# Patient Record
Sex: Male | Born: 1970 | Race: Black or African American | Hispanic: No | Marital: Married | Smoking: Former smoker
Health system: Southern US, Community
[De-identification: ages and names within clinical notes are randomized; demographics above are authoritative.]

## PROBLEM LIST (undated history)

## (undated) DIAGNOSIS — M545 Low back pain, unspecified: Secondary | ICD-10-CM

## (undated) DIAGNOSIS — I10 Essential (primary) hypertension: Secondary | ICD-10-CM

## (undated) DIAGNOSIS — M1990 Unspecified osteoarthritis, unspecified site: Secondary | ICD-10-CM

## (undated) HISTORY — DX: Low back pain, unspecified: M54.50

## (undated) HISTORY — DX: Unspecified osteoarthritis, unspecified site: M19.90

---

## 1998-05-26 ENCOUNTER — Emergency Department (HOSPITAL_COMMUNITY): Admission: EM | Admit: 1998-05-26 | Discharge: 1998-05-26 | Payer: Self-pay | Admitting: Family Medicine

## 2005-02-05 ENCOUNTER — Emergency Department (HOSPITAL_COMMUNITY): Admission: EM | Admit: 2005-02-05 | Discharge: 2005-02-05 | Payer: Self-pay | Admitting: Emergency Medicine

## 2006-09-06 ENCOUNTER — Emergency Department (HOSPITAL_COMMUNITY): Admission: EM | Admit: 2006-09-06 | Discharge: 2006-09-06 | Payer: Self-pay | Admitting: Emergency Medicine

## 2009-02-07 ENCOUNTER — Emergency Department (HOSPITAL_COMMUNITY): Admission: EM | Admit: 2009-02-07 | Discharge: 2009-02-07 | Payer: Self-pay | Admitting: Family Medicine

## 2012-04-05 ENCOUNTER — Emergency Department (HOSPITAL_COMMUNITY)
Admission: EM | Admit: 2012-04-05 | Discharge: 2012-04-05 | Disposition: A | Discharge status: Home / Self Care | Payer: Self-pay | Attending: Emergency Medicine | Admitting: Emergency Medicine

## 2012-04-05 ENCOUNTER — Encounter (HOSPITAL_COMMUNITY): Payer: Self-pay | Admitting: Emergency Medicine

## 2012-04-05 ENCOUNTER — Emergency Department (HOSPITAL_COMMUNITY): Payer: Self-pay

## 2012-04-05 DIAGNOSIS — S39012A Strain of muscle, fascia and tendon of lower back, initial encounter: Secondary | ICD-10-CM

## 2012-04-05 DIAGNOSIS — F17200 Nicotine dependence, unspecified, uncomplicated: Secondary | ICD-10-CM | POA: Insufficient documentation

## 2012-04-05 DIAGNOSIS — M545 Low back pain, unspecified: Secondary | ICD-10-CM | POA: Insufficient documentation

## 2012-04-05 DIAGNOSIS — S335XXA Sprain of ligaments of lumbar spine, initial encounter: Secondary | ICD-10-CM | POA: Insufficient documentation

## 2012-04-05 DIAGNOSIS — X58XXXA Exposure to other specified factors, initial encounter: Secondary | ICD-10-CM | POA: Insufficient documentation

## 2012-04-05 MED ORDER — IBUPROFEN 600 MG PO TABS: 600.0000 mg | ORAL_TABLET | Freq: Four times a day (QID) | ORAL | Status: AC | PRN

## 2012-04-05 MED ORDER — HYDROCODONE-ACETAMINOPHEN 5-500 MG PO TABS: 1.0000 | ORAL_TABLET | Freq: Four times a day (QID) | ORAL | Status: AC | PRN

## 2012-04-05 MED ORDER — IBUPROFEN 200 MG PO TABS
600.0000 mg | ORAL_TABLET | Freq: Once | ORAL | Status: AC
  Administered 2012-04-05: 600 mg via ORAL
  Filled 2012-04-05: qty 3

## 2012-04-05 NOTE — ED Notes (Signed)
Pt c/o of lower back. States that he was going down the stairs when he dropped his keys, bent over to pick them up and heard a loud pop. Took tylenol with no relief.

## 2012-04-05 NOTE — Discharge Instructions (Signed)
Take motrin as need.  You may take vicodin as need for pain. No driving when taking vicodin. Also, do not take tylenol or acetaminophen containing medication when taking vicodin. Avoid bending at waist or heavy lifting more than 20 lbs for the next week. Try heat/heating pad to sore area. Follow up with primary care doctor in coming week for recheck - discuss referral to back specialist and/or further workup if symptoms fail to improve/resolve. Return to ER if worse, leg numbness/weakness, intractable pain, other concern.     Back Pain, Adult Low back pain is very common. About 1 in 5 people have back pain.The cause of low back pain is rarely dangerous. The pain often gets better over time.About half of people with a sudden onset of back pain feel better in just 2 weeks. About 8 in 10 people feel better by 6 weeks.  CAUSES Some common causes of back pain include:  Strain of the muscles or ligaments supporting the spine.   Wear and tear (degeneration) of the spinal discs.   Arthritis.   Direct injury to the back.  DIAGNOSIS Most of the time, the direct cause of low back pain is not known.However, back pain can be treated effectively even when the exact cause of the pain is unknown.Answering your caregiver's questions about your overall health and symptoms is one of the most accurate ways to make sure the cause of your pain is not dangerous. If your caregiver needs more information, he or she may order lab work or imaging tests (X-rays or MRIs).However, even if imaging tests show changes in your back, this usually does not require surgery. HOME CARE INSTRUCTIONS For many people, back pain returns.Since low back pain is rarely dangerous, it is often a condition that people can learn to [facility] their own.   Remain active. It is stressful on the back to sit or stand in one place. Do not sit, drive, or stand in one place for more than 30 minutes at a time. Take short walks on level surfaces  as soon as pain allows.Try to increase the length of time you walk each day.   Do not stay in bed.Resting more than 1 or 2 days can delay your recovery.   Do not avoid exercise or work.Your body is made to move.It is not dangerous to be active, even though your back may hurt.Your back will likely heal faster if you return to being active before your pain is gone.   Pay attention to your body when you bend and lift. Many people have less discomfortwhen lifting if they bend their knees, keep the load close to their bodies,and avoid twisting. Often, the most comfortable positions are those that put less stress on your recovering back.   Find a comfortable position to sleep. Use a firm mattress and lie on your side with your knees slightly bent. If you lie on your back, put a pillow under your knees.   Only take over-the-counter or prescription medicines as directed by your caregiver. Over-the-counter medicines to reduce pain and inflammation are often the most helpful.Your caregiver may prescribe muscle relaxant drugs.These medicines help dull your pain so you can more quickly return to your normal activities and healthy exercise.   Put ice on the injured area.   Put ice in a plastic bag.   Place a towel between your skin and the bag.   Leave the ice on for 15 to 20 minutes, 3 to 4 times a day for the first  2 to 3 days. After that, ice and heat may be alternated to reduce pain and spasms.   Ask your caregiver about trying back exercises and gentle massage. This may be of some benefit.   Avoid feeling anxious or stressed.Stress increases muscle tension and can worsen back pain.It is important to recognize when you are anxious or stressed and learn ways to manage it.Exercise is a great option.  SEEK MEDICAL CARE IF:  You have pain that is not relieved with rest or medicine.   You have pain that does not improve in 1 week.   You have new symptoms.   You are generally not feeling  well.  SEEK IMMEDIATE MEDICAL CARE IF:   You have pain that radiates from your back into your legs.   You develop new bowel or bladder control problems.   You have unusual weakness or numbness in your arms or legs.   You develop nausea or vomiting.   You develop abdominal pain.   You feel faint.  Document Released: 09/28/2005 Document Revised: 09/17/2011 Document Reviewed: 02/16/2011 [facility] Patient Information 2012 [place], [place].      Lumbosacral Strain Lumbosacral strain is one of the most common causes of back pain. There are many causes of back pain. Most are not serious conditions. CAUSES  Your backbone (spinal column) is made up of 24 main vertebral bodies, the sacrum, and the coccyx. These are held together by muscles and tough, fibrous tissue (ligaments). Nerve roots pass through the openings between the vertebrae. A sudden move or injury to the back may cause injury to, or pressure on, these nerves. This may result in localized back pain or pain movement (radiation) into the buttocks, down the leg, and into the foot. Sharp, shooting pain from the buttock down the back of the leg (sciatica) is frequently associated with a ruptured (herniated) disk. Pain may be caused by muscle spasm alone. Your caregiver can often find the cause of your pain by the details of your symptoms and an exam. In some cases, you may need tests (such as X-rays). Your caregiver will work with you to decide if any tests are needed based on your specific exam. HOME CARE INSTRUCTIONS   Avoid an underactive lifestyle. Active exercise, as directed by your caregiver, is your greatest weapon against back pain.   Avoid hard physical activities (tennis, racquetball, waterskiing) if you are not in proper physical condition for it. This may aggravate or create problems.   If you have a back problem, avoid sports requiring sudden body movements. Swimming and walking are generally safer activities.   Maintain  good posture.   Avoid becoming overweight (obese).   Use bed rest for only the most extreme, sudden (acute) episode. Your caregiver will help you determine how much bed rest is necessary.   For acute conditions, you may put ice on the injured area.   Put ice in a plastic bag.   Place a towel between your skin and the bag.   Leave the ice on for 15 to 20 minutes at a time, every 2 hours, or as needed.   After you are improved and more active, it may help to apply heat for 30 minutes before activities.  See your caregiver if you are having pain that lasts longer than expected. Your caregiver can advise appropriate exercises or therapy if needed. With conditioning, most back problems can be avoided. SEEK IMMEDIATE MEDICAL CARE IF:   You have numbness, tingling, weakness, or problems with the  use of your arms or legs.   You experience severe back pain not relieved with medicines.   There is a change in bowel or bladder control.   You have increasing pain in any area of the body, including your belly (abdomen).   You notice shortness of breath, dizziness, or feel faint.   You feel sick to your stomach (nauseous), are throwing up (vomiting), or become sweaty.   You notice discoloration of your toes or legs, or your feet get very cold.   Your back pain is getting worse.   You have a fever.  MAKE SURE YOU:   Understand these instructions.   Will watch your condition.   Will get help right away if you are not doing well or get worse.  Document Released: 07/08/2005 Document Revised: 09/17/2011 Document Reviewed: 12/28/2008 [facility] Patient Information 2012 [place], [place].     Degenerative Disc Disease Degenerative disc disease is a condition caused by the changes that occur in the cushions of the backbone (spinal discs) as you grow older. Spinal discs are soft and compressible discs located between the bones of the spine (vertebrae). They act like shock absorbers. Degenerative  disc disease can affect the wholespine. However, the neck and lower back are most commonly affected. Many changes can occur in the spinal discs with aging, such as:  The spinal discs may dry and shrink.   Small tears may occur in the tough, outer covering of the disc (annulus).   The disc space may become smaller due to loss of water.   Abnormal growths in the bone (spurs) may occur. This can put pressure on the nerve roots exiting the spinal canal, causing pain.   The spinal canal may become narrowed.  CAUSES  Degenerative disc disease is a condition caused by the changes that occur in the spinal discs with aging. The exact cause is not known, but there is a genetic basis for many patients. Degenerative changes can occur due to loss of fluid in the disc. This makes the disc thinner and reduces the space between the backbones. Small cracks can develop in the outer layer of the disc. This can lead to the breakdown of the disc. You are more likely to get degenerative disc disease if you are overweight. Smoking cigarettes and doing heavy work such as weightlifting can also increase your risk of this condition. Degenerative changes can start after a sudden injury. Growth of bone spurs can compress the nerve roots and cause pain.  SYMPTOMS  The symptoms vary from person to person. Some people may have no pain, while others have severe pain. The pain may be so severe that it can limit your activities. The location of the pain depends on the part of your backbone that is affected. You will have neck or arm pain if a disc in the neck area is affected. You will have pain in your back, buttocks, or legs if a disc in the lower back is affected. The pain becomes worse while bending, reaching up, or with twisting movements. The pain may start gradually and then get worse as time passes. It may also start after a major or minor injury. You may feel numbness or tingling in the arms or legs.  DIAGNOSIS  Your  caregiver will ask you about your symptoms and about activities or habits that may cause the pain. He or she may also ask about any injuries, diseases, ortreatments you have had earlier. Your caregiver will examine you to check for  the range of movement that is possible in the affected area, to check for strength in your extremities, and to check for sensation in the areas of the arms and legs supplied by different nerve roots. An X-ray of the spine may be taken. Your caregiver may suggest other imaging tests, such as a computerized magnetic scan (MRI), if needed.  TREATMENT  Treatment includes rest, modifying your activities, and applying ice and heat. Your caregiver may prescribe medicines to reduce your pain and may ask you to do some exercises to strengthen your back. In some cases, you may need surgery. You and your caregiver will decide on the treatment that is best for you. HOME CARE INSTRUCTIONS   Follow proper lifting and walking techniques as advised by your caregiver.   Maintain good posture.   Exercise regularly as advised.   Perform relaxation exercises.   Change your sitting, standing, and sleeping habits as advised. Change positions frequently.   Lose weight as advised.   Stop smoking if you smoke.   Wear supportive footwear.  SEEK MEDICAL CARE IF:  The pain does not go away within 1 to 4 weeks. SEEK IMMEDIATE MEDICAL CARE IF:   The pain is severe.   You notice weakness in your arms, hands, or legs.   You begin to lose control of your bladder or bowel.  MAKE SURE YOU:   Understand these instructions.   Will watch your condition.   Will get help right away if you are not doing well or get worse.  Document Released: 07/26/2007 Document Revised: 09/17/2011 Document Reviewed: 07/26/2007 [facility] Patient Information 2012 [place], [place].

## 2012-04-05 NOTE — ED Provider Notes (Signed)
History     CSN: [id number]  Arrival date & time 04/05/12  7829   First MD Initiated Contact with Patient 04/05/12 [id number]      Chief Complaint  Patient presents with  . Back Pain    (Consider location/radiation/quality/duration/timing/severity/associated sxs/prior treatment) Patient is a 41 y.o. male presenting with back pain. The history is provided by the patient.  Back Pain  Pertinent negatives include no fever, no numbness and no weakness.  pt c/o low back pain. Constant, dull, non radiating. Worse w bending/turning movements. States he bent over yesterday to pick up something from floor, felt a pop/pull in low back, acute onset low back pain. Hx intermittent low back pain in past but denies chronic back pain. No hx ddd. No radicular pain or leg pain. No numbness or weakness. Ambulatory since. No fever or chills. No gi or gu c/o.   History reviewed. No pertinent past medical history.  History reviewed. No pertinent past surgical history.  History reviewed. No pertinent family history.  History  Substance Use Topics  . Smoking status: Current Everyday Smoker    Types: Cigarettes  . Smokeless tobacco: Not on file  . Alcohol Use: Yes      Review of Systems  Constitutional: Negative for fever.  HENT: Negative for neck pain.   Musculoskeletal: Positive for back pain.  Neurological: Negative for weakness and numbness.    Allergies  Review of patient's allergies indicates no known allergies.  Home Medications   Current Outpatient Rx  Name Route Sig Dispense Refill  . MUSCLE RUB 10-15 % EX CREA Topical Apply 1 application topically as needed. Apply to affected areas on back as needed      BP 124/94  Pulse 87  Temp 97.8 F (36.6 C) (Oral)  SpO2 100%  Physical Exam  Nursing note and vitals reviewed. Constitutional: He is oriented to person, place, and time. He appears well-developed and well-nourished. No distress.  HENT:  Head: Atraumatic.  Eyes: Conjunctivae  are normal.  Neck: Neck supple. No tracheal deviation present.  Cardiovascular: Normal rate.   Pulmonary/Chest: Effort normal. No accessory muscle usage. No respiratory distress.  Abdominal: Soft. He exhibits no distension and no mass. There is no tenderness.  Genitourinary:       No cva tenderness  Musculoskeletal: Normal range of motion. He exhibits no edema and no tenderness.       Diffuse lumbar tenderness, otherwise CTLS spine, non tender, aligned, no step off.   Neurological: He is alert and oriented to person, place, and time. He displays normal reflexes.       Motor intact bil. Steady gait.   Skin: Skin is warm and dry.  Psychiatric: He has a normal mood and affect.    ED Course  Procedures (including critical care time)   Dg Lumbar Spine Complete  04/05/2012  *RADIOLOGY REPORT*  Clinical Data: Low back pain right leg pain  LUMBAR SPINE - COMPLETE 4+ VIEW  Comparison: None.  Findings: Five views of the lumbar spine submitted.  No acute fracture or subluxation.  Alignment and vertebral height are preserved.  Mild disc space flattening at L4-L5 and L5 S1 level.  IMPRESSION: No acute fracture or subluxation.  Mild disc space flattening at L4- L5 and L5 S1 level.  Original Report Authenticated By: Natasha Mead, M.D.      MDM  Pt drove self. Motrin po.   Discussed xrays w pt.   Pt comfortable on recheck.       Caryn Bee  Cheri Rous, MD 04/05/12 [phone]

## 2015-09-17 ENCOUNTER — Encounter (HOSPITAL_COMMUNITY): Payer: Self-pay

## 2015-09-17 ENCOUNTER — Emergency Department (HOSPITAL_COMMUNITY): Payer: Self-pay

## 2015-09-17 ENCOUNTER — Emergency Department (HOSPITAL_COMMUNITY)
Admission: EM | Admit: 2015-09-17 | Discharge: 2015-09-17 | Disposition: A | Discharge status: Home / Self Care | Payer: Self-pay | Attending: Emergency Medicine | Admitting: Emergency Medicine

## 2015-09-17 DIAGNOSIS — F17210 Nicotine dependence, cigarettes, uncomplicated: Secondary | ICD-10-CM | POA: Insufficient documentation

## 2015-09-17 DIAGNOSIS — M549 Dorsalgia, unspecified: Secondary | ICD-10-CM | POA: Insufficient documentation

## 2015-09-17 DIAGNOSIS — R509 Fever, unspecified: Secondary | ICD-10-CM | POA: Insufficient documentation

## 2015-09-17 DIAGNOSIS — R1012 Left upper quadrant pain: Secondary | ICD-10-CM | POA: Insufficient documentation

## 2015-09-17 LAB — URINALYSIS, ROUTINE W REFLEX MICROSCOPIC
BILIRUBIN URINE: NEGATIVE
GLUCOSE, UA: NEGATIVE mg/dL
KETONES UR: NEGATIVE mg/dL
LEUKOCYTES UA: NEGATIVE
NITRITE: NEGATIVE
PH: 7 (ref 5.0–8.0)
PROTEIN: NEGATIVE mg/dL
Specific Gravity, Urine: 1.012 (ref 1.005–1.030)

## 2015-09-17 LAB — COMPREHENSIVE METABOLIC PANEL
ALBUMIN: 4.7 g/dL (ref 3.5–5.0)
ALK PHOS: 63 U/L (ref 38–126)
ALT: 15 U/L — AB (ref 17–63)
AST: 20 U/L (ref 15–41)
Anion gap: 6 (ref 5–15)
BILIRUBIN TOTAL: 0.8 mg/dL (ref 0.3–1.2)
BUN: 12 mg/dL (ref 6–20)
CO2: 29 mmol/L (ref 22–32)
CREATININE: 0.95 mg/dL (ref 0.61–1.24)
Calcium: 9.7 mg/dL (ref 8.9–10.3)
Chloride: 103 mmol/L (ref 101–111)
GFR calc Af Amer: >60 mL/min (ref >60)
GLUCOSE: 103 mg/dL — AB (ref 65–99)
Potassium: 4.4 mmol/L (ref 3.5–5.1)
Sodium: 138 mmol/L (ref 135–145)
TOTAL PROTEIN: 7.8 g/dL (ref 6.5–8.1)

## 2015-09-17 LAB — CBC
HCT: 42 % (ref 39.0–52.0)
Hemoglobin: 14.5 g/dL (ref 13.0–17.0)
MCH: 32.6 pg (ref 26.0–34.0)
MCHC: 34.5 g/dL (ref 30.0–36.0)
MCV: 94.4 fL (ref 78.0–100.0)
PLATELETS: 287 10*3/uL (ref 150–400)
RBC: 4.45 MIL/uL (ref 4.22–5.81)
RDW: 12.7 % (ref 11.5–15.5)
WBC: 5 10*3/uL (ref 4.0–10.5)

## 2015-09-17 LAB — URINE MICROSCOPIC-ADD ON

## 2015-09-17 LAB — LIPASE, BLOOD: Lipase: 21 U/L (ref 11–51)

## 2015-09-17 MED ORDER — CIPROFLOXACIN HCL 500 MG PO TABS: 500.0000 mg | ORAL_TABLET | Freq: Two times a day (BID) | ORAL | Status: DC

## 2015-09-17 MED ORDER — HYDROMORPHONE HCL 1 MG/ML IJ SOLN
1.0000 mg | Freq: Once | INTRAMUSCULAR | Status: AC
  Administered 2015-09-17: 1 mg via INTRAVENOUS
  Filled 2015-09-17: qty 1

## 2015-09-17 MED ORDER — TRAMADOL HCL 50 MG PO TABS: 50.0000 mg | ORAL_TABLET | Freq: Four times a day (QID) | ORAL | Status: DC | PRN

## 2015-09-17 NOTE — Progress Notes (Addendum)
CM spoke with [place] who [place].  CM discussed and provided written information for uninsured accepting pcps, discussed the importance of pcp vs EDP services for f/u care, www.needymeds.org, www.goodrx.com, discounted pharmacies and other [place] resources such as [facility] , [facility], affordable care act, financial assistance, uninsured dental services, [place] med assist, DSS and  health department  Reviewed resources for [place] uninsured accepting pcps like [facility], [facility] medicine [facility] [address], [facility], [facility], local urgent care centers, [facility], [facility], general medical clinics, [facility], [facility] plus others, medication resources, [facility] out patient pharmacies and housing Pt voiced understanding and appreciation of resources provided   Provided [facility] contact information Left items with male family member with a male baby at the bedside Brother Reggie came to visit and left   Entered in d/c instructions  Please use the resources provided to you in emergency room by case manager to assist with doctor for follow up As needed These [place] uninsured resources provide possible primary care providers, resources for discounted medications, housing, dental resources, affordable care act information, plus other resources for [place]

## 2015-09-17 NOTE — ED Notes (Signed)
Patient c/o upper abd pain that radiates to the back x3 weeks.  Patient states that HA pain began 2 days ago and brought patient in today.  Patient states has been having night sweats and fevers at night x3 days, denies cough/cold symptoms.

## 2015-09-17 NOTE — ED Notes (Signed)
Pt with abdominal pain to left lower abdomen radiating to center back.  No change in urination.  Headache.  No vomiting.  Unknown for fever.  Pain x 3 weeks but worsening in last few days.

## 2015-09-17 NOTE — ED Notes (Signed)
Patient d/c'd self care.  F/U and medications reviewed.  Patient verbalized understanding. 

## 2015-09-17 NOTE — ED Provider Notes (Signed)
CSN: [id number]     Arrival date & time 09/17/15  1123 History   First MD Initiated Contact with Patient 09/17/15 1228     Chief Complaint  Patient presents with  . Abdominal Pain  . Back Pain    Patient is a 44 y.o. male presenting with abdominal pain and back pain. The history is provided by the patient.  Abdominal Pain Pain location:  LUQ Pain quality: aching and sharp   Pain radiates to:  L flank Pain severity:  Moderate Onset quality:  Gradual Duration:  3 weeks Progression:  Worsening Chronicity:  New Relieved by:  Nothing Worsened by:  Nothing tried Associated symptoms: chills and fever   Associated symptoms: no anorexia, no constipation, no cough, no diarrhea, no dysuria and no vomiting   Back Pain Associated symptoms: abdominal pain and fever   Associated symptoms: no dysuria     History reviewed. No pertinent past medical history. History reviewed. No pertinent past surgical history. History reviewed. No pertinent family history. Social History  Substance Use Topics  . Smoking status: Current Every Day Smoker    Types: Cigarettes  . Smokeless tobacco: None  . Alcohol Use: Yes     Comment: social    Review of Systems  Constitutional: Positive for fever and chills. Negative for unexpected weight change.  Respiratory: Negative for cough.   Gastrointestinal: Positive for abdominal pain. Negative for vomiting, diarrhea, constipation and anorexia.  Genitourinary: Negative for dysuria.  Musculoskeletal: Positive for back pain.  All other systems reviewed and are negative.     Allergies  Review of patient's allergies indicates no known allergies.  Home Medications   Prior to Admission medications   Medication Sig Start Date End Date Taking? Authorizing Provider  ciprofloxacin (CIPRO) 500 MG tablet Take 1 tablet (500 mg total) by mouth every 12 (twelve) hours. 09/17/15   Linwood Dibbles, MD  Menthol-Methyl Salicylate (MUSCLE RUB) 10-15 % CREA Apply 1 application  topically as needed. Apply to affected areas on back as needed    Historical Provider, MD  traMADol (ULTRAM) 50 MG tablet Take 1 tablet (50 mg total) by mouth every 6 (six) hours as needed. 09/17/15   Linwood Dibbles, MD   BP 155/96 mmHg  Pulse 54  Temp(Src) 98.2 F (36.8 C) (Oral)  Resp 17  SpO2 97% Physical Exam  Constitutional: He appears well-developed and well-nourished. No distress.  HENT:  Head: Normocephalic and atraumatic.  Right Ear: External ear normal.  Left Ear: External ear normal.  Eyes: Conjunctivae are normal. Right eye exhibits no discharge. Left eye exhibits no discharge. No scleral icterus.  Neck: Neck supple. No tracheal deviation present.  Cardiovascular: Normal rate, regular rhythm and intact distal pulses.   Pulmonary/Chest: Effort normal and breath sounds normal. No stridor. No respiratory distress. He has no wheezes. He has no rales.  Abdominal: Soft. Bowel sounds are normal. He exhibits no distension. There is tenderness. There is CVA tenderness. There is no rebound and no guarding.  Musculoskeletal: He exhibits no edema or tenderness.  Neurological: He is alert. He has normal strength. No cranial nerve deficit (no facial droop, extraocular movements intact, no slurred speech) or sensory deficit. He exhibits normal muscle tone. He displays no seizure activity. Coordination normal.  Skin: Skin is warm and dry. No rash noted.  Psychiatric: He has a normal mood and affect.  Nursing note and vitals reviewed.   ED Course  Procedures (including critical care time) Labs Review Labs Reviewed  COMPREHENSIVE METABOLIC PANEL -  Abnormal; Notable for the following:    Glucose, Bld 103 (*)    ALT 15 (*)    All other components within normal limits  URINALYSIS, ROUTINE W REFLEX MICROSCOPIC (NOT AT [facility]) - Abnormal; Notable for the following:    Hgb urine dipstick SMALL (*)    All other components within normal limits  URINE MICROSCOPIC-ADD ON - Abnormal; Notable for the  following:    Squamous Epithelial / LPF 0-5 (*)    Bacteria, UA RARE (*)    All other components within normal limits  URINE CULTURE  LIPASE, BLOOD  CBC    Imaging Review Ct Abdomen Pelvis Wo Contrast  09/17/2015  CLINICAL DATA:  Upper abdominal pain for 3 weeks EXAM: CT ABDOMEN AND PELVIS WITHOUT CONTRAST TECHNIQUE: Multidetector CT imaging of the abdomen and pelvis was performed following the standard protocol without IV contrast. COMPARISON:  None. FINDINGS: Lung bases are free of acute infiltrate or sizable effusion. The liver, gallbladder, spleen, adrenal glands and pancreas are within normal limits. The kidneys are well visualized bilaterally. No renal calculi or obstructive changes are seen. A small 1 cm hypodensity is noted within the left kidney consistent with a renal cyst. The appendix is well visualized. The bladder is well distended. No pelvic mass lesion or free pelvic fluid is seen. No significant lymphadenopathy is noted. The bony structures are within normal limits. IMPRESSION: Small left renal cyst.  No other focal abnormality is noted. Electronically Signed   By: Alcide CleverMark  Lukens M.D.   On: 09/17/2015 14:17   I have personally reviewed and evaluated these images and lab results as part of my medical decision-making.    MDM   Final diagnoses:  Left upper quadrant pain    Pt has no acute findings on CT scan to account for his pain.  Labs are normal with the exception of mild abnormalities in the urine.  Not definitive for infection.  However with absence of other findings will rx abx, pain meds.  Follow up with a PCP    Linwood DibblesJon Carey Lafon, MD 09/17/15 [phone]

## 2015-09-17 NOTE — Discharge Instructions (Signed)

## 2015-09-17 NOTE — ED Notes (Signed)
Patient transported to CT 

## 2015-09-19 LAB — URINE CULTURE
Culture: NO GROWTH
SPECIAL REQUESTS: NORMAL

## 2016-02-11 ENCOUNTER — Encounter (HOSPITAL_COMMUNITY): Payer: Self-pay | Admitting: Emergency Medicine

## 2016-02-11 ENCOUNTER — Emergency Department (HOSPITAL_COMMUNITY)
Admission: EM | Admit: 2016-02-11 | Discharge: 2016-02-11 | Disposition: A | Discharge status: Home / Self Care | Payer: Self-pay | Attending: Emergency Medicine | Admitting: Emergency Medicine

## 2016-02-11 ENCOUNTER — Emergency Department (HOSPITAL_COMMUNITY): Payer: Self-pay

## 2016-02-11 DIAGNOSIS — R319 Hematuria, unspecified: Secondary | ICD-10-CM | POA: Insufficient documentation

## 2016-02-11 DIAGNOSIS — F17210 Nicotine dependence, cigarettes, uncomplicated: Secondary | ICD-10-CM | POA: Insufficient documentation

## 2016-02-11 DIAGNOSIS — R1084 Generalized abdominal pain: Secondary | ICD-10-CM | POA: Insufficient documentation

## 2016-02-11 DIAGNOSIS — M549 Dorsalgia, unspecified: Secondary | ICD-10-CM | POA: Insufficient documentation

## 2016-02-11 LAB — CBC WITH DIFFERENTIAL/PLATELET
Basophils Absolute: 0 10*3/uL (ref 0.0–0.1)
Basophils Relative: 1 %
EOS PCT: 2 %
Eosinophils Absolute: 0.1 10*3/uL (ref 0.0–0.7)
HEMATOCRIT: 42.5 % (ref 39.0–52.0)
Hemoglobin: 14.2 g/dL (ref 13.0–17.0)
LYMPHS ABS: 1.3 10*3/uL (ref 0.7–4.0)
LYMPHS PCT: 27 %
MCH: 32.1 pg (ref 26.0–34.0)
MCHC: 33.4 g/dL (ref 30.0–36.0)
MCV: 95.9 fL (ref 78.0–100.0)
MONO ABS: 0.4 10*3/uL (ref 0.1–1.0)
Monocytes Relative: 8 %
NEUTROS ABS: 2.9 10*3/uL (ref 1.7–7.7)
Neutrophils Relative %: 62 %
PLATELETS: 269 10*3/uL (ref 150–400)
RBC: 4.43 MIL/uL (ref 4.22–5.81)
RDW: 12.4 % (ref 11.5–15.5)
WBC: 4.6 10*3/uL (ref 4.0–10.5)

## 2016-02-11 LAB — COMPREHENSIVE METABOLIC PANEL
ALBUMIN: 4.1 g/dL (ref 3.5–5.0)
ALT: 15 U/L — ABNORMAL LOW (ref 17–63)
AST: 21 U/L (ref 15–41)
Alkaline Phosphatase: 54 U/L (ref 38–126)
Anion gap: 8 (ref 5–15)
BUN: 12 mg/dL (ref 6–20)
CHLORIDE: 105 mmol/L (ref 101–111)
CO2: 25 mmol/L (ref 22–32)
Calcium: 9.4 mg/dL (ref 8.9–10.3)
Creatinine, Ser: 0.84 mg/dL (ref 0.61–1.24)
GFR calc Af Amer: >60 mL/min (ref >60)
GFR calc non Af Amer: >60 mL/min (ref >60)
GLUCOSE: 98 mg/dL (ref 65–99)
POTASSIUM: 4.4 mmol/L (ref 3.5–5.1)
Sodium: 138 mmol/L (ref 135–145)
Total Bilirubin: 0.9 mg/dL (ref 0.3–1.2)
Total Protein: 6.9 g/dL (ref 6.5–8.1)

## 2016-02-11 LAB — URINE MICROSCOPIC-ADD ON: WBC UA: NONE SEEN WBC/hpf (ref 0–5)

## 2016-02-11 LAB — URINALYSIS, ROUTINE W REFLEX MICROSCOPIC
Bilirubin Urine: NEGATIVE
GLUCOSE, UA: NEGATIVE mg/dL
Ketones, ur: NEGATIVE mg/dL
Leukocytes, UA: NEGATIVE
Nitrite: NEGATIVE
PH: 6.5 (ref 5.0–8.0)
PROTEIN: NEGATIVE mg/dL
SPECIFIC GRAVITY, URINE: 1.014 (ref 1.005–1.030)

## 2016-02-11 LAB — LIPASE, BLOOD: Lipase: 16 U/L (ref 11–51)

## 2016-02-11 MED ORDER — PROMETHAZINE HCL 25 MG PO TABS: 25.0000 mg | ORAL_TABLET | Freq: Four times a day (QID) | ORAL | Status: DC | PRN

## 2016-02-11 MED ORDER — IOPAMIDOL (ISOVUE-300) INJECTION 61%
INTRAVENOUS | Status: AC
  Filled 2016-02-11: qty 100

## 2016-02-11 MED ORDER — IOPAMIDOL (ISOVUE-300) INJECTION 61%
INTRAVENOUS | Status: AC
  Administered 2016-02-11: 100 mL
  Filled 2016-02-11: qty 100

## 2016-02-11 MED ORDER — HYDROMORPHONE HCL 1 MG/ML IJ SOLN
1.0000 mg | Freq: Once | INTRAMUSCULAR | Status: AC
Start: 2016-02-11 — End: 2016-02-11
  Administered 2016-02-11: 1 mg via INTRAVENOUS
  Filled 2016-02-11: qty 1

## 2016-02-11 MED ORDER — HYDROCODONE-ACETAMINOPHEN 5-325 MG PO TABS: 1.0000 | ORAL_TABLET | ORAL | Status: DC | PRN

## 2016-02-11 MED ORDER — MORPHINE SULFATE (PF) 4 MG/ML IV SOLN
4.0000 mg | Freq: Once | INTRAVENOUS | Status: AC
  Administered 2016-02-11: 4 mg via INTRAVENOUS
  Filled 2016-02-11: qty 1

## 2016-02-11 MED ORDER — SODIUM CHLORIDE 0.9 % IV BOLUS (SEPSIS)
1000.0000 mL | Freq: Once | INTRAVENOUS | Status: AC
  Administered 2016-02-11: 1000 mL via INTRAVENOUS

## 2016-02-11 MED ORDER — ONDANSETRON HCL 4 MG/2ML IJ SOLN
4.0000 mg | Freq: Once | INTRAMUSCULAR | Status: AC
  Administered 2016-02-11: 4 mg via INTRAVENOUS
  Filled 2016-02-11: qty 2

## 2016-02-11 NOTE — ED Notes (Signed)
Pt reporting abdominal pain 10/10. RN notified.

## 2016-02-11 NOTE — ED Provider Notes (Signed)
CSN: [id number]     Arrival date & time 02/11/16  [id number] History   First MD Initiated Contact with Patient 02/11/16 [phone]     Chief Complaint  Patient presents with  . Abdominal Pain  . Back Pain   PT SAID THAT HE HAS LUQ ABD PAIN AND BACK PAIN.  HE SAID HE HAD A SIMILAR EPISODE ABOUT 6 MONTHS AGO.  HE SAID THAT HE WAS TOLD HE HAD SOMETHING WRONG WITH HIS KIDNEY.   HE WAS HERE IN December AND HAD A CT THEN WHICH WAS ESSENTIALLY NORMAL.  THE PT DENIES FEVER OR VOMITING.  (Consider location/radiation/quality/duration/timing/severity/associated sxs/prior Treatment) The history is provided by the patient.    History reviewed. No pertinent past medical history. History reviewed. No pertinent past surgical history. No family history on file. Social History  Substance Use Topics  . Smoking status: Current Every Day Smoker -- 0.50 packs/day    Types: Cigarettes  . Smokeless tobacco: None  . Alcohol Use: Yes     Comment: every day    Review of Systems  Gastrointestinal: Positive for abdominal pain.  All other systems reviewed and are negative.     Allergies  Review of patient's allergies indicates no known allergies.  Home Medications   Prior to Admission medications   Medication Sig Start Date End Date Taking? Authorizing Provider  ciprofloxacin (CIPRO) 500 MG tablet Take 1 tablet (500 mg total) by mouth every 12 (twelve) hours. Patient not taking: Reported on 02/11/2016 09/17/15   Linwood DibblesJon Knapp, MD  HYDROcodone-acetaminophen (NORCO/VICODIN) 5-325 MG tablet Take 1 tablet by mouth every 4 (four) hours as needed. 02/11/16   Jacalyn LefevreJulie Onyx Edgley, MD  Menthol-Methyl Salicylate (MUSCLE RUB) 10-15 % CREA Apply 1 application topically as needed. Apply to affected areas on back as needed    Historical Provider, MD  promethazine (PHENERGAN) 25 MG tablet Take 1 tablet (25 mg total) by mouth every 6 (six) hours as needed for nausea or vomiting. 02/11/16   Jacalyn LefevreJulie Rukia Mcgillivray, MD  traMADol (ULTRAM) 50 MG tablet Take  1 tablet (50 mg total) by mouth every 6 (six) hours as needed. Patient not taking: Reported on 02/11/2016 09/17/15   Linwood DibblesJon Knapp, MD   BP 158/101 mmHg  Pulse 63  Temp(Src) 98.2 F (36.8 C) (Oral)  Resp 17  Ht 5\' 9"  (1.753 m)  Wt 150 lb (68.04 kg)  BMI 22.14 kg/m2  SpO2 100% Physical Exam  Constitutional: He is oriented to person, place, and time. He appears well-developed and well-nourished.  HENT:  Head: Normocephalic and atraumatic.  Right Ear: External ear normal.  Left Ear: External ear normal.  Nose: Nose normal.  Mouth/Throat: Oropharynx is clear and moist.  Eyes: Conjunctivae and EOM are normal. Pupils are equal, round, and reactive to light.  Neck: Normal range of motion. Neck supple.  Cardiovascular: Normal rate, regular rhythm, normal heart sounds and intact distal pulses.   Pulmonary/Chest: Effort normal and breath sounds normal.  Abdominal: Bowel sounds are normal. There is generalized tenderness.  Musculoskeletal: Normal range of motion.  Neurological: He is alert and oriented to person, place, and time.  Skin: Skin is warm and dry.  Psychiatric: He has a normal mood and affect. His behavior is normal. Judgment and thought content normal.  Nursing note and vitals reviewed.   ED Course  Procedures (including critical care time) Labs Review Labs Reviewed  COMPREHENSIVE METABOLIC PANEL - Abnormal; Notable for the following:    ALT 15 (*)    All other components within  normal limits  URINALYSIS, ROUTINE W REFLEX MICROSCOPIC (NOT AT [facility]) - Abnormal; Notable for the following:    Hgb urine dipstick MODERATE (*)    All other components within normal limits  URINE MICROSCOPIC-ADD ON - Abnormal; Notable for the following:    Squamous Epithelial / LPF 0-5 (*)    Bacteria, UA RARE (*)    All other components within normal limits  CBC WITH DIFFERENTIAL/PLATELET  LIPASE, BLOOD    Imaging Review Ct Abdomen Pelvis W Contrast  02/11/2016  CLINICAL DATA:  45 year old male  with left abdominal pain and flank pain for 6 months, acutely increasing. No gross hematuria or fever. Initial encounter. EXAM: CT ABDOMEN AND PELVIS WITH CONTRAST TECHNIQUE: Multidetector CT imaging of the abdomen and pelvis was performed using the standard protocol following bolus administration of intravenous contrast. CONTRAST:  100 mL ISOVUE-300 IOPAMIDOL (ISOVUE-300) INJECTION 61% COMPARISON:  Noncontrast CT Abdomen and Pelvis 09/17/2015 FINDINGS: Negative lung bases aside from minor dependent atelectasis. No pericardial or pleural effusion. Lumbosacral junction chronic facet degeneration greater on the right. Mild bilateral hip joint subchondral cysts. No acute osseous abnormality identified. No pelvic free fluid. Unremarkable urinary bladder. Unremarkable rectum. Redundant but otherwise negative sigmoid colon. Mostly decompressed left colon. Negative transverse colon. Mild volume of retained stool in the right colon. Normal appendix (coronal image 59). Negative terminal ileum. No dilated small bowel. Negative stomach and duodenum. No abdominal free air or free fluid. Unchanged small simple fluid densitometry low-density area in the central right hepatic lobe compatible with benign cyst. Otherwise negative liver, gallbladder, spleen, pancreas and adrenal glands. Portal venous system is patent. Major arterial structures are patent in the abdomen and pelvis. No lymphadenopathy identified. Bilateral renal enhancement and contrast excretion within normal limits. Small left renal midpole cortical cyst with simple fluid densitometry. IMPRESSION: Negative CT Abdomen and Pelvis. No clear explanation for left flank pain. Electronically Signed   By: Odessa Fleming M.D.   On: 02/11/2016 14:19   I have personally reviewed and evaluated these images and lab results as part of my medical decision-making.   EKG Interpretation   Date/Time:  Tuesday Feb 11 2016 09:41:52 EDT Ventricular Rate:  64 PR Interval:  153 QRS  Duration: 81 QT Interval:  395 QTC Calculation: 407 R Axis:   135 Text Interpretation:  Right and left arm electrode reversal,  interpretation assumes no reversal Sinus rhythm Consider left ventricular  hypertrophy Probable lateral infarct, age indeterminate Confirmed by  Tanji Storrs MD, Konnor Vondrasek ([id number]) on 02/11/2016 11:28:40 AM      MDM  PT TOLD TO F/U WITH UROLOGY TO EVAL SOURCE OF HEMATURIA.  PT KNOWS TO RETURN IF WORSE. Final diagnoses:  Generalized abdominal pain  Hematuria       Jacalyn Lefevre, MD 02/11/16 1441

## 2016-02-11 NOTE — ED Notes (Signed)
Patient states L abdominal pain and L flank pain.   Patient denies urinary symptoms.   Patient states he has been unable to eat for 2 days.  Denies vomiting and diarrhea, but endorses nausea.   Patient states several months ago he was told "you have a cyst on your kidney".

## 2016-02-11 NOTE — Discharge Instructions (Signed)

## 2017-06-12 IMAGING — CT CT ABD-PELV W/ CM
2 of 5 series · 16 of 46 positions shown, 18 images · IV contrast (iopamidol)
Comparison: Noncontrast CT Abdomen and Pelvis 09/17/2015

CLINICAL DATA: 45-year-old male with left abdominal pain and flank
pain for 6 months, acutely increasing. No gross hematuria or fever.
Initial encounter.

EXAM:
CT ABDOMEN AND PELVIS WITH CONTRAST
TECHNIQUE: Multidetector CT imaging of the abdomen and pelvis was performed
using the standard protocol following bolus administration of
intravenous contrast.
CONTRAST:  100 mL BG3U0X-5HH IOPAMIDOL (BG3U0X-5HH) INJECTION 61%

[Series 2: a/p w/ 5mm · axial · 0.69mm/px · z∈[-986,-581]mm · 13 of 93 slices shown, 15 images]
[im 6/93  soft-tissue]
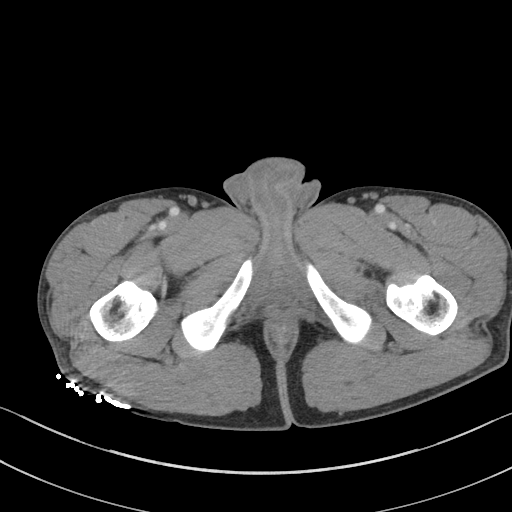
[im 6/93  bone]
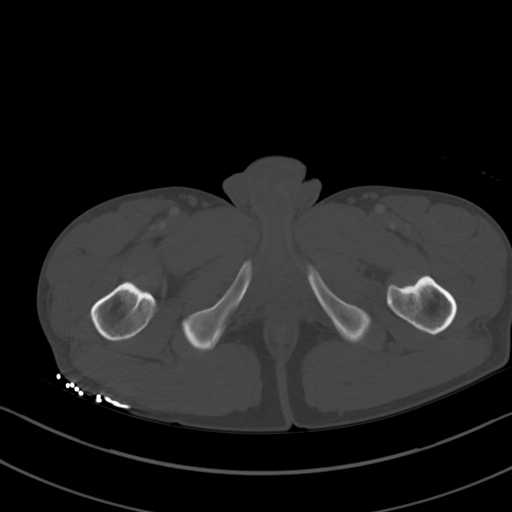
[im 11/93  soft-tissue]
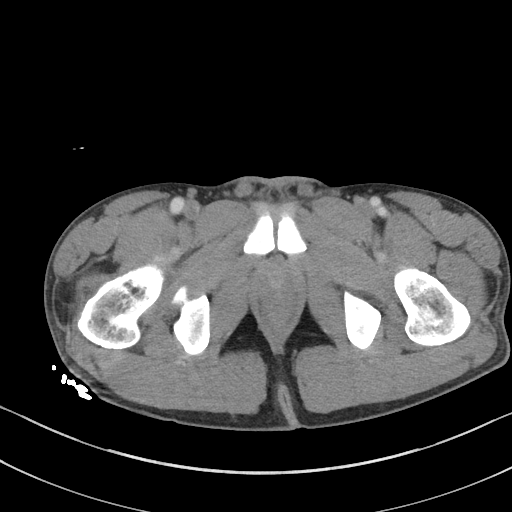
[im 22/93  soft-tissue]
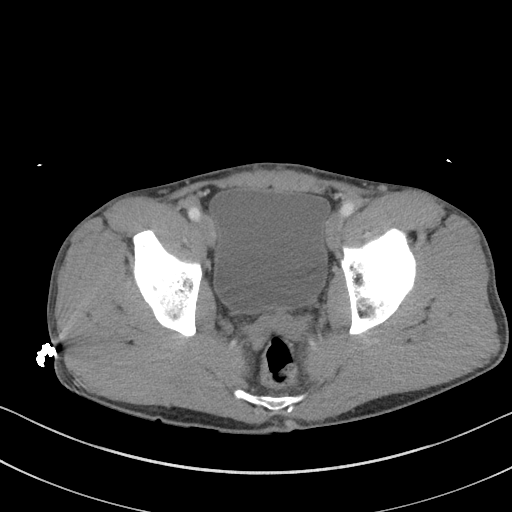
[im 28/93  soft-tissue]
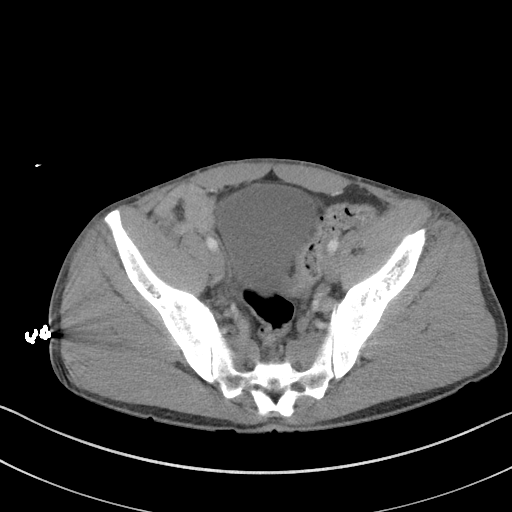
[im 33/93  soft-tissue]
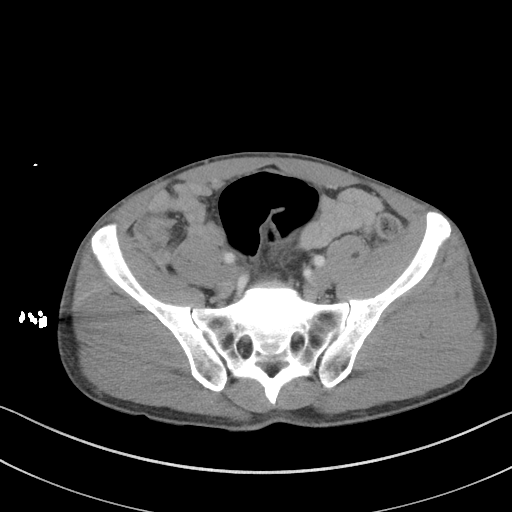
[im 38/93  soft-tissue]
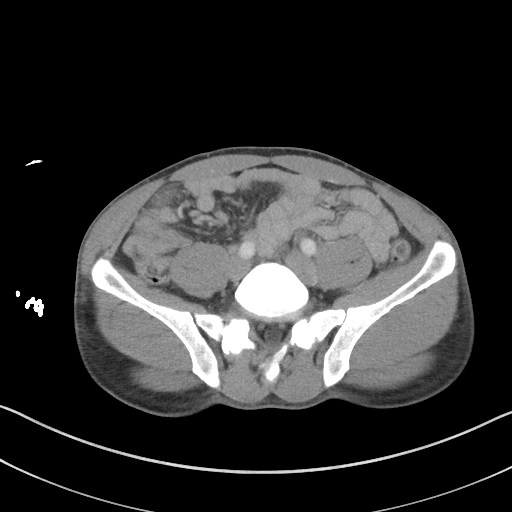
[im 49/93  soft-tissue]
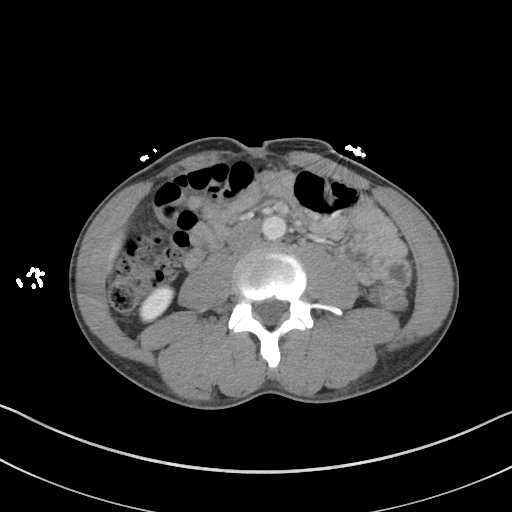
[im 55/93  soft-tissue]
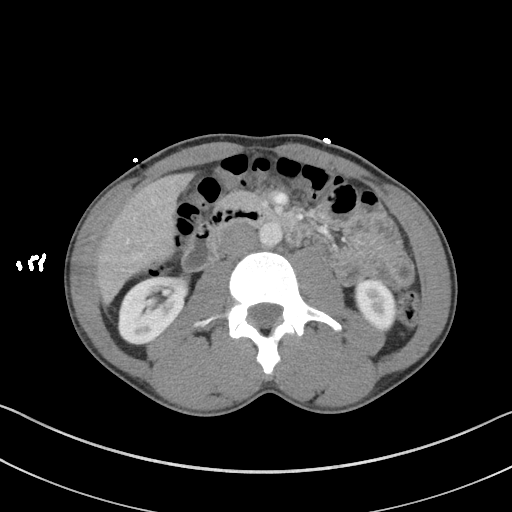
[im 60/93  soft-tissue]
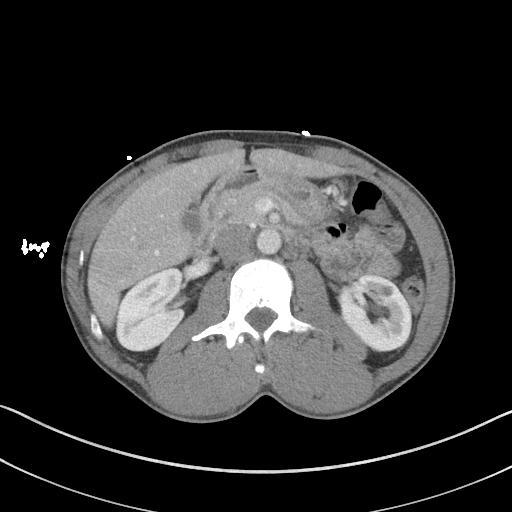
[im 60/93  bone]
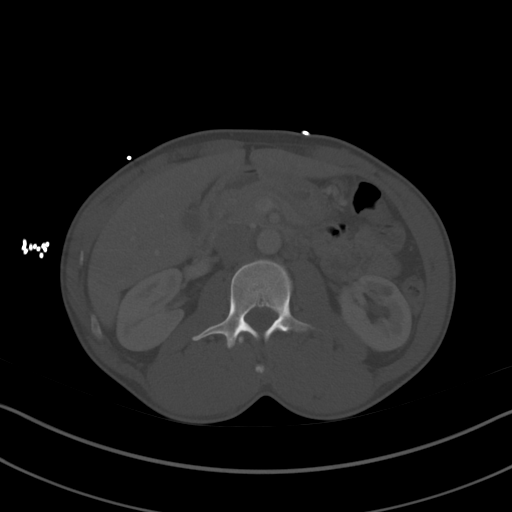
[im 65/93  soft-tissue]
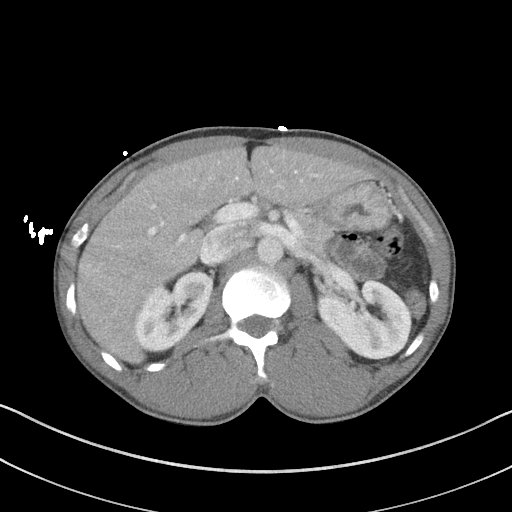
[im 71/93  soft-tissue]
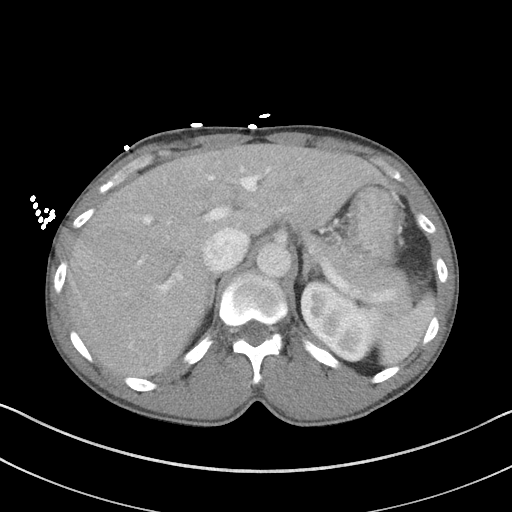
[im 82/93  soft-tissue]
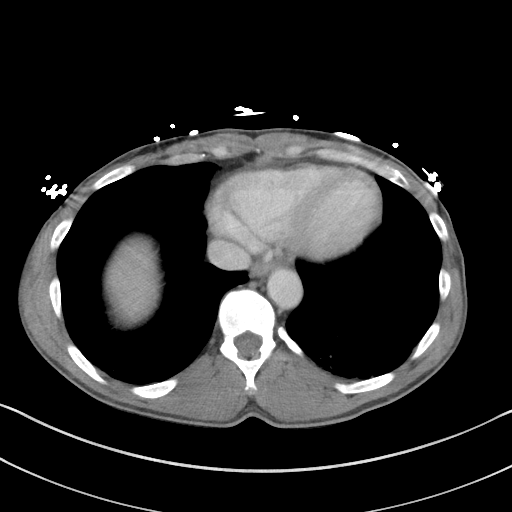
[im 87/93  soft-tissue]
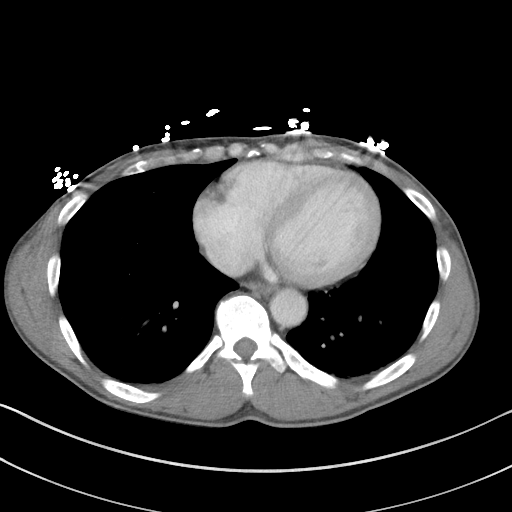

[Series 5: a/p w/ cor · coronal · 0.71mm/px · 3 of 115 slices shown]
[im 39/115  soft-tissue]
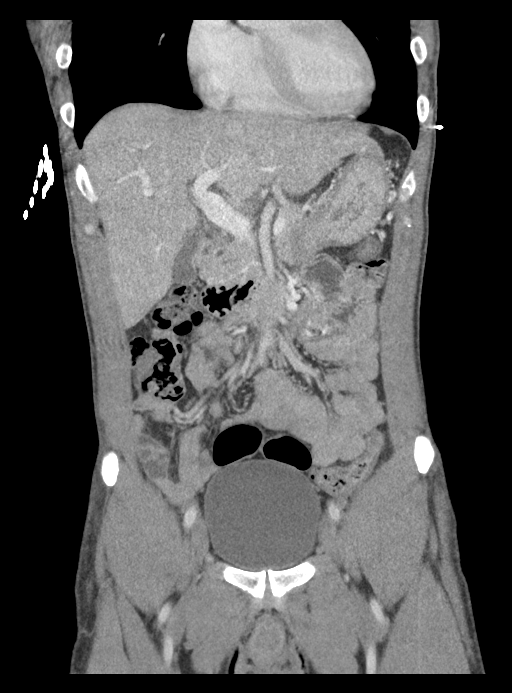
[im 51/115  soft-tissue]
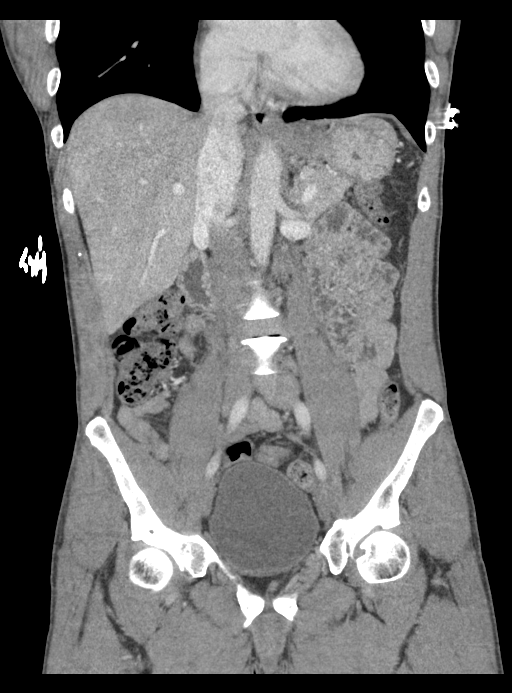
[im 64/115  soft-tissue]
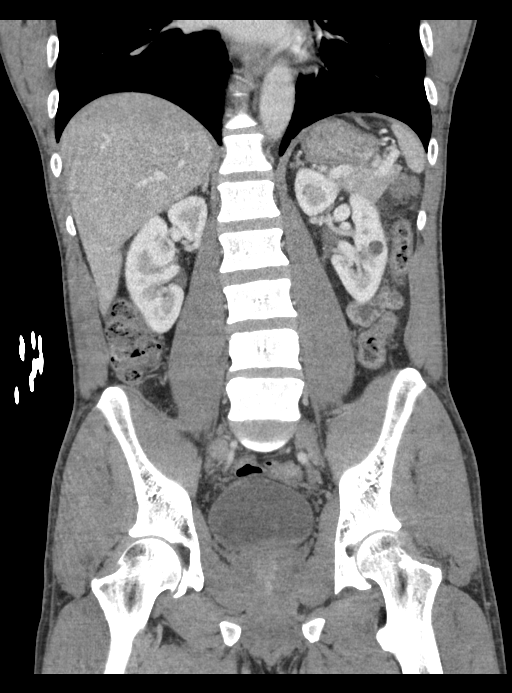

[16 of 46 positions shown; findings below may reference images not displayed]

FINDINGS: Negative lung bases aside from minor dependent atelectasis. No
pericardial or pleural effusion.

Lumbosacral junction chronic facet degeneration greater on the
right. Mild bilateral hip joint subchondral cysts. No acute osseous
abnormality identified.

No pelvic free fluid. Unremarkable urinary bladder. Unremarkable
rectum.

Redundant but otherwise negative sigmoid colon. Mostly decompressed
left colon. Negative transverse colon. Mild volume of retained stool
in the right colon. Normal appendix (coronal image 59). Negative
terminal ileum. No dilated small bowel. Negative stomach and
duodenum.

No abdominal free air or free fluid. Unchanged small simple fluid
densitometry low-density area in the central right hepatic lobe
compatible with benign cyst. Otherwise negative liver, gallbladder,
spleen, pancreas and adrenal glands. Portal venous system is patent.
Major arterial structures are patent in the abdomen and pelvis. No
lymphadenopathy identified. Bilateral renal enhancement and contrast
excretion within normal limits. Small left renal midpole cortical
cyst with simple fluid densitometry.
IMPRESSION: Negative CT Abdomen and Pelvis. No clear explanation for left flank
pain.

## 2017-07-05 ENCOUNTER — Emergency Department (HOSPITAL_COMMUNITY): Payer: Self-pay

## 2017-07-05 ENCOUNTER — Emergency Department (HOSPITAL_COMMUNITY)
Admission: EM | Admit: 2017-07-05 | Discharge: 2017-07-05 | Disposition: A | Discharge status: Home / Self Care | Payer: Self-pay | Attending: Emergency Medicine | Admitting: Emergency Medicine

## 2017-07-05 ENCOUNTER — Encounter (HOSPITAL_COMMUNITY): Payer: Self-pay

## 2017-07-05 DIAGNOSIS — F17210 Nicotine dependence, cigarettes, uncomplicated: Secondary | ICD-10-CM | POA: Insufficient documentation

## 2017-07-05 DIAGNOSIS — Z79899 Other long term (current) drug therapy: Secondary | ICD-10-CM | POA: Insufficient documentation

## 2017-07-05 DIAGNOSIS — M25551 Pain in right hip: Secondary | ICD-10-CM | POA: Insufficient documentation

## 2017-07-05 MED ORDER — KETOROLAC TROMETHAMINE 30 MG/ML IJ SOLN: 30.0000 mg | Freq: Once | INTRAMUSCULAR | Status: DC

## 2017-07-05 MED ORDER — NAPROXEN 375 MG PO TABS: 375.0000 mg | ORAL_TABLET | Freq: Two times a day (BID) | ORAL | 0 refills | Status: DC

## 2017-07-05 MED ORDER — CYCLOBENZAPRINE HCL 10 MG PO TABS: 10.0000 mg | ORAL_TABLET | Freq: Two times a day (BID) | ORAL | 0 refills | Status: DC | PRN

## 2017-07-05 MED ORDER — KETOROLAC TROMETHAMINE 30 MG/ML IJ SOLN
15.0000 mg | Freq: Once | INTRAMUSCULAR | Status: AC
  Administered 2017-07-05: 15 mg via INTRAMUSCULAR
  Filled 2017-07-05: qty 1

## 2017-07-05 MED ORDER — LIDOCAINE-MENTHOL 3.99-1.25 % EX PTCH: 1.0000 | MEDICATED_PATCH | Freq: Every day | CUTANEOUS | 0 refills | Status: DC | PRN

## 2017-07-05 NOTE — ED Triage Notes (Signed)
Patient c/o right hip pain x 2 weeks. Patient states that when he takes more than 5 steps or walking side ways he can hear and feel a pop in his right hip.

## 2017-07-05 NOTE — Discharge Instructions (Signed)
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 I would recommend taking muscle relaxers, anti-inflammatories, using a lidocaine patch.  Please the the flexeril for muscle relaxation. This medication will make you drowsy so avoid situation that could place you in danger.   Please take the Naproxen as prescribed for pain. Do not take any additional NSAIDs including Motrin, Aleve, Ibuprofen, Advil.  Have given a referral to the orthopedist to use. Return to the ED if he develops any worsening symptoms including fevers, loss of bowel or bladder, unable to urinate, numbness in her groin.

## 2017-07-05 NOTE — ED Provider Notes (Signed)
[facility]-EMERGENCY DEPT Provider Note   CSN: [id number] Arrival date & time: 07/05/17  0736     History   Chief Complaint Chief Complaint  Patient presents with  . Hip Pain    HPI Connor Andrews is a 46 y.o. male.  HPI 46 year old African American male with no stiffness Past medical history presents to the emergency Department today with complaints of right hip pain. Patient states that his pain has been ongoing for 1-2 weeks. The patient states that when he walks he can hear and feel a pop in his right hip. Patient states that his limited range of motion and ambulation due to the pain and feeling in his right hip. Patient states that since this started he started developing low back pain in the midline as well. Patient has not tried anything for the pain prior to arrival. Moving makes the pain worse. Holding still makes the pain better. No history of same. Pt denies any ha, night sweats, hx of ivdu/cancer, loss or bowel or bladder, urinary retention, saddle paresthesias, lower extremity paresthesias. Patient denies any associated urinary symptoms, change in bowel habits, weakness. No recent injury. Patient denies any testicular swelling, testicular pain.Denies any chronic steroid use.  History reviewed. No pertinent past medical history.  There are no active problems to display for this patient.   History reviewed. No pertinent surgical history.     Home Medications    Prior to Admission medications   Medication Sig Start Date End Date Taking? Authorizing Provider  ciprofloxacin (CIPRO) 500 MG tablet Take 1 tablet (500 mg total) by mouth every 12 (twelve) hours. Patient not taking: Reported on 02/11/2016 09/17/15   Linwood Dibbles, MD  HYDROcodone-acetaminophen (NORCO/VICODIN) 5-325 MG tablet Take 1 tablet by mouth every 4 (four) hours as needed. 02/11/16   Jacalyn Lefevre, MD  Menthol-Methyl Salicylate (MUSCLE RUB) 10-15 % CREA Apply 1 application topically as needed. Apply to affected areas  on back as needed    [provider]  promethazine (PHENERGAN) 25 MG tablet Take 1 tablet (25 mg total) by mouth every 6 (six) hours as needed for nausea or vomiting. 02/11/16   Jacalyn Lefevre, MD  traMADol (ULTRAM) 50 MG tablet Take 1 tablet (50 mg total) by mouth every 6 (six) hours as needed. Patient not taking: Reported on 02/11/2016 09/17/15   Linwood Dibbles, MD    Family History History reviewed. No pertinent family history.  Social History Social History  Substance Use Topics  . Smoking status: Current Every Day Smoker    Packs/day: 0.50    Types: Cigarettes  . Smokeless tobacco: Never Used  . Alcohol use Yes     Comment: weekends only     Allergies   Patient has no known allergies.   Review of Systems Review of Systems  Constitutional: Negative for chills and fever.  Genitourinary: Negative for decreased urine volume, frequency, hematuria, scrotal swelling, testicular pain and urgency.  Musculoskeletal: Positive for arthralgias, back pain and gait problem. Negative for joint swelling and myalgias.  Skin: Negative for color change.  Neurological: Negative for weakness and numbness.     Physical Exam Updated Vital Signs BP (!) 149/104 (BP Location: Left Arm)   Pulse 85   Temp 98.5 F (36.9 C) (Oral)   Resp 16   Ht  (1.753 m)   Wt 68 kg (150 lb)   SpO2 98%   BMI 22.15 kg/m   Physical Exam  Constitutional: He appears well-developed and well-nourished. No distress.  HENT:  Head: Normocephalic and atraumatic.  Eyes: Right eye exhibits no discharge. Left eye exhibits no discharge. No scleral icterus.  Neck: Normal range of motion.  Pulmonary/Chest: No respiratory distress.  Musculoskeletal:       Right hip: He exhibits decreased range of motion, tenderness, bony tenderness and swelling. He exhibits normal strength and no crepitus.       Lumbar back: He exhibits pain. He exhibits normal range of motion, no tenderness, no bony tenderness, no swelling, no  edema and no spasm.       Legs: With internal and external rotation of the right hip a popping sensation is felt. No pain with palpation of lateral SI joint. There is no obvious edema, ecchymosis, erythema noted. Strength is 5 out of 5 in lower extremities. Patellar reflexes are normal. DP pulses are 2+ bilaterally. Sensation intact. Cap refill normal.  No midline T spine or L spine tenderness. No deformities or step offs noted. Full ROM. Pelvis is stable.   Neurological: He is alert.  Skin: Skin is warm and dry. No pallor.  Psychiatric: His behavior is normal. Judgment and thought content normal.  Nursing note and vitals reviewed.    ED Treatments / Results  Labs (all labs ordered are listed, but only abnormal results are displayed) Labs Reviewed - No data to display  EKG  EKG Interpretation None       Radiology No results found.  Procedures Procedures (including critical care time)  Medications Ordered in ED Medications  ketorolac (TORADOL) 30 MG/ML injection 15 mg (not administered)     Initial Impression / Assessment and Plan / ED Course  I have reviewed the triage vital signs and the nursing notes.  Pertinent labs & imaging results that were available during my care of the patient were reviewed by me and considered in my medical decision making (see chart for details).     Patient resents to the emergency Department today with complaints of right hip pain and low back pain. No known trauma. Patient denies any red flag symptoms OB concerning for cauda equina. Patient able to ambulate. Neurovascularly intact in all extremities. Patient denies any IV drug use or chronic steroid use. Patient does have some pain internal and external rotation of the right hip. Patient is able to ambulate but with some pain.  X-rays were unremarkable for any acute fractures. 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  Pain somewhat improved with  Toradol injection. We'll give NSAIDs, muscle relaxers, lidocaine patch. We'll give her crutches for weightbearing as tolerated. Patient will need follow-up with orthopedist. Have given him strict return precautions.  Pt is hemodynamically stable, in NAD, & able to ambulate in the ED. Evaluation does not show pathology that would require ongoing emergent intervention or inpatient treatment. I explained the diagnosis to the patient. Pain has been managed & has no complaints prior to dc. Pt is comfortable with above plan and is stable for discharge at this time. All questions were answered prior to disposition. Strict return precautions for f/u to the ED were discussed. Encouraged follow up with PCP.   Final Clinical Impressions(s) / ED Diagnoses   Final diagnoses:  Right hip pain    New Prescriptions New Prescriptions   No medications on file     Wallace Keller 07/05/17 1354    Nira Conn, MD 07/05/17 1755

## 2019-05-31 ENCOUNTER — Ambulatory Visit: Payer: Self-pay | Admitting: Nurse Practitioner

## 2019-05-31 ENCOUNTER — Other Ambulatory Visit: Payer: Self-pay

## 2019-05-31 ENCOUNTER — Encounter: Payer: Self-pay | Admitting: Nurse Practitioner

## 2019-05-31 VITALS — BP 138/104 | HR 86 | Temp 98.6°F | Ht 67.4 in | Wt 137.6 lb

## 2019-05-31 DIAGNOSIS — R3121 Asymptomatic microscopic hematuria: Secondary | ICD-10-CM

## 2019-05-31 DIAGNOSIS — R202 Paresthesia of skin: Secondary | ICD-10-CM

## 2019-05-31 DIAGNOSIS — R630 Anorexia: Secondary | ICD-10-CM

## 2019-05-31 DIAGNOSIS — Z8249 Family history of ischemic heart disease and other diseases of the circulatory system: Secondary | ICD-10-CM

## 2019-05-31 DIAGNOSIS — M545 Low back pain, unspecified: Secondary | ICD-10-CM

## 2019-05-31 DIAGNOSIS — R350 Frequency of micturition: Secondary | ICD-10-CM

## 2019-05-31 DIAGNOSIS — R634 Abnormal weight loss: Secondary | ICD-10-CM

## 2019-05-31 DIAGNOSIS — I10 Essential (primary) hypertension: Secondary | ICD-10-CM

## 2019-05-31 DIAGNOSIS — Z833 Family history of diabetes mellitus: Secondary | ICD-10-CM

## 2019-05-31 LAB — POCT URINALYSIS DIPSTICK
Bilirubin, UA: NEGATIVE
Glucose, UA: NEGATIVE
Ketones, UA: NEGATIVE
Leukocytes, UA: NEGATIVE
Nitrite, UA: NEGATIVE
Protein, UA: NEGATIVE
Spec Grav, UA: 1.025 (ref 1.010–1.025)
Urobilinogen, UA: 0.2 E.U./dL
pH, UA: 6 (ref 5.0–8.0)

## 2019-05-31 NOTE — Progress Notes (Signed)
Subjective:     Patient ID: Connor Andrews , male    DOB: [birth year] , 48 y.o.   MRN: [id number]   Chief Complaint  Patient presents with  . Establish Care  . Numbness    patient stated he has been having some tingling in his arms and legs for the past 2 weeks. he noticed that he is unable to hold items in his hands for long.     HPI  Here to establish care - his mother referred him who is a patient here [facility].  He has not seen a PCP in the last 2 years.  Currently working. Married.  2 children.    No past medical history. Mother with HTN, diabetes, Father - HTN.    Decreased appetite.  Reports weight loss since 2 months - was 156 lbs.  Unable to drive due to leg tension and lose feeling in his arms.  Unable to hold objects.  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  Persistent tingling to bilateral arms and legs.  Constant shocking sensation to arms and legs.  He is ambidextrous.  Frequency in urination.  Denies polyuria and polydypsia.    Occasional alcohol on weekends.  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  Unfortunately he does not have any health insurance so he has not been seen by a PCP  Back Pain This is a chronic problem. The current episode started more than 1 month ago. The problem has been gradually worsening since onset. The quality of the pain is described as shooting. The pain does not radiate. The patient is experiencing no pain. The pain is the same all the time. The symptoms are aggravated by bending and standing. Stiffness is present all day. Pertinent negatives include no abdominal pain or headaches. (Reports he is unable to do any activity due to the pain and numbness/tingling to his hands affected when driving) Risk factors include sedentary lifestyle.     Past Medical History:  Diagnosis Date  . Arthritis      Family History  Problem Relation Age of Onset  . Hypertension Mother   . Diabetes Mother    . Hypertension Father     No current outpatient medications on file.   No Known Allergies   Review of Systems  Constitutional: Negative.   Respiratory: Negative.   Cardiovascular: Negative.   Gastrointestinal: Negative for abdominal pain.  Musculoskeletal: Positive for back pain.  Neurological: Negative for headaches.  Psychiatric/Behavioral: Negative.      Today's Vitals   05/31/19 1427  BP: (!) 136/100  Pulse: 86  Temp: 98.6 F (37 C)  TempSrc: Oral  Weight: 137 lb 9.6 oz (62.4 kg)  Height: 5' 7.4" (1.712 m)  PainSc: 9   PainLoc: Arm   Body mass index is 21.3 kg/m.   Objective:  Physical Exam Constitutional:      General: He is not in acute distress.    Comments: Thin appearing, rarely makes eye contact  Cardiovascular:     Rate and Rhythm: Normal rate and regular rhythm.     Pulses: Normal pulses.     Heart sounds: Normal heart sounds. No murmur.  Pulmonary:     Effort: Pulmonary effort is normal. No respiratory distress.     Breath sounds: Normal breath sounds.  Musculoskeletal: Normal range of motion.     Comments: Gait is slow  Skin:    General: Skin is warm  and dry.     Capillary Refill: Capillary refill takes less than 2 seconds.  Neurological:     General: No focal deficit present.     Mental Status: He is alert and oriented to person, place, and time.  Psychiatric:        Thought Content: Thought content normal.        Judgment: Judgment normal.     Comments: Flat affect         Assessment And Plan:    1. Tingling of skin  This has been ongoing for several months  Will check metabolic causes - TSH - Vitamin B12 - Sed Rate (ESR)  2. Increased frequency of urination  Will check for urinary tract infection or abnormal glucose  Encouraged to continue with increasing fluids - Hemoglobin A1c - CMP14 + Anion Gap - CBC with Diff - POCT Urinalysis Dipstick (81002)  3. Acute bilateral low back pain without sciatica  No problems  with urinary or bowel incontinence  Negative CVA tenderness - Sed Rate (ESR)  4. Decreased appetite  Pending labs will consider referral to GI for further evaluation since he is also having unexplained weight loss  5. Weight loss  Reports approximately 15-20 lb weight loss in the last 6 months  Will check metabolic causes  6. Elevated blood pressure reading in office with diagnosis of hypertension  He denies any problems with his blood pressure being elevated  I am not starting him on any medications at this time but will have him to return in 1 week for a recheck  Avoid high salt foods  EKG revealed NSR - EKG 12-Lead  7. Asymptomatic microscopic hematuria  Will send for urine culture - Culture, Urine      [name], FNP    THE PATIENT IS ENCOURAGED TO PRACTICE SOCIAL DISTANCING DUE TO THE COVID-19 PANDEMIC.

## 2019-05-31 NOTE — Patient Instructions (Signed)
Paresthesia Paresthesia is a burning or prickling feeling. This feeling can happen in any part of the body. It often happens in the hands, arms, legs, or feet. Usually, it is not painful. In most cases, the feeling goes away in a short time and is not a sign of a serious problem. If you have paresthesia that lasts a long time, you may need to be seen by your doctor. Follow these instructions at home: Alcohol use   Do not drink alcohol if: ? Your doctor tells you not to drink. ? You are pregnant, may be pregnant, or are planning to become pregnant.  If you drink alcohol: ? Limit how much you use to:  0-1 drink a day for women.  0-2 drinks a day for men. ? Be aware of how much alcohol is in your drink. In the [place] one drink equals one 12 oz bottle of beer (355 mL), one 5 oz glass of wine (148 mL), or one 1 oz glass of hard liquor (44 mL). Nutrition   Eat a healthy diet. This includes: ? Eating foods that have a lot of fiber in them, such as fresh fruits and vegetables, whole grains, and beans. ? Limiting foods that have a lot of fat and processed sugars in them, such as fried or sweet foods. General instructions  Take over-the-counter and prescription medicines only as told by your doctor.  Do not use any products that have nicotine or tobacco in them, such as cigarettes and e-cigarettes. If you need help quitting, ask your doctor.  If you have diabetes, work with your doctor to make sure your blood sugar stays in a healthy range.  If your feet feel numb: ? Check for redness, warmth, and swelling every day. ? Wear padded socks and comfortable shoes. These help protect your feet.  Keep all follow-up visits as told by your doctor. This is important. Contact a doctor if:  You have paresthesia that gets worse or does not go away.  Your burning or prickling feeling gets worse when you walk.  You have pain or cramps.  You feel dizzy.  You have a rash. Get help right away if  you:  Feel weak.  Have trouble walking or moving.  Have problems speaking, understanding, or seeing.  Feel confused.  Cannot control when you pee (urinate) or poop (have a bowel movement).  Lose feeling (have numbness) after an injury.  Have new weakness in an arm or leg.  Pass out (faint). Summary  Paresthesia is a burning or prickling feeling. It often happens in the hands, arms, legs, or feet.  In most cases, the feeling goes away in a short time and is not a sign of a serious problem.  If you have paresthesia that lasts a long time, you may need to be seen by your doctor. This information is not intended to replace advice given to you by your health care provider. Make sure you discuss any questions you have with your health care provider. Document Released: 09/10/2008 Document Revised: 10/24/2018 Document Reviewed: 10/07/2017 Elsevier Patient Education  2020 [employer]  

## 2019-06-01 LAB — CMP14 + ANION GAP
ALT: 17 IU/L (ref 0–44)
AST: 17 IU/L (ref 0–40)
Albumin/Globulin Ratio: 2.1 (ref 1.2–2.2)
Albumin: 4.9 g/dL (ref 4.0–5.0)
Alkaline Phosphatase: 57 IU/L (ref 39–117)
Anion Gap: 13 mmol/L (ref 10.0–18.0)
BUN/Creatinine Ratio: 10 (ref 9–20)
BUN: 9 mg/dL (ref 6–24)
Bilirubin Total: 0.7 mg/dL (ref 0.0–1.2)
CO2: 24 mmol/L (ref 20–29)
Calcium: 9.6 mg/dL (ref 8.7–10.2)
Chloride: 100 mmol/L (ref 96–106)
Creatinine, Ser: 0.88 mg/dL (ref 0.76–1.27)
GFR calc Af Amer: 117 mL/min/{1.73_m2} (ref >59)
GFR calc non Af Amer: 101 mL/min/{1.73_m2} (ref >59)
Globulin, Total: 2.3 g/dL (ref 1.5–4.5)
Glucose: 82 mg/dL (ref 65–99)
Potassium: 4.1 mmol/L (ref 3.5–5.2)
Sodium: 137 mmol/L (ref 134–144)
Total Protein: 7.2 g/dL (ref 6.0–8.5)

## 2019-06-01 LAB — CBC WITH DIFFERENTIAL/PLATELET
Basophils Absolute: 0.1 10*3/uL (ref 0.0–0.2)
Basos: 1 %
EOS (ABSOLUTE): 0.1 10*3/uL (ref 0.0–0.4)
Eos: 1 %
Hematocrit: 41.7 % (ref 37.5–51.0)
Hemoglobin: 14.1 g/dL (ref 13.0–17.7)
Immature Grans (Abs): 0 10*3/uL (ref 0.0–0.1)
Immature Granulocytes: 0 %
Lymphocytes Absolute: 1.8 10*3/uL (ref 0.7–3.1)
Lymphs: 25 %
MCH: 32.2 pg (ref 26.6–33.0)
MCHC: 33.8 g/dL (ref 31.5–35.7)
MCV: 95 fL (ref 79–97)
Monocytes Absolute: 0.4 10*3/uL (ref 0.1–0.9)
Monocytes: 6 %
Neutrophils Absolute: 4.6 10*3/uL (ref 1.4–7.0)
Neutrophils: 67 %
Platelets: 301 10*3/uL (ref 150–450)
RBC: 4.38 x10E6/uL (ref 4.14–5.80)
RDW: 12 % (ref 11.6–15.4)
WBC: 6.9 10*3/uL (ref 3.4–10.8)

## 2019-06-01 LAB — TSH: TSH: 0.538 u[IU]/mL (ref 0.450–4.500)

## 2019-06-01 LAB — HEMOGLOBIN A1C
Est. average glucose Bld gHb Est-mCnc: 85 mg/dL
Hgb A1c MFr Bld: 4.6 % — ABNORMAL LOW (ref 4.8–5.6)

## 2019-06-01 LAB — SEDIMENTATION RATE: Sed Rate: 5 mm/hr (ref 0–15)

## 2019-06-01 LAB — VITAMIN B12: Vitamin B-12: 566 pg/mL (ref 232–1245)

## 2019-06-06 NOTE — Progress Notes (Signed)
We can order a nerve conduction test to see if he has neuropathy for the numbness to his hands is he interested. Or we can refer him to neurology for further evaluation

## 2019-06-08 ENCOUNTER — Ambulatory Visit: Payer: Self-pay | Admitting: Nurse Practitioner

## 2019-06-08 ENCOUNTER — Encounter: Payer: Self-pay | Admitting: Nurse Practitioner

## 2019-06-08 ENCOUNTER — Other Ambulatory Visit: Payer: Self-pay

## 2019-06-08 VITALS — BP 132/80 | HR 106 | Temp 98.2°F | Ht 67.8 in | Wt 135.2 lb

## 2019-06-08 DIAGNOSIS — R42 Dizziness and giddiness: Secondary | ICD-10-CM

## 2019-06-08 DIAGNOSIS — R634 Abnormal weight loss: Secondary | ICD-10-CM

## 2019-06-08 DIAGNOSIS — I10 Essential (primary) hypertension: Secondary | ICD-10-CM

## 2019-06-08 DIAGNOSIS — R52 Pain, unspecified: Secondary | ICD-10-CM

## 2019-06-08 DIAGNOSIS — R630 Anorexia: Secondary | ICD-10-CM

## 2019-06-08 DIAGNOSIS — M542 Cervicalgia: Secondary | ICD-10-CM

## 2019-06-08 DIAGNOSIS — R200 Anesthesia of skin: Secondary | ICD-10-CM

## 2019-06-08 DIAGNOSIS — R202 Paresthesia of skin: Secondary | ICD-10-CM

## 2019-06-08 MED ORDER — MAGNESIUM 250 MG PO TABS: 1.0000 | ORAL_TABLET | Freq: Every day | ORAL | 1 refills | Status: DC

## 2019-06-08 MED ORDER — MIRTAZAPINE 15 MG PO TABS: 15.0000 mg | ORAL_TABLET | Freq: Every day | ORAL | 2 refills | Status: DC

## 2019-06-08 MED ORDER — PREDNISONE 10 MG (21) PO TBPK: ORAL_TABLET | ORAL | 0 refills | Status: DC

## 2019-06-08 MED ORDER — GABAPENTIN 100 MG PO CAPS: 100.0000 mg | ORAL_CAPSULE | Freq: Three times a day (TID) | ORAL | 2 refills | Status: DC

## 2019-06-08 MED ORDER — KETOROLAC TROMETHAMINE 60 MG/2ML IM SOLN
60.0000 mg | Freq: Once | INTRAMUSCULAR | Status: AC
  Administered 2019-06-08: 60 mg via INTRAMUSCULAR

## 2019-06-08 NOTE — Progress Notes (Addendum)
Subjective:     Patient ID: Connor Andrews , male    DOB: 1971-05-25 , 48 y.o.   MRN: [id number]   Chief Complaint  Patient presents with  . Hypertension    patient presents today for a one week blood pressure check    HPI  He is unable to look down.  Unable to drive his personal vehicle has dizziness all the time. He has been drinking orange juice and water.  He is an every now and then smoker.  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  Will also have stubbling while walking in the grocery store.  Has had several falls as well.    Will have hot and cold episodes.    2018 was 167 lbs.    Constant pain no matter  When he rubs his neck he will have some relief of his neck will have cold chills down hands.  No family history of MS.  Hypertension This is a chronic problem. The problem is unchanged. The problem is controlled. Pertinent negatives include no anxiety, chest pain, headaches or palpitations. There are no associated agents to hypertension. There are no known risk factors for coronary artery disease.     Past Medical History:  Diagnosis Date  . Arthritis      Family History  Problem Relation Age of Onset  . Hypertension Mother   . Diabetes Mother   . Hypertension Father      No current outpatient medications on file.   No Known Allergies   Review of Systems  Constitutional: Negative.   Respiratory: Negative.   Cardiovascular: Negative.  Negative for chest pain, palpitations and leg swelling.  Neurological: Negative for dizziness and headaches.  Psychiatric/Behavioral: Negative.      Today's Vitals   06/08/19 1607  BP: 132/80  Pulse: (!) 106  Temp: 98.2 F (36.8 C)  TempSrc: Oral  Weight: 135 lb 3.2 oz (61.3 kg)  Height: 5' 7.8" (1.722 m)  PainSc: 10-Worst pain ever   Body mass index is 20.68 kg/m.   Objective:  Physical Exam Constitutional:      General: He is not in acute distress.    Appearance: Normal appearance.   Comments: Thin appearing, rarely makes eye contact  Cardiovascular:     Rate and Rhythm: Normal rate and regular rhythm.     Pulses: Normal pulses.     Heart sounds: Normal heart sounds. No murmur.  Pulmonary:     Effort: Pulmonary effort is normal. No respiratory distress.     Breath sounds: Normal breath sounds.  Musculoskeletal: Normal range of motion.  Skin:    General: Skin is warm and dry.     Capillary Refill: Capillary refill takes less than 2 seconds.  Neurological:     General: No focal deficit present.     Mental Status: He is alert and oriented to person, place, and time.  Psychiatric:        Thought Content: Thought content normal.        Judgment: Judgment normal.     Comments: Flat affect, he is a little more interactive this visit         Assessment And Plan:     1.  Unexplained weight loss Metabolic test were normal however I am adding an ANA Will consider referral to GI if no abnormal findings Will start him on mirtazapine to see if this helps him with his appetite and help with sleep - DG Chest 2 View; Future -  Ambulatory referral to Neurology - ANA, IFA (with reflex)  2. Dizziness  Checking an ANA and referring to neurology for further evaluation  No metabolic cause to dizziness  He is unable to drive due to this - Ambulatory referral to Neurology - ANA, IFA (with reflex)  3. Sharp pain  Will try gabapentin up to 3 times a day  Ordered MRI without contrast of cervical spine, he is willing to pay - gabapentin (NEURONTIN) 100 MG capsule; Take 1 capsule (100 mg total) by mouth 3 (three) times daily.  Dispense: 90 capsule; Refill: 2 - ANA, IFA (with reflex)  4. Generalized pain  administered toradol in office - ketorolac (TORADOL) injection 60 mg  5. Decreased appetite  He continues to have decreased appetite will start him on mirtazapine while we are trying to see the cause - mirtazapine (REMERON) 15 MG tablet; Take 1 tablet (15 mg total) by  mouth at bedtime.  Dispense: 30 tablet; Refill: 2  6. Numbness and tingling in both hands  Will check neck MRI without contrast to see if there is any nerve damage  7. Cervicalgia  Persistent neck pain  Will check MRI  8. Elevated blood pressure reading in office with diagnosis of hypertension  Improved since last visit  If at his next visit continues to be elevated will likely need to start a medication, this elevation can be related to an increase in pain   [name], FNP    THE PATIENT IS ENCOURAGED TO PRACTICE SOCIAL DISTANCING DUE TO THE COVID-19 PANDEMIC.

## 2019-06-11 ENCOUNTER — Encounter: Payer: Self-pay | Admitting: Nurse Practitioner

## 2019-07-07 ENCOUNTER — Other Ambulatory Visit: Payer: Self-pay

## 2019-07-07 DIAGNOSIS — Z20822 Contact with and (suspected) exposure to covid-19: Secondary | ICD-10-CM

## 2019-07-08 LAB — NOVEL CORONAVIRUS, NAA: SARS-CoV-2, NAA: NOT DETECTED

## 2019-07-11 ENCOUNTER — Ambulatory Visit: Payer: Self-pay | Admitting: Nurse Practitioner

## 2020-01-05 ENCOUNTER — Emergency Department (HOSPITAL_BASED_OUTPATIENT_CLINIC_OR_DEPARTMENT_OTHER)
Admission: EM | Admit: 2020-01-05 | Discharge: 2020-01-05 | Disposition: A | Discharge status: Home / Self Care | Payer: Self-pay | Attending: Emergency Medicine | Admitting: Emergency Medicine

## 2020-01-05 ENCOUNTER — Emergency Department (HOSPITAL_BASED_OUTPATIENT_CLINIC_OR_DEPARTMENT_OTHER): Payer: Self-pay

## 2020-01-05 ENCOUNTER — Encounter (HOSPITAL_BASED_OUTPATIENT_CLINIC_OR_DEPARTMENT_OTHER): Payer: Self-pay | Admitting: Emergency Medicine

## 2020-01-05 ENCOUNTER — Other Ambulatory Visit: Payer: Self-pay

## 2020-01-05 DIAGNOSIS — Z79899 Other long term (current) drug therapy: Secondary | ICD-10-CM | POA: Insufficient documentation

## 2020-01-05 DIAGNOSIS — F17210 Nicotine dependence, cigarettes, uncomplicated: Secondary | ICD-10-CM | POA: Insufficient documentation

## 2020-01-05 DIAGNOSIS — M5431 Sciatica, right side: Secondary | ICD-10-CM | POA: Insufficient documentation

## 2020-01-05 LAB — URINALYSIS, ROUTINE W REFLEX MICROSCOPIC
Bilirubin Urine: NEGATIVE
Glucose, UA: NEGATIVE mg/dL
Ketones, ur: NEGATIVE mg/dL
Leukocytes,Ua: NEGATIVE
Nitrite: NEGATIVE
Protein, ur: NEGATIVE mg/dL
Specific Gravity, Urine: 1.02 (ref 1.005–1.030)
pH: 7 (ref 5.0–8.0)

## 2020-01-05 LAB — URINALYSIS, MICROSCOPIC (REFLEX): Bacteria, UA: NONE SEEN

## 2020-01-05 MED ORDER — KETOROLAC TROMETHAMINE 60 MG/2ML IM SOLN
60.0000 mg | Freq: Once | INTRAMUSCULAR | Status: AC
  Administered 2020-01-05: 60 mg via INTRAMUSCULAR
  Filled 2020-01-05: qty 2

## 2020-01-05 MED ORDER — PREDNISONE 50 MG PO TABS
60.0000 mg | ORAL_TABLET | Freq: Once | ORAL | Status: AC
  Administered 2020-01-05: 60 mg via ORAL
  Filled 2020-01-05: qty 1

## 2020-01-05 MED ORDER — ACETAMINOPHEN 500 MG PO TABS
1000.0000 mg | ORAL_TABLET | Freq: Once | ORAL | Status: AC
  Administered 2020-01-05: 1000 mg via ORAL
  Filled 2020-01-05: qty 2

## 2020-01-05 MED ORDER — LIDOCAINE 5 % EX PTCH
3.0000 | MEDICATED_PATCH | CUTANEOUS | Status: DC
  Administered 2020-01-05: 2 via TRANSDERMAL
  Filled 2020-01-05: qty 3

## 2020-01-05 MED ORDER — HALOPERIDOL LACTATE 5 MG/ML IJ SOLN
5.0000 mg | Freq: Once | INTRAMUSCULAR | Status: AC
  Administered 2020-01-05: 5 mg via INTRAMUSCULAR
  Filled 2020-01-05: qty 1

## 2020-01-05 NOTE — ED Triage Notes (Signed)
Right sided lower back pain, pt reports back has been bothering him all day. Hx of pain in same location. Ibuprofen one hour ago with no relief. States he cannot walk. States no urinary symptoms, no bowel or bladder incontinence.

## 2020-01-05 NOTE — ED Provider Notes (Signed)
[facility] EMERGENCY DEPARTMENT Provider Note   CSN: [id number] Arrival date & time: 01/05/20  0211     History Chief Complaint  Patient presents with  . Back Pain    Connor Andrews is a 49 y.o. male.  The history is provided by the patient.  Back Pain Location:  Gluteal region Quality:  Shooting Radiates to: RLE. Pain severity:  Severe Pain is:  Same all the time Onset quality:  Gradual Timing:  Constant Progression:  Unchanged Chronicity:  Chronic Context: not emotional stress, not falling, not jumping from heights, not lifting heavy objects, not MCA, not MVA, not occupational injury and not pedestrian accident   Relieved by:  Nothing Worsened by:  Nothing Ineffective treatments:  None tried Associated symptoms: no abdominal pain, no chest pain, no dysuria, no fever, no numbness and no weakness   Risk factors: no hx of cancer   Has had this problem for over 8 months and it got worse when he got up to go to the bathroom.  He has take one ibuprofen without relied.  No weakness no numbness no changes in bowel or bladdery habits.  Was supposed to follow up post doctor visit in August and has not.       Past Medical History:  Diagnosis Date  . Arthritis     There are no problems to display for this patient.   History reviewed. No pertinent surgical history.     Family History  Problem Relation Age of Onset  . Hypertension Mother   . Diabetes Mother   . Hypertension Father     Social History   Tobacco Use  . Smoking status: Current Every Day Smoker    Packs/day: 1.00    Types: Cigarettes  . Smokeless tobacco: Never Used  Substance Use Topics  . Alcohol use: Yes    Comment: weekends only  . Drug use: No    Home Medications Prior to Admission medications   Medication Sig Start Date End Date Taking? Authorizing Provider  gabapentin (NEURONTIN) 100 MG capsule Take 1 capsule (100 mg total) by mouth 3 (three) times daily. 06/08/19 06/07/20  Arnette Felts, FNP  mirtazapine (REMERON) 15 MG tablet Take 1 tablet (15 mg total) by mouth at bedtime. 06/08/19 06/07/20  Arnette Felts, FNP    Allergies    Patient has no known allergies.  Review of Systems   Review of Systems  Constitutional: Negative for fever.  HENT: Negative for congestion.   Eyes: Negative for visual disturbance.  Respiratory: Negative for shortness of breath.   Cardiovascular: Negative for chest pain.  Gastrointestinal: Negative for abdominal pain, nausea and vomiting.  Genitourinary: Negative for difficulty urinating, dysuria and flank pain.  Musculoskeletal: Positive for back pain. Negative for joint swelling.  Skin: Negative for rash and wound.  Neurological: Negative for weakness and numbness.  Psychiatric/Behavioral: Negative for agitation.  All other systems reviewed and are negative.   Physical Exam Updated Vital Signs BP (!) 142/94 (BP Location: Right Arm)   Pulse 70   Temp 97.7 F (36.5 C) (Oral)   Resp 20   Ht 5\' 9"  (1.753 m)   Wt 68 kg   SpO2 100%   BMI 22.15 kg/m   Physical Exam Vitals and nursing note reviewed.  Constitutional:      General: He is not in acute distress.    Appearance: Normal appearance. He is normal weight.  HENT:     Head: Normocephalic and atraumatic.     Nose:  Nose normal.  Eyes:     Conjunctiva/sclera: Conjunctivae normal.     Pupils: Pupils are equal, round, and reactive to light.  Cardiovascular:     Rate and Rhythm: Normal rate and regular rhythm.     Pulses: Normal pulses.     Heart sounds: Normal heart sounds.  Pulmonary:     Effort: Pulmonary effort is normal.     Breath sounds: Normal breath sounds.  Abdominal:     General: Abdomen is flat. Bowel sounds are normal.     Tenderness: There is no abdominal tenderness. There is no guarding or rebound.  Musculoskeletal:        General: Normal range of motion.     Cervical back: Normal, normal range of motion and neck supple.     Thoracic back: Normal.      Lumbar back: Normal.     Right hip: Normal.     Right upper leg: Normal.     Right knee: Normal.     Right lower leg: No edema.     Left lower leg: No edema.     Right ankle: Normal.     Right Achilles Tendon: Normal.     Left ankle: Normal.     Left Achilles Tendon: Normal.     Right foot: Normal. No foot drop.     Left foot: No foot drop.  Skin:    General: Skin is warm and dry.     Capillary Refill: Capillary refill takes less than 2 seconds.     Findings: No rash.  Neurological:     General: No focal deficit present.     Mental Status: He is alert and oriented to person, place, and time.     Sensory: No sensory deficit.     Motor: No weakness.     Coordination: Coordination normal.     Deep Tendon Reflexes: Reflexes normal.     Comments: No saddle anesthesia   Psychiatric:        Mood and Affect: Mood normal.        Behavior: Behavior normal.     ED Results / Procedures / Treatments   Labs (all labs ordered are listed, but only abnormal results are displayed) Labs Reviewed  URINALYSIS, ROUTINE W REFLEX MICROSCOPIC    EKG None  Radiology DG Lumbar Spine Complete  Result Date: 01/05/2020 CLINICAL DATA:  Initial evaluation for acute severe low back pain with right leg pain. No trauma. EXAM: LUMBAR SPINE - COMPLETE 4+ VIEW COMPARISON:  None. FINDINGS: There is no evidence of lumbar spine fracture. Trace levoscoliosis. Alignment otherwise normal with preservation of the normal lumbar lordosis. Mild degenerative spondylosis noted at L3-4 and L4-5. Visualized soft tissues within normal limits. IMPRESSION: 1. No acute abnormality within the lumbar spine. 2. Mild degenerative spondylosis at L3-4 and L4-5. Electronically Signed   By: Rise Mu M.D.   On: 01/05/2020 04:27    Procedures Procedures (including critical care time)  Medications Ordered in ED Medications  lidocaine (LIDODERM) 5 % 3 patch (2 patches Transdermal Patch Applied 01/05/20 0238)  ketorolac  (TORADOL) injection 60 mg (60 mg Intramuscular Given 01/05/20 0236)  predniSONE (DELTASONE) tablet 60 mg (60 mg Oral Given 01/05/20 0235)  acetaminophen (TYLENOL) tablet 1,000 mg (1,000 mg Oral Given 01/05/20 0431)  haloperidol lactate (HALDOL) injection 5 mg (5 mg Intramuscular Given 01/05/20 0432)    ED Course  I have reviewed the triage vital signs and the nursing notes.  Pertinent labs & imaging results  that were available during my care of the patient were reviewed by me and considered in my medical decision making (see chart for details).   Symptoms are consistent with sciatica.  Patient is improved post medication.  No signs of acute pathology on Xray.  Voided on own and urine is without infection.  Will prescribe naproxen, robaxin, steroids and lidoderm and have patient follow up with his PMD.     Sleeping in the room upon entrance.  Able to ambulate.  No weakness no numbness symptoms consistent with sciatica. Will treat for same and have patient follow up with PMD for ongoing care.No signs of infection nor cord compression    Paul Trettin was evaluated in Emergency Department on 01/05/2020 for the symptoms described in the history of present illness. He was evaluated in the context of the global COVID-19 pandemic, which necessitated consideration that the patient might be at risk for infection with the SARS-CoV-2 virus that causes COVID-19. Institutional protocols and algorithms that pertain to the evaluation of patients at risk for COVID-19 are in a state of rapid change based on information released by regulatory bodies including the CDC and federal and state organizations. These policies and algorithms were followed during the patient's care in the ED.   Final Clinical Impression(s) / ED Diagnoses Final diagnoses:  Sciatica of right side    Return for weakness, numbness, changes in vision or speech, fevers >100.4 unrelieved by medication, shortness of breath, intractable vomiting, or  diarrhea, abdominal pain, Inability to tolerate liquids or food, cough, altered mental status or any concerns. No signs of systemic illness or infection. The patient is nontoxic-appearing on exam and vital signs are within normal limits.   I have reviewed the triage vital signs and the nursing notes. Pertinent labs &imaging results that were available during my care of the patient were reviewed by me and considered in my medical decision making (see chart for details).  After history, exam, and medical workup I feel the patient has been appropriately medically screened and is safe for discharge home. Pertinent diagnoses were discussed with the patient. Patient was givenstrictreturn precautions.   Yalexa Blust, MD 01/05/20 3710

## 2020-01-05 NOTE — ED Notes (Signed)
  Patient stood up on side of the bed to use urinal.  Patient was able to tolerate standing unassisted while obtaining specimen.

## 2020-01-15 ENCOUNTER — Other Ambulatory Visit: Payer: Self-pay

## 2020-01-15 ENCOUNTER — Ambulatory Visit: Payer: Self-pay | Admitting: Nurse Practitioner

## 2020-01-15 ENCOUNTER — Encounter: Payer: Self-pay | Admitting: Nurse Practitioner

## 2020-01-15 VITALS — BP 132/84 | HR 90 | Temp 98.7°F | Ht 69.0 in | Wt 136.8 lb

## 2020-01-15 DIAGNOSIS — M5441 Lumbago with sciatica, right side: Secondary | ICD-10-CM

## 2020-01-15 DIAGNOSIS — R634 Abnormal weight loss: Secondary | ICD-10-CM

## 2020-01-15 DIAGNOSIS — M25511 Pain in right shoulder: Secondary | ICD-10-CM

## 2020-01-15 DIAGNOSIS — R52 Pain, unspecified: Secondary | ICD-10-CM

## 2020-01-15 MED ORDER — MELOXICAM 7.5 MG PO TABS: 7.5000 mg | ORAL_TABLET | Freq: Every day | ORAL | 2 refills | Status: DC

## 2020-01-15 MED ORDER — GABAPENTIN 300 MG PO CAPS: 300.0000 mg | ORAL_CAPSULE | Freq: Three times a day (TID) | ORAL | 2 refills | Status: DC

## 2020-01-15 MED ORDER — TRIAMCINOLONE ACETONIDE 40 MG/ML IJ SUSP
40.0000 mg | Freq: Once | INTRAMUSCULAR | Status: AC
  Administered 2020-01-15: 40 mg via INTRAMUSCULAR

## 2020-01-15 NOTE — Progress Notes (Signed)
This visit occurred during the SARS-CoV-2 public health emergency.  Safety protocols were in place, including screening questions prior to the visit, additional usage of staff PPE, and extensive cleaning of exam room while observing appropriate contact time as indicated for disinfecting solutions.  Subjective:     Patient ID: Connor Andrews , male    DOB: 1970/11/04 , 49 y.o.   MRN: [id number]   Chief Complaint  Patient presents with  . Back Pain    sciatica (R) side  . Weight Loss    HPI  Sharp pain on shoulder down to right side on right shoulder down right arm, feels like someone is stabbing him every 7 minutes. He had toradol 60 mg IM and prednisone 60 mg IM, which was not effective. Eased up a little bit went to sleep but when woke up was worse.     Smoke 1 pack every 3-4 days.   Back Pain This is a new problem. The current episode started more than 1 month ago (worsened last week unable to walk ). The problem occurs rarely. Pertinent negatives include no chest pain or headaches.     Past Medical History:  Diagnosis Date  . Arthritis      Family History  Problem Relation Age of Onset  . Hypertension Mother   . Diabetes Mother   . Hypertension Father      Current Outpatient Medications:  .  acetaminophen (TYLENOL) 500 MG tablet, Take 500 mg by mouth every 6 (six) hours as needed., Disp: , Rfl:  .  gabapentin (NEURONTIN) 100 MG capsule, Take 1 capsule (100 mg total) by mouth 3 (three) times daily., Disp: 90 capsule, Rfl: 2 .  mirtazapine (REMERON) 15 MG tablet, Take 1 tablet (15 mg total) by mouth at bedtime., Disp: 30 tablet, Rfl: 2   No Known Allergies   Review of Systems  Constitutional: Negative.   Respiratory: Negative.   Cardiovascular: Negative.  Negative for chest pain, palpitations and leg swelling.  Musculoskeletal: Positive for back pain.  Neurological: Negative for dizziness and headaches.     Today's Vitals   01/15/20 1443  BP: 132/84  Pulse: 90   Temp: 98.7 F (37.1 C)  TempSrc: Oral  SpO2: 94%  Weight: 136 lb 12.8 oz (62.1 kg)  Height: 5\' 9"  (1.753 m)   Body mass index is 20.2 kg/m.   Objective:  Physical Exam Constitutional:      Appearance: Normal appearance.  Cardiovascular:     Rate and Rhythm: Normal rate and regular rhythm.     Pulses: Normal pulses.     Heart sounds: Normal heart sounds. No murmur.  Pulmonary:     Effort: Pulmonary effort is normal. No respiratory distress.     Breath sounds: Normal breath sounds.  Neurological:     General: No focal deficit present.     Mental Status: He is alert and oriented to person, place, and time.     Cranial Nerves: No cranial nerve deficit.  Psychiatric:        Mood and Affect: Mood normal.        Behavior: Behavior normal.        Thought Content: Thought content normal.        Judgment: Judgment normal.         Assessment And Plan:     1. Sharp pain  Will provide gabapentin for the sharp pain and he can take up to 3 times a day - gabapentin (NEURONTIN) 300 MG capsule; Take  1 capsule (300 mg total) by mouth 3 (three) times daily.  Dispense: 90 capsule; Refill: 2 - meloxicam (MOBIC) 7.5 MG tablet; Take 1 tablet (7.5 mg total) by mouth daily.  Dispense: 30 tablet; Refill: 2  2. Acute pain of right shoulder  Will treat with meloxicam  Range of motion is good but limited - meloxicam (MOBIC) 7.5 MG tablet; Take 1 tablet (7.5 mg total) by mouth daily.  Dispense: 30 tablet; Refill: 2  3. Acute right-sided low back pain with right-sided sciatica  Will treat with steroid injection as this could be nerve related   I have advised him to make sure to go for the MRI that had been ordered a couple months ago - triamcinolone acetonide (KENALOG-40) injection 40 mg  4. Weight loss  He has continued weight loss, I will refer to GI for further evaluation - Ambulatory referral to Gastroenterology   [name], FNP    THE PATIENT IS ENCOURAGED TO PRACTICE  SOCIAL DISTANCING DUE TO THE COVID-19 PANDEMIC.

## 2020-01-15 NOTE — Patient Instructions (Signed)
Sciatica Rehab Ask your health care provider which exercises are safe for you. Do exercises exactly as told by your health care provider and adjust them as directed. It is normal to feel mild stretching, pulling, tightness, or discomfort as you do these exercises. Stop right away if you feel sudden pain or your pain gets worse. Do not begin these exercises until told by your health care provider. Stretching and range-of-motion exercises These exercises warm up your muscles and joints and improve the movement and flexibility of your hips and back. These exercises also help to relieve pain, numbness, and tingling. Sciatic nerve glide 1. Sit in a chair with your head facing down toward your chest. Place your hands behind your back. Let your shoulders slump forward. 2. Slowly straighten one of your legs while you tilt your head back as if you are looking toward the ceiling. Only straighten your leg as far as you can without making your symptoms worse. 3. Hold this position for __________ seconds. 4. Slowly return to the starting position. 5. Repeat with your other leg. Repeat __________ times. Complete this exercise __________ times a day. Knee to chest with hip adduction and internal rotation  1. Lie on your back on a firm surface with both legs straight. 2. Bend one of your knees and move it up toward your chest until you feel a gentle stretch in your lower back and buttock. Then, move your knee toward the shoulder that is on the opposite side from your leg. This is hip adduction and internal rotation. ? Hold your leg in this position by holding on to the front of your knee. 3. Hold this position for __________ seconds. 4. Slowly return to the starting position. 5. Repeat with your other leg. Repeat __________ times. Complete this exercise __________ times a day. Prone extension on elbows  1. Lie on your abdomen on a firm surface. A bed may be too soft for this exercise. 2. Prop yourself up on  your elbows. 3. Use your arms to help lift your chest up until you feel a gentle stretch in your abdomen and your lower back. ? This will place some of your body weight on your elbows. If this is uncomfortable, try stacking pillows under your chest. ? Your hips should stay down, against the surface that you are lying on. Keep your hip and back muscles relaxed. 4. Hold this position for __________ seconds. 5. Slowly relax your upper body and return to the starting position. Repeat __________ times. Complete this exercise __________ times a day. Strengthening exercises These exercises build strength and endurance in your back. Endurance is the ability to use your muscles for a long time, even after they get tired. Pelvic tilt This exercise strengthens the muscles that lie deep in the abdomen. 1. Lie on your back on a firm surface. Bend your knees and keep your feet flat on the floor. 2. Tense your abdominal muscles. Tip your pelvis up toward the ceiling and flatten your lower back into the floor. ? To help with this exercise, you may place a small towel under your lower back and try to push your back into the towel. 3. Hold this position for __________ seconds. 4. Let your muscles relax completely before you repeat this exercise. Repeat __________ times. Complete this exercise __________ times a day. Alternating arm and leg raises  1. Get on your hands and knees on a firm surface. If you are on a hard floor, you may want to use   padding, such as an exercise mat, to cushion your knees. 2. Line up your arms and legs. Your hands should be directly below your shoulders, and your knees should be directly below your hips. 3. Lift your left leg behind you. At the same time, raise your right arm and straighten it in front of you. ? Do not lift your leg higher than your hip. ? Do not lift your arm higher than your shoulder. ? Keep your abdominal and back muscles tight. ? Keep your hips facing the  ground. ? Do not arch your back. ? Keep your balance carefully, and do not hold your breath. 4. Hold this position for __________ seconds. 5. Slowly return to the starting position. 6. Repeat with your right leg and your left arm. Repeat __________ times. Complete this exercise __________ times a day. Posture and body mechanics Good posture and healthy body mechanics can help to relieve stress in your body's tissues and joints. Body mechanics refers to the movements and positions of your body while you do your daily activities. Posture is part of body mechanics. Good posture means:  Your spine is in its natural S-curve position (neutral).  Your shoulders are pulled back slightly.  Your head is not tipped forward. Follow these guidelines to improve your posture and body mechanics in your everyday activities. Standing   When standing, keep your spine neutral and your feet about hip width apart. Keep a slight bend in your knees. Your ears, shoulders, and hips should line up.  When you do a task in which you stand in one place for a long time, place one foot up on a stable object that is 2-4 inches (5-10 cm) high, such as a footstool. This helps keep your spine neutral. Sitting   When sitting, keep your spine neutral and keep your feet flat on the floor. Use a footrest, if necessary, and keep your thighs parallel to the floor. Avoid rounding your shoulders, and avoid tilting your head forward.  When working at a desk or a computer, keep your desk at a height where your hands are slightly lower than your elbows. Slide your chair under your desk so you are close enough to maintain good posture.  When working at a computer, place your monitor at a height where you are looking straight ahead and you do not have to tilt your head forward or downward to look at the screen. Resting  When lying down and resting, avoid positions that are most painful for you.  If you have pain with activities  such as sitting, bending, stooping, or squatting, lie in a position in which your body does not bend very much. For example, avoid curling up on your side with your arms and knees near your chest (fetal position).  If you have pain with activities such as standing for a long time or reaching with your arms, lie with your spine in a neutral position and bend your knees slightly. Try the following positions: ? Lying on your side with a pillow between your knees. ? Lying on your back with a pillow under your knees. Lifting   When lifting objects, keep your feet at least shoulder width apart and tighten your abdominal muscles.  Bend your knees and hips and keep your spine neutral. It is important to lift using the strength of your legs, not your back. Do not lock your knees straight out.  Always ask for help to lift heavy or awkward objects. This information is not   intended to replace advice given to you by your health care provider. Make sure you discuss any questions you have with your health care provider. Document Revised: 01/20/2019 Document Reviewed: 10/20/2018 Elsevier Patient Education  2020 Elsevier Inc.  

## 2020-01-29 ENCOUNTER — Encounter: Payer: Self-pay | Admitting: Gastroenterology

## 2020-02-27 ENCOUNTER — Ambulatory Visit: Payer: Self-pay | Admitting: Nurse Practitioner

## 2020-02-29 ENCOUNTER — Ambulatory Visit: Payer: Self-pay | Admitting: Gastroenterology

## 2020-04-23 NOTE — Progress Notes (Signed)
This encounter was created in error - please disregard.

## 2020-04-29 ENCOUNTER — Ambulatory Visit: Payer: Self-pay | Admitting: Nurse Practitioner

## 2020-05-16 ENCOUNTER — Encounter: Payer: Self-pay | Admitting: Nurse Practitioner

## 2020-05-16 ENCOUNTER — Ambulatory Visit: Payer: Self-pay | Admitting: Nurse Practitioner

## 2020-05-16 ENCOUNTER — Other Ambulatory Visit: Payer: Self-pay

## 2020-05-16 VITALS — BP 132/80 | HR 89 | Temp 98.4°F | Ht 69.0 in | Wt 134.0 lb

## 2020-05-16 DIAGNOSIS — R200 Anesthesia of skin: Secondary | ICD-10-CM

## 2020-05-16 DIAGNOSIS — R202 Paresthesia of skin: Secondary | ICD-10-CM

## 2020-05-16 DIAGNOSIS — Z9989 Dependence on other enabling machines and devices: Secondary | ICD-10-CM

## 2020-05-16 DIAGNOSIS — R55 Syncope and collapse: Secondary | ICD-10-CM

## 2020-05-16 DIAGNOSIS — M25541 Pain in joints of right hand: Secondary | ICD-10-CM

## 2020-05-16 DIAGNOSIS — M5441 Lumbago with sciatica, right side: Secondary | ICD-10-CM

## 2020-05-16 DIAGNOSIS — R634 Abnormal weight loss: Secondary | ICD-10-CM

## 2020-05-16 MED ORDER — TRAMADOL HCL 50 MG PO TABS: 50.0000 mg | ORAL_TABLET | Freq: Four times a day (QID) | ORAL | 0 refills | Status: DC | PRN

## 2020-05-16 NOTE — Progress Notes (Signed)
This visit occurred during the SARS-CoV-2 public health emergency.  Safety protocols were in place, including screening questions prior to the visit, additional usage of staff PPE, and extensive cleaning of exam room while observing appropriate contact time as indicated for disinfecting solutions.  Subjective:     Patient ID: Connor Andrews , male    DOB: March 25, 1971 , 49 y.o.   MRN: [id number]   Chief Complaint  Patient presents with   Back Pain    patient stated his whole right side has been hurting and its starting to radiate to the left side. he stated he is unable to hold anything in his hand     HPI  Back Pain This is a chronic problem. The current episode started more than 1 year ago. The pain is present in the lumbar spine. Pertinent negatives include no headaches.     Past Medical History:  Diagnosis Date   Arthritis      Family History  Problem Relation Age of Onset   Hypertension Mother    Diabetes Mother    Hypertension Father      Current Outpatient Medications:    gabapentin (NEURONTIN) 300 MG capsule, Take 1 capsule (300 mg total) by mouth 3 (three) times daily., Disp: 90 capsule, Rfl: 2   meloxicam (MOBIC) 7.5 MG tablet, Take 1 tablet (7.5 mg total) by mouth daily., Disp: 30 tablet, Rfl: 2   mirtazapine (REMERON) 15 MG tablet, Take 1 tablet (15 mg total) by mouth at bedtime., Disp: 30 tablet, Rfl: 2   traMADol (ULTRAM) 50 MG tablet, Take 1 tablet (50 mg total) by mouth every 6 (six) hours as needed., Disp: 20 tablet, Rfl: 0   No Known Allergies   Review of Systems  Constitutional: Negative.  Negative for fatigue.  Respiratory: Negative for cough.   Musculoskeletal: Positive for back pain. Negative for neck pain.  Neurological: Positive for syncope. Negative for dizziness and headaches.  Psychiatric/Behavioral: Negative.      Today's Vitals   05/16/20 1614  BP: 132/80  Pulse: 89  Temp: 98.4 F (36.9 C)  TempSrc: Oral  Weight: 134 lb (60.8  kg)  Height: _0  (1.753 m)  PainSc: 10-Worst pain ever  PainLoc: Back   Body mass index is 19.79 kg/m.   Objective:  Physical Exam Constitutional:      General: He is not in acute distress.    Appearance: Normal appearance.  Cardiovascular:     Rate and Rhythm: Normal rate and regular rhythm.     Pulses: Normal pulses.     Heart sounds: Normal heart sounds. No murmur heard.   Pulmonary:     Effort: Pulmonary effort is normal. No respiratory distress.     Breath sounds: Normal breath sounds.  Neurological:     Mental Status: He is alert and oriented to person, place, and time.     Cranial Nerves: No cranial nerve deficit.     Sensory: Sensory deficit present.     Motor: Weakness present.     Coordination: Coordination abnormal.  Psychiatric:        Mood and Affect: Mood normal.        Behavior: Behavior normal.        Thought Content: Thought content normal.        Judgment: Judgment normal.         Assessment And Plan:     1. Acute right-sided low back pain with right-sided sciatica Comments: contiued back pain with numbness to right leg  I have tried several times to have an MRI and CT scan he does not have insurance currently and is in the process of getting medicaid.  I have also referred him to neurology and he did not go he states he did not get a call but they left several voicemails. He said he has a full voicemail and is unable to get in.  - MR Lumbar Spine Wo Contrast; Future - CBC - CMP14+EGFR - Ambulatory referral to Neurology - traMADol (ULTRAM) 50 MG tablet; Take 1 tablet (50 mg total) by mouth every 6 (six) hours as needed.  Dispense: 20 tablet; Refill: 0  2. Numbness and tingling in right hand Comments: slight swelling noted to his right hand decreased strength - MR Lumbar Spine Wo Contrast; Future - CBC  3. Use of cane as ambulatory aid  4. Syncope, unspecified syncope type Comments: will check CBC unknown if LOC  - CBC - CMP14+EGFR  5.  Arthralgia of right hand Comments: - will treat with Tramadol as needed - ANA, IFA (with reflex) - traMADol (ULTRAM) 50 MG tablet; Take 1 tablet (50 mg total) by mouth every 6 (six) hours as needed.  Dispense: 20 tablet; Refill: 0     Patient was given opportunity to ask questions. Patient verbalized understanding of the plan and was able to repeat key elements of the plan. All questions were answered to their satisfaction.  [name], FNP   I, [name], FNP, have reviewed all documentation for this visit. The documentation on 05/16/20 for the exam, diagnosis, procedures, and orders are all accurate and complete.  THE PATIENT IS ENCOURAGED TO PRACTICE SOCIAL DISTANCING DUE TO THE COVID-19 PANDEMIC.

## 2020-05-16 NOTE — Patient Instructions (Addendum)
(  [phone] - [facility]  [phone] - Dr. Loreta Ave - Gastroenterology (stomach provider)   I have placed a referral for neurology and GI. I have also ordered a CT scan of the head and MRI of the low back.    Be sure to clear your messages so if you get a phone call they can leave a message.

## 2020-05-17 ENCOUNTER — Ambulatory Visit (INDEPENDENT_AMBULATORY_CARE_PROVIDER_SITE_OTHER): Payer: Self-pay

## 2020-05-17 DIAGNOSIS — Z23 Encounter for immunization: Secondary | ICD-10-CM

## 2020-05-17 LAB — CMP14+EGFR
ALT: 15 IU/L (ref 0–44)
AST: 18 IU/L (ref 0–40)
Albumin/Globulin Ratio: 2.7 — ABNORMAL HIGH (ref 1.2–2.2)
Albumin: 5.1 g/dL — ABNORMAL HIGH (ref 4.0–5.0)
Alkaline Phosphatase: 71 IU/L (ref 48–121)
BUN/Creatinine Ratio: 15 (ref 9–20)
BUN: 11 mg/dL (ref 6–24)
Bilirubin Total: 0.6 mg/dL (ref 0.0–1.2)
CO2: 24 mmol/L (ref 20–29)
Calcium: 9.9 mg/dL (ref 8.7–10.2)
Chloride: 101 mmol/L (ref 96–106)
Creatinine, Ser: 0.75 mg/dL — ABNORMAL LOW (ref 0.76–1.27)
GFR calc Af Amer: 124 mL/min/{1.73_m2} (ref >59)
GFR calc non Af Amer: 107 mL/min/{1.73_m2} (ref >59)
Globulin, Total: 1.9 g/dL (ref 1.5–4.5)
Glucose: 94 mg/dL (ref 65–99)
Potassium: 4.5 mmol/L (ref 3.5–5.2)
Sodium: 138 mmol/L (ref 134–144)
Total Protein: 7 g/dL (ref 6.0–8.5)

## 2020-05-17 LAB — CBC
Hematocrit: 41.1 % (ref 37.5–51.0)
Hemoglobin: 13.9 g/dL (ref 13.0–17.7)
MCH: 32 pg (ref 26.6–33.0)
MCHC: 33.8 g/dL (ref 31.5–35.7)
MCV: 95 fL (ref 79–97)
Platelets: 287 10*3/uL (ref 150–450)
RBC: 4.35 x10E6/uL (ref 4.14–5.80)
RDW: 12 % (ref 11.6–15.4)
WBC: 6.3 10*3/uL (ref 3.4–10.8)

## 2020-05-17 LAB — ANTINUCLEAR ANTIBODIES, IFA: ANA Titer 1: NEGATIVE

## 2020-05-17 NOTE — Progress Notes (Signed)
   Covid-19 Vaccination Clinic  Name:  Connor Andrews    MRN: [id number] DOB: 01/31/1971  05/17/2020  Mr. [name] was observed post Covid-19 immunization for 15 minutes without incident. He was provided with Vaccine Information Sheet and instruction to access the V-Safe system.   Mr. [name] was instructed to call 911 with any severe reactions post vaccine: Marland Kitchen Difficulty breathing  . Swelling of face and throat  . A fast heartbeat  . A bad rash all over body  . Dizziness and weakness   Immunizations Administered    Name Date Dose VIS Date Route   Moderna COVID-19 Vaccine 05/17/2020 10:17 AM 0.5 mL 09/2019 Intramuscular   Manufacturer: Gala Murdoch   Lot: [id number]   NDC: 19166-060-04

## 2020-05-20 ENCOUNTER — Ambulatory Visit: Payer: Self-pay

## 2020-05-20 DIAGNOSIS — R634 Abnormal weight loss: Secondary | ICD-10-CM

## 2020-05-20 DIAGNOSIS — R55 Syncope and collapse: Secondary | ICD-10-CM

## 2020-05-20 DIAGNOSIS — M5441 Lumbago with sciatica, right side: Secondary | ICD-10-CM

## 2020-05-20 NOTE — Chronic Care Management (AMB) (Signed)
  Chronic Care Management   Outreach Note  05/20/2020 Name: Connor Andrews MRN: [id number] DOB: 1971-08-05  Referred by: Arnette Felts, FNP Reason for referral : Care Coordination   SW received referral from patient primary provider to assist the patient in applying for Medicaid. SW performed chart review to note patient does not have at least two chronic health conditions to qualify for embedded care management program. SW collaborated with primary care provider to confirm patient needs. Patient referred to community resource care guide team for care coordination assistance.  Follow Up Plan: No SW follow up planned at this time. The patient will be engaged by the care guide team.  Bevelyn Ngo, BSW, CDP Social Worker, Certified Dementia Practitioner [facility] Care Management [phone]

## 2020-05-29 ENCOUNTER — Telehealth: Payer: Self-pay | Admitting: Nurse Practitioner

## 2020-05-30 ENCOUNTER — Ambulatory Visit: Payer: Self-pay | Admitting: Nurse Practitioner

## 2020-05-31 ENCOUNTER — Telehealth: Payer: Self-pay | Admitting: Nurse Practitioner

## 2020-06-03 ENCOUNTER — Telehealth: Payer: Self-pay | Admitting: Nurse Practitioner

## 2020-06-09 ENCOUNTER — Other Ambulatory Visit: Payer: Self-pay

## 2020-06-10 ENCOUNTER — Telehealth: Payer: Self-pay | Admitting: Nurse Practitioner

## 2020-06-10 ENCOUNTER — Ambulatory Visit: Payer: Self-pay | Admitting: Nurse Practitioner

## 2020-06-10 NOTE — Telephone Encounter (Signed)
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  As we were talking the phone lost signal attempted to call back went to voicemail and left a message

## 2020-06-14 ENCOUNTER — Ambulatory Visit (INDEPENDENT_AMBULATORY_CARE_PROVIDER_SITE_OTHER): Payer: Self-pay

## 2020-06-14 DIAGNOSIS — Z23 Encounter for immunization: Secondary | ICD-10-CM

## 2020-06-14 NOTE — Progress Notes (Signed)
   Covid-19 Vaccination Clinic  Name:  Connor Andrews    MRN: [id number] DOB: 07/16/1971  06/14/2020  Connor Andrews was observed post Covid-19 immunization for 15 minutes without incident. He was provided with Vaccine Information Sheet and instruction to access the V-Safe system.   Connor Andrews was instructed to call 911 with any severe reactions post vaccine: Marland Kitchen Difficulty breathing  . Swelling of face and throat  . A fast heartbeat  . A bad rash all over body  . Dizziness and weakness   Immunizations Administered    Name Date Dose VIS Date Route   Moderna COVID-19 Vaccine 06/14/2020  9:41 AM 0.5 mL 09/2019 Intramuscular   Manufacturer: Moderna   Lot: [id number]   NDC: 41638-453-64

## 2020-07-22 ENCOUNTER — Telehealth: Payer: Self-pay | Admitting: Diagnostic Neuroimaging

## 2020-07-22 ENCOUNTER — Other Ambulatory Visit: Payer: Self-pay

## 2020-07-22 ENCOUNTER — Encounter: Payer: Self-pay | Admitting: Diagnostic Neuroimaging

## 2020-07-22 ENCOUNTER — Ambulatory Visit: Payer: Self-pay | Admitting: Diagnostic Neuroimaging

## 2020-07-22 VITALS — BP 156/105 | HR 76 | Ht 69.0 in | Wt 134.5 lb

## 2020-07-22 DIAGNOSIS — M542 Cervicalgia: Secondary | ICD-10-CM

## 2020-07-22 NOTE — Telephone Encounter (Signed)
self pay order sent to GI. They will reach out to the patient to schedule.  °

## 2020-07-22 NOTE — Patient Instructions (Signed)
  NECK PAIN / RIGHT ARM WEAKNESS / NUMBNESS - check MRI cervical spine  LOW BACK PAIN - musculoskeletal; arthritis; supportive care, PT, exercises  UNEXPLAINED WEIGHT LOSS - follow up PCP; consider GI evaluation  SOCIAL DETERMINANTS OF HEALTH - uninsured status; refer to [facility] financial assistance office

## 2020-07-22 NOTE — Progress Notes (Signed)
[facility]  PATIENT: Connor Andrews DOB: Feb 20, 1971  REFERRING CLINICIAN: Arnette Felts, FNP HISTORY FROM: patient  REASON FOR VISIT: new consult    HISTORICAL  CHIEF COMPLAINT:  Chief Complaint  Patient presents with   Right-sided low back pain    rm 6 New Pt "low back pain x 1 year or more, was maybe work related; pain is now in my right shoulder/arm/hand, feels like it's asleep all the time; hand tenses up, I drop objects"    HISTORY OF PRESENT ILLNESS:   49 year old male here for evaluation of low back pain.  Patient has had 1 year of neck pain, low back pain, right upper extremity and right lower extremity numbness, pain and weakness.  His right hand is clenched and having weakness.  He has numbness in his right hand.  He is dropping things.  He is not able to control his right hand.  Patient had MRI of the lumbar spine which showed some degenerative changes at L4-5 and L5-S1 without significant neural foraminal stenosis.   REVIEW OF SYSTEMS: Full 14 system review of systems performed and negative with exception of: as per HPI.    ALLERGIES: No Known Allergies  HOME MEDICATIONS: Outpatient Medications Prior to Visit  Medication Sig Dispense Refill   gabapentin (NEURONTIN) 300 MG capsule Take 1 capsule (300 mg total) by mouth 3 (three) times daily. 90 capsule 2   meloxicam (MOBIC) 7.5 MG tablet Take 1 tablet (7.5 mg total) by mouth daily. 30 tablet 2   mirtazapine (REMERON) 15 MG tablet Take 1 tablet (15 mg total) by mouth at bedtime. 30 tablet 2   traMADol (ULTRAM) 50 MG tablet Take 1 tablet (50 mg total) by mouth every 6 (six) hours as needed. 20 tablet 0   No facility-administered medications prior to visit.    PAST MEDICAL HISTORY: Past Medical History:  Diagnosis Date   Arthritis    right hip   Low back pain    right side    PAST SURGICAL HISTORY: No past surgical history on file.  FAMILY HISTORY: Family History  Problem  Relation Age of Onset   Hypertension Mother    Diabetes Mother    Hypertension Father     SOCIAL HISTORY: Social History   Socioeconomic History   Marital status: Married    Spouse name: Duwayne Heck   Number of children: 2   Years of education: Not on file   Highest education level: GED or equivalent  Occupational History    Comment: 07/22/20 out of work d/t symptoms  Tobacco Use   Smoking status: Current Every Day Smoker    Packs/day: 1.00    Types: Cigarettes   Smokeless tobacco: Never Used   Tobacco comment: 07/22/20 1 pk a week  Vaping Use   Vaping Use: Never used  Substance and Sexual Activity   Alcohol use: Yes    Comment: weekends only   Drug use: No   Sexual activity: Not on file  Other Topics Concern   Not on file  Social History Narrative   Lives with wife   Social Determinants of Health   Financial Resource Strain:    Difficulty of Paying Living Expenses: Not on file  Food Insecurity:    Worried About Programme researcher, broadcasting/film/video in the Last Year: Not on file   [employer] of Food in the Last Year: Not on file  Transportation Needs:    Lack of Transportation (Medical): Not on file   Lack of  Transportation (Non-Medical): Not on file  Physical Activity:    Days of Exercise per Week: Not on file   Minutes of Exercise per Session: Not on file  Stress:    Feeling of Stress : Not on file  Social Connections:    Frequency of Communication with Friends and Family: Not on file   Frequency of Social Gatherings with Friends and Family: Not on file   Attends Religious Services: Not on file   Active Member of Clubs or Organizations: Not on file   Attends Banker Meetings: Not on file   Marital Status: Not on file  Intimate Partner Violence:    Fear of Current or Ex-Partner: Not on file   Emotionally Abused: Not on file   Physically Abused: Not on file   Sexually Abused: Not on file     PHYSICAL EXAM  GENERAL  EXAM/CONSTITUTIONAL: Vitals:  Vitals:   07/22/20 0843 07/22/20 0855  BP: (!) 157/106 (!) 156/105  Pulse: 76   Weight: 134 lb 8 oz (61 kg)   Height: 5\' 9"  (1.753 m)      Body mass index is 19.86 kg/m. Wt Readings from Last 3 Encounters:  07/22/20 134 lb 8 oz (61 kg)  05/16/20 134 lb (60.8 kg)  01/15/20 136 lb 12.8 oz (62.1 kg)     Patient is in no distress; well developed, nourished and groomed; neck is supple  CARDIOVASCULAR:  Examination of carotid arteries is normal; no carotid bruits  Regular rate and rhythm, no murmurs  Examination of peripheral vascular system by observation and palpation is normal  EYES:  Ophthalmoscopic exam of optic discs and posterior segments is normal; no papilledema or hemorrhages  No exam data present  MUSCULOSKELETAL:  Gait, strength, tone, movements noted in Neurologic exam below  NEUROLOGIC: MENTAL STATUS:  No flowsheet data found.  awake, alert, oriented to person, place and time  recent and remote memory intact  normal attention and concentration  language fluent, comprehension intact, naming intact  fund of knowledge appropriate  CRANIAL NERVE:   2nd - no papilledema on fundoscopic exam  2nd, 3rd, 4th, 6th - pupils equal and reactive to light, visual fields full to confrontation, extraocular muscles intact, no nystagmus  5th - facial sensation symmetric  7th - facial strength symmetric  8th - hearing intact  9th - palate elevates symmetrically, uvula midline  11th - shoulder shrug symmetric  12th - tongue protrusion midline  MOTOR:   normal bulk and tone, full strength in the LUE,LLE; RUE 4 PROX, 4 DISTAL; RLE 4+  SENSORY:   normal and symmetric to light touch, temperature, vibration; EXCEPT DECR IN RIGHT UPPER EXT   COORDINATION:   finger-nose-finger, fine finger movements normal  REFLEXES:   deep tendon reflexes TRACE and symmetric  GAIT/STATION:   narrow based gait     DIAGNOSTIC DATA  (LABS, IMAGING, TESTING) - I reviewed patient records, labs, notes, testing and imaging myself where available.  Lab Results  Component Value Date   WBC 6.3 05/16/2020   HGB 13.9 05/16/2020   HCT 41.1 05/16/2020   MCV 95 05/16/2020   PLT 287 05/16/2020      Component Value Date/Time   NA 138 05/16/2020 1725   K 4.5 05/16/2020 1725   CL 101 05/16/2020 1725   CO2 24 05/16/2020 1725   GLUCOSE 94 05/16/2020 1725   GLUCOSE 98 02/11/2016 1005   BUN 11 05/16/2020 1725   CREATININE 0.75 (L) 05/16/2020 1725   CALCIUM 9.9  05/16/2020 1725   PROT 7.0 05/16/2020 1725   ALBUMIN 5.1 (H) 05/16/2020 1725   AST 18 05/16/2020 1725   ALT 15 05/16/2020 1725   ALKPHOS 71 05/16/2020 1725   BILITOT 0.6 05/16/2020 1725   GFRNONAA 107 05/16/2020 1725   GFRAA 124 05/16/2020 1725   No results found for: CHOL, HDL, LDLCALC, LDLDIRECT, TRIG, CHOLHDL Lab Results  Component Value Date   HGBA1C 4.6 (L) 05/31/2019   Lab Results  Component Value Date   VITAMINB12 566 05/31/2019   Lab Results  Component Value Date   TSH 0.538 05/31/2019    06/07/20 MRI lumbar spine  -Mild degenerative changes at L4-5 and L5-S1    ASSESSMENT AND PLAN  49 y.o. year old male here with:  Dx:  1. Neck pain      PLAN:  NECK PAIN / RIGHT ARM WEAKNESS / NUMBNESS - check MRI cervical spine (rule out demyelinating disease) - consider EMG/NCS (rule out neuropathy)  LOW BACK PAIN - musculoskeletal; arthritis; supportive care, PT, exercises  UNEXPLAINED WEIGHT LOSS - follow up PCP; consider GI evaluation  SOCIAL DETERMINANTS OF HEALTH - uninsured status; refer to [facility] financial assistance office  Orders Placed This Encounter  Procedures   MR CERVICAL SPINE W WO CONTRAST   Return pending testing.    Suanne Marker, MD 07/22/2020, 10:00 AM Certified in Neurology, Neurophysiology and Neuroimaging  [facility] [address], [address] [place], [place] [address] [phone]

## 2020-07-24 ENCOUNTER — Encounter: Payer: Self-pay | Admitting: Nurse Practitioner

## 2020-07-24 ENCOUNTER — Other Ambulatory Visit: Payer: Self-pay

## 2020-07-24 ENCOUNTER — Ambulatory Visit: Payer: Self-pay | Admitting: Nurse Practitioner

## 2020-07-24 VITALS — BP 150/100 | HR 65 | Temp 97.4°F | Ht 67.4 in | Wt 135.0 lb

## 2020-07-24 DIAGNOSIS — I10 Essential (primary) hypertension: Secondary | ICD-10-CM

## 2020-07-24 DIAGNOSIS — R200 Anesthesia of skin: Secondary | ICD-10-CM

## 2020-07-24 DIAGNOSIS — R202 Paresthesia of skin: Secondary | ICD-10-CM

## 2020-07-24 DIAGNOSIS — M62838 Other muscle spasm: Secondary | ICD-10-CM

## 2020-07-24 MED ORDER — AMLODIPINE BESYLATE 2.5 MG PO TABS: 2.5000 mg | ORAL_TABLET | Freq: Every day | ORAL | 2 refills | Status: DC

## 2020-07-24 MED ORDER — CYCLOBENZAPRINE HCL 10 MG PO TABS: 10.0000 mg | ORAL_TABLET | Freq: Three times a day (TID) | ORAL | 0 refills | Status: DC | PRN

## 2020-07-24 NOTE — Patient Instructions (Addendum)

## 2020-07-24 NOTE — Progress Notes (Signed)
I,Yamilka Roman [employer] as a Neurosurgeon for [employer], FNP.,have documented all relevant documentation on the behalf of Arnette Felts, FNP,as directed by  Arnette Felts, FNP while in the presence of Arnette Felts, FNP. This visit occurred during the SARS-CoV-2 public health emergency.  Safety protocols were in place, including screening questions prior to the visit, additional usage of staff PPE, and extensive cleaning of exam room while observing appropriate contact time as indicated for disinfecting solutions.  Subjective:     Patient ID: Connor Andrews , male    DOB: [birth year] , 49 y.o.   MRN: [id number]   Chief Complaint  Patient presents with  . elavated blood pressure  . Numbness    patient is having tingling pain in his arm radiating down to his hand     HPI  He is here today due to having an elevated blood pressure when he was at the neurologist and again today.  He continues to have tingling and pain down his neck, right arm and right leg.  MRI is scheduled for October 23rd.  He is having new tingling to his right arm and right leg but will "jump" to the left side.  He did mention to the Neurologist the symptoms he is having on Monday Oct 11 th.    He denies having taking any medications for elevated blood pressure.      Past Medical History:  Diagnosis Date  . Arthritis    right hip  . Low back pain    right side     Family History  Problem Relation Age of Onset  . Hypertension Mother   . Diabetes Mother   . Hypertension Father      Current Outpatient Medications:  .  gabapentin (NEURONTIN) 300 MG capsule, Take 1 capsule (300 mg total) by mouth 3 (three) times daily., Disp: 90 capsule, Rfl: 2 .  meloxicam (MOBIC) 7.5 MG tablet, Take 1 tablet (7.5 mg total) by mouth daily., Disp: 30 tablet, Rfl: 2 .  traMADol (ULTRAM) 50 MG tablet, Take 1 tablet (50 mg total) by mouth every 6 (six) hours as needed., Disp: 20 tablet, Rfl: 0 .  amLODipine (NORVASC) 2.5 MG tablet,  Take 1 tablet (2.5 mg total) by mouth daily., Disp: 30 tablet, Rfl: 2 .  cyclobenzaprine (FLEXERIL) 10 MG tablet, Take 1 tablet (10 mg total) by mouth 3 (three) times daily as needed for muscle spasms., Disp: 30 tablet, Rfl: 0   No Known Allergies   Review of Systems  Constitutional: Positive for appetite change and unexpected weight change.  Respiratory: Negative.   Cardiovascular: Negative.  Negative for chest pain, palpitations and leg swelling.  Psychiatric/Behavioral: Negative.      Today's Vitals   07/24/20 0854  BP: (!) 150/100  Pulse: 65  Temp: (!) 97.4 F (36.3 C)  TempSrc: Oral  Weight: 135 lb (61.2 kg)  Height: 5' 7.4" (1.712 m)  PainSc: 9   PainLoc: Arm   Body mass index is 20.89 kg/m.   Objective:  Physical Exam Constitutional:      General: He is not in acute distress.    Appearance: Normal appearance.  Musculoskeletal:        General: No tenderness.  Skin:    Capillary Refill: Capillary refill takes less than 2 seconds.  Neurological:     General: No focal deficit present.     Mental Status: He is alert and oriented to person, place, and time.     Cranial Nerves: No cranial nerve  deficit.  Psychiatric:        Mood and Affect: Mood normal.        Behavior: Behavior normal.        Thought Content: Thought content normal.        Judgment: Judgment normal.         Assessment And Plan:     1. Essential hypertension  This is new onset, I will start him on low dose amlodipine   He is advised to avoid a high salt diet and to stay well hydrated  He will return in 4 weeks for medication and blood pressure check - amLODipine (NORVASC) 2.5 MG tablet; Take 1 tablet (2.5 mg total) by mouth daily.  Dispense: 30 tablet; Refill: 2  2. Muscle spasm - cyclobenzaprine (FLEXERIL) 10 MG tablet; Take 1 tablet (10 mg total) by mouth 3 (three) times daily as needed for muscle spasms.  Dispense: 30 tablet; Refill: 0   3. Numbness and tingling in right hand  He  has seen the Neurologist and is to have another MRI   I have discussed with him that finding out what is going on may take some steps and to continue with the gabapentin for the tingling  I have also discussed with him to quit smoking as this can cause pain to be worse  Patient was given opportunity to ask questions. Patient verbalized understanding of the plan and was able to repeat key elements of the plan. All questions were answered to their satisfaction.   Jeanell Sparrow, FNP, have reviewed all documentation for this visit. The documentation on 08/07/20 for the exam, diagnosis, procedures, and orders are all accurate and complete.  THE PATIENT IS ENCOURAGED TO PRACTICE SOCIAL DISTANCING DUE TO THE COVID-19 PANDEMIC.

## 2020-08-02 ENCOUNTER — Other Ambulatory Visit: Payer: Self-pay

## 2020-08-22 ENCOUNTER — Encounter: Payer: Self-pay | Admitting: Nurse Practitioner

## 2020-08-22 NOTE — Patient Instructions (Signed)

## 2020-08-22 NOTE — Progress Notes (Signed)
  This visit occurred during the SARS-CoV-2 public health emergency.  Safety protocols were in place, including screening questions prior to the visit, additional usage of staff PPE, and extensive cleaning of exam room while observing appropriate contact time as indicated for disinfecting solutions.  Subjective:     Patient ID: Connor Andrews , male    DOB: 08-10-1971 , 49 y.o.   MRN: [id number]   Chief Complaint  Patient presents with  . Hypertension    patient here for a blood pressure check.    HPI  Patient here for a 4 week med check for his blood pressure.  Hypertension     Past Medical History:  Diagnosis Date  . Arthritis    right hip  . Low back pain    right side     Family History  Problem Relation Age of Onset  . Hypertension Mother   . Diabetes Mother   . Hypertension Father      Current Outpatient Medications:  .  amLODipine (NORVASC) 2.5 MG tablet, Take 1 tablet (2.5 mg total) by mouth daily., Disp: 30 tablet, Rfl: 2 .  cyclobenzaprine (FLEXERIL) 10 MG tablet, Take 1 tablet (10 mg total) by mouth 3 (three) times daily as needed for muscle spasms., Disp: 30 tablet, Rfl: 0 .  gabapentin (NEURONTIN) 300 MG capsule, Take 1 capsule (300 mg total) by mouth 3 (three) times daily., Disp: 90 capsule, Rfl: 2 .  meloxicam (MOBIC) 7.5 MG tablet, Take 1 tablet (7.5 mg total) by mouth daily., Disp: 30 tablet, Rfl: 2 .  traMADol (ULTRAM) 50 MG tablet, Take 1 tablet (50 mg total) by mouth every 6 (six) hours as needed., Disp: 20 tablet, Rfl: 0   No Known Allergies   Review of Systems   There were no vitals filed for this visit. There is no height or weight on file to calculate BMI.   Objective:  Physical Exam      Assessment And Plan:     1. Essential hypertension     Patient was given opportunity to ask questions. Patient verbalized understanding of the plan and was able to repeat key elements of the plan. All questions were answered to their satisfaction.   [address] [place], CMA   I, [place], [place], have reviewed all documentation for this visit. The documentation on 08/22/20 for the exam, diagnosis, procedures, and orders are all accurate and complete.  THE PATIENT IS ENCOURAGED TO PRACTICE SOCIAL DISTANCING DUE TO THE COVID-19 PANDEMIC.

## 2020-11-25 ENCOUNTER — Other Ambulatory Visit: Payer: Self-pay | Admitting: Nurse Practitioner

## 2020-11-25 DIAGNOSIS — I10 Essential (primary) hypertension: Secondary | ICD-10-CM

## 2021-01-21 ENCOUNTER — Ambulatory Visit
Admission: EM | Admit: 2021-01-21 | Discharge: 2021-01-21 | Disposition: A | Discharge status: Home / Self Care | Payer: Self-pay | Attending: Emergency Medicine | Admitting: Emergency Medicine

## 2021-01-21 ENCOUNTER — Other Ambulatory Visit: Payer: Self-pay

## 2021-01-21 DIAGNOSIS — M542 Cervicalgia: Secondary | ICD-10-CM

## 2021-01-21 DIAGNOSIS — I10 Essential (primary) hypertension: Secondary | ICD-10-CM

## 2021-01-21 DIAGNOSIS — R200 Anesthesia of skin: Secondary | ICD-10-CM

## 2021-01-21 DIAGNOSIS — M79601 Pain in right arm: Secondary | ICD-10-CM

## 2021-01-21 HISTORY — DX: Essential (primary) hypertension: I10

## 2021-01-21 MED ORDER — PREDNISONE 10 MG PO TABS
ORAL_TABLET | ORAL | 0 refills | Status: DC
Start: 2021-01-21 — End: 2021-04-11

## 2021-01-21 MED ORDER — TIZANIDINE HCL 4 MG PO TABS: 2.0000 mg | ORAL_TABLET | Freq: Four times a day (QID) | ORAL | 0 refills | Status: DC | PRN

## 2021-01-21 MED ORDER — AMLODIPINE BESYLATE 5 MG PO TABS: 5.0000 mg | ORAL_TABLET | Freq: Every day | ORAL | 0 refills | Status: DC

## 2021-01-21 MED ORDER — KETOROLAC TROMETHAMINE 30 MG/ML IJ SOLN
30.0000 mg | Freq: Once | INTRAMUSCULAR | Status: AC
  Administered 2021-01-21: 30 mg via INTRAMUSCULAR

## 2021-01-21 NOTE — Discharge Instructions (Addendum)
We gave you a shot of Toradol Begin prednisone taper x6 days-begin with 6 tablets on day 1, decrease by 1 tablet each day until complete-6, 5, 4, 3, 2, 1-take with food and earlier in the day if possible May supplement with tizanidine which is a muscle relaxer at home/bedtime, do not drive or work after taking as this may cause drowsiness Please follow-up with orthopedics/sports medicine-contact below Go to emergency room if symptoms worsening  Restart amlodipine daily and monitor blood pressure Please establish care with primary care for further monitoring and treatment of blood pressure

## 2021-01-21 NOTE — ED Provider Notes (Signed)
[facility]    CSN: [id number] Arrival date & time: 01/21/21  7782      History   Chief Complaint Chief Complaint  Patient presents with  . hand numbness    HPI Connor Andrews is a 50 y.o. male history of hypertension, presenting today for evaluation of neck and arm pain/numbness.  Patient reports that he has had chronic neck and arm pain for months.  Reports MRI 2 months ago showing cyst on C7.  He has had associated right arm/hand numbness tingling and pain associated with this which is worsened over the past 4 to 5 days.  Feels limited in his ability to do his job.  Denies associated headaches vision changes, dizziness or lightheadedness.  Denies any right leg numbness tingling or pain.  Does report bilateral foot paresthesias.  HPI  Past Medical History:  Diagnosis Date  . Arthritis    right hip  . Hypertension   . Low back pain    right side    There are no problems to display for this patient.   History reviewed. No pertinent surgical history.     Home Medications    Prior to Admission medications   Medication Sig Start Date End Date Taking? Authorizing Provider  predniSONE (DELTASONE) 10 MG tablet Begin with 6 tabs on day 1, 5 tab on day 2, 4 tab on day 3, 3 tab on day 4, 2 tab on day 5, 1 tab on day 6-take with food 01/21/21  Yes Donneisha Beane C, PA-C  tiZANidine (ZANAFLEX) 4 MG tablet Take 0.5-1 tablets (2-4 mg total) by mouth every 6 (six) hours as needed for muscle spasms. 01/21/21  Yes Savannah Morford C, PA-C  amLODipine (NORVASC) 5 MG tablet Take 1 tablet (5 mg total) by mouth daily. 01/21/21 04/21/21  Nathin Saran C, PA-C  gabapentin (NEURONTIN) 300 MG capsule Take 1 capsule (300 mg total) by mouth 3 (three) times daily. 01/15/20 01/14/21  Arnette Felts, FNP  traMADol (ULTRAM) 50 MG tablet Take 1 tablet (50 mg total) by mouth every 6 (six) hours as needed. 05/16/20 05/16/21  Arnette Felts, FNP    Family History Family History  Problem Relation Age  of Onset  . Hypertension Mother   . Diabetes Mother   . Hypertension Father     Social History Social History   Tobacco Use  . Smoking status: Current Every Day Smoker    Packs/day: 1.00    Types: Cigarettes  . Smokeless tobacco: Never Used  . Tobacco comment: 07/22/20 1 pk a week   Vaping Use  . Vaping Use: Never used  Substance Use Topics  . Alcohol use: Yes    Comment: weekends only  . Drug use: No     Allergies   Patient has no known allergies.   Review of Systems Review of Systems  Constitutional: Negative for fatigue and fever.  Eyes: Negative for redness, itching and visual disturbance.  Respiratory: Negative for shortness of breath.   Cardiovascular: Negative for chest pain and leg swelling.  Gastrointestinal: Negative for nausea and vomiting.  Musculoskeletal: Positive for arthralgias, myalgias and neck pain.  Skin: Negative for color change, rash and wound.  Neurological: Negative for dizziness, syncope, weakness, light-headedness and headaches.     Physical Exam Triage Vital Signs ED Triage Vitals [01/21/21 0854]  Enc Vitals Group     BP (!) 171/118     Pulse Rate 80     Resp 18     Temp 97.8 F (  36.6 C)     Temp Source Oral     SpO2 97 %     Weight      Height      Head Circumference      Peak Flow      Pain Score 10     Pain Loc      Pain Edu?      Excl. in GC?    No data found.  Updated Vital Signs BP (!) 171/118 (BP Location: Left Arm)   Pulse 80   Temp 97.8 F (36.6 C) (Oral)   Resp 18   SpO2 97%   Visual Acuity Right Eye Distance:   Left Eye Distance:   Bilateral Distance:    Right Eye Near:   Left Eye Near:    Bilateral Near:     Physical Exam Vitals and nursing note reviewed.  Constitutional:      Appearance: He is well-developed.     Comments: No acute distress  HENT:     Head: Normocephalic and atraumatic.     Nose: Nose normal.  Eyes:     Conjunctiva/sclera: Conjunctivae normal.  Cardiovascular:     Rate  and Rhythm: Normal rate.  Pulmonary:     Effort: Pulmonary effort is normal. No respiratory distress.  Abdominal:     General: There is no distension.  Musculoskeletal:        General: Normal range of motion.     Cervical back: Neck supple.     Comments: Nontender to palpation along cervical spine midline, diffuse tenderness throughout right cervical musculature extending into right arm, more prominent tenderness noted into right forearm and hand, full active range of motion of shoulder elbow and wrist  Shoulder strength 5/5 and equal bilaterally, elbow strength 5/5 and equal bilaterally, right for strength 4/5 compared to left, grip strength 5/5 and equal to right, radial pulse 2+  Skin:    General: Skin is warm and dry.  Neurological:     Mental Status: He is alert and oriented to person, place, and time.      UC Treatments / Results  Labs (all labs ordered are listed, but only abnormal results are displayed) Labs Reviewed - No data to display  EKG   Radiology No results found.  Procedures Procedures (including critical care time)  Medications Ordered in UC Medications  ketorolac (TORADOL) 30 MG/ML injection 30 mg (30 mg Intramuscular Given 01/21/21 0940)    Initial Impression / Assessment and Plan / UC Course  I have reviewed the triage vital signs and the nursing notes.  Pertinent labs & imaging results that were available during my care of the patient were reviewed by me and considered in my medical decision making (see chart for details).     Acute on chronic neck and arm pain and numbness, no symptoms concerning for underlying stroke, will provide Toradol prior to discharge and initiate on prednisone taper along with muscle relaxers, stressed importance of follow-up with orthopedics/sports medicine for further evaluation and work-up of chronic neck pain along with his worsening symptoms of recently.  Hypertension-refilled amlodipine, blood pressure rechecked prior  to discharge and was 164/100.  Encouraged establishing care with PCP for further monitoring and management of this.  Discussed strict return precautions. Patient verbalized understanding and is agreeable with plan.  Final Clinical Impressions(s) / UC Diagnoses   Final diagnoses:  Right arm pain  Right arm numbness  Neck pain     Discharge Instructions  We gave you a shot of Toradol Begin prednisone taper x6 days-begin with 6 tablets on day 1, decrease by 1 tablet each day until complete-6, 5, 4, 3, 2, 1-take with food and earlier in the day if possible May supplement with tizanidine which is a muscle relaxer at home/bedtime, do not drive or work after taking as this may cause drowsiness Please follow-up with orthopedics/sports medicine-contact below Go to emergency room if symptoms worsening  Restart amlodipine daily and monitor blood pressure Please establish care with primary care for further monitoring and treatment of blood pressure    ED Prescriptions    Medication Sig Dispense Auth. Provider   amLODipine (NORVASC) 5 MG tablet Take 1 tablet (5 mg total) by mouth daily. 90 tablet Domini Vandehei C, PA-C   predniSONE (DELTASONE) 10 MG tablet Begin with 6 tabs on day 1, 5 tab on day 2, 4 tab on day 3, 3 tab on day 4, 2 tab on day 5, 1 tab on day 6-take with food 21 tablet Karlena Luebke C, PA-C   tiZANidine (ZANAFLEX) 4 MG tablet Take 0.5-1 tablets (2-4 mg total) by mouth every 6 (six) hours as needed for muscle spasms. 30 tablet Kanya Potteiger, [place] C, PA-C     PDMP not reviewed this encounter.   Lew Dawes, [place] 01/21/21 [id number]

## 2021-01-21 NOTE — ED Triage Notes (Signed)
Pt states had a MRI 2 months ago and has a cyst on C7. Pt c/o rt hand numbness that has worsen. States will need a work note.

## 2021-02-03 DIAGNOSIS — Z0271 Encounter for disability determination: Secondary | ICD-10-CM

## 2021-04-06 ENCOUNTER — Encounter (HOSPITAL_COMMUNITY): Payer: Self-pay | Admitting: *Deleted

## 2021-04-06 ENCOUNTER — Inpatient Hospital Stay (HOSPITAL_COMMUNITY)
Admission: EM | Admit: 2021-04-06 | Discharge: 2021-04-11 | DRG: 098 | Disposition: A | Discharge status: Home / Self Care | Payer: Self-pay | Attending: Internal Medicine | Admitting: Internal Medicine

## 2021-04-06 ENCOUNTER — Ambulatory Visit: Admission: EM | Admit: 2021-04-06 | Discharge: 2021-04-06 | Discharge status: Against Medical Advice | Payer: Self-pay

## 2021-04-06 ENCOUNTER — Other Ambulatory Visit: Payer: Self-pay

## 2021-04-06 ENCOUNTER — Emergency Department (HOSPITAL_COMMUNITY): Payer: Self-pay

## 2021-04-06 DIAGNOSIS — I10 Essential (primary) hypertension: Secondary | ICD-10-CM | POA: Diagnosis present

## 2021-04-06 DIAGNOSIS — G373 Acute transverse myelitis in demyelinating disease of central nervous system: Principal | ICD-10-CM | POA: Diagnosis present

## 2021-04-06 DIAGNOSIS — Z681 Body mass index (BMI) 19 or less, adult: Secondary | ICD-10-CM

## 2021-04-06 DIAGNOSIS — G629 Polyneuropathy, unspecified: Secondary | ICD-10-CM

## 2021-04-06 DIAGNOSIS — R29898 Other symptoms and signs involving the musculoskeletal system: Secondary | ICD-10-CM

## 2021-04-06 DIAGNOSIS — M25541 Pain in joints of right hand: Secondary | ICD-10-CM

## 2021-04-06 DIAGNOSIS — M79601 Pain in right arm: Secondary | ICD-10-CM | POA: Diagnosis present

## 2021-04-06 DIAGNOSIS — Z716 Tobacco abuse counseling: Secondary | ICD-10-CM

## 2021-04-06 DIAGNOSIS — F17210 Nicotine dependence, cigarettes, uncomplicated: Secondary | ICD-10-CM | POA: Diagnosis present

## 2021-04-06 DIAGNOSIS — M1990 Unspecified osteoarthritis, unspecified site: Secondary | ICD-10-CM | POA: Diagnosis present

## 2021-04-06 DIAGNOSIS — Z720 Tobacco use: Secondary | ICD-10-CM | POA: Diagnosis present

## 2021-04-06 DIAGNOSIS — R839 Unspecified abnormal finding in cerebrospinal fluid: Secondary | ICD-10-CM | POA: Diagnosis present

## 2021-04-06 DIAGNOSIS — M5441 Lumbago with sciatica, right side: Secondary | ICD-10-CM

## 2021-04-06 DIAGNOSIS — R531 Weakness: Secondary | ICD-10-CM | POA: Diagnosis present

## 2021-04-06 DIAGNOSIS — Z833 Family history of diabetes mellitus: Secondary | ICD-10-CM

## 2021-04-06 DIAGNOSIS — R52 Pain, unspecified: Secondary | ICD-10-CM

## 2021-04-06 DIAGNOSIS — Z20822 Contact with and (suspected) exposure to covid-19: Secondary | ICD-10-CM | POA: Diagnosis present

## 2021-04-06 DIAGNOSIS — D8989 Other specified disorders involving the immune mechanism, not elsewhere classified: Secondary | ICD-10-CM | POA: Diagnosis present

## 2021-04-06 DIAGNOSIS — R634 Abnormal weight loss: Secondary | ICD-10-CM | POA: Diagnosis present

## 2021-04-06 DIAGNOSIS — Z8249 Family history of ischemic heart disease and other diseases of the circulatory system: Secondary | ICD-10-CM

## 2021-04-06 DIAGNOSIS — M545 Low back pain, unspecified: Secondary | ICD-10-CM | POA: Diagnosis present

## 2021-04-06 LAB — CBC
HCT: 40.8 % (ref 39.0–52.0)
Hemoglobin: 13.7 g/dL (ref 13.0–17.0)
MCH: 32.5 pg (ref 26.0–34.0)
MCHC: 33.6 g/dL (ref 30.0–36.0)
MCV: 96.7 fL (ref 80.0–100.0)
Platelets: 288 10*3/uL (ref 150–400)
RBC: 4.22 MIL/uL (ref 4.22–5.81)
RDW: 12.5 % (ref 11.5–15.5)
WBC: 5.2 10*3/uL (ref 4.0–10.5)
nRBC: 0 % (ref 0.0–0.2)

## 2021-04-06 LAB — CSF CELL COUNT WITH DIFFERENTIAL
RBC Count, CSF: 0 /mm3
RBC Count, CSF: 4 /mm3 — ABNORMAL HIGH
Tube #: 1
Tube #: 4
WBC, CSF: 0 /mm3 (ref 0–5)
WBC, CSF: 2 /mm3 (ref 0–5)

## 2021-04-06 LAB — VITAMIN B12: Vitamin B-12: 290 pg/mL (ref 180–914)

## 2021-04-06 LAB — PROTEIN AND GLUCOSE, CSF
Glucose, CSF: 57 mg/dL (ref 40–70)
Total  Protein, CSF: 46 mg/dL — ABNORMAL HIGH (ref 15–45)

## 2021-04-06 LAB — BASIC METABOLIC PANEL
Anion gap: 6 (ref 5–15)
BUN: 9 mg/dL (ref 6–20)
CO2: 25 mmol/L (ref 22–32)
Calcium: 9 mg/dL (ref 8.9–10.3)
Chloride: 105 mmol/L (ref 98–111)
Creatinine, Ser: 0.68 mg/dL (ref 0.61–1.24)
GFR, Estimated: >60 mL/min (ref >60)
Glucose, Bld: 93 mg/dL (ref 70–99)
Potassium: 4.3 mmol/L (ref 3.5–5.1)
Sodium: 136 mmol/L (ref 135–145)

## 2021-04-06 LAB — TSH: TSH: 1.071 u[IU]/mL (ref 0.350–4.500)

## 2021-04-06 LAB — FOLATE: Folate: 13.2 ng/mL (ref >5.9)

## 2021-04-06 LAB — HIV ANTIBODY (ROUTINE TESTING W REFLEX): HIV Screen 4th Generation wRfx: NONREACTIVE

## 2021-04-06 MED ORDER — TRAMADOL HCL 50 MG PO TABS
50.0000 mg | ORAL_TABLET | Freq: Three times a day (TID) | ORAL | Status: DC | PRN
  Administered 2021-04-06 – 2021-04-10 (×2): 50 mg via ORAL
  Filled 2021-04-06 (×2): qty 1

## 2021-04-06 MED ORDER — SODIUM CHLORIDE 0.9% FLUSH
3.0000 mL | Freq: Two times a day (BID) | INTRAVENOUS | Status: DC
  Administered 2021-04-06 – 2021-04-11 (×10): 3 mL via INTRAVENOUS

## 2021-04-06 MED ORDER — LIDOCAINE HCL 2 % IJ SOLN
5.0000 mL | Freq: Once | INTRAMUSCULAR | Status: AC
  Administered 2021-04-06: 100 mg

## 2021-04-06 MED ORDER — ENOXAPARIN SODIUM 40 MG/0.4ML IJ SOSY
40.0000 mg | PREFILLED_SYRINGE | INTRAMUSCULAR | Status: DC
  Administered 2021-04-06 – 2021-04-10 (×5): 40 mg via SUBCUTANEOUS
  Filled 2021-04-06 (×6): qty 0.4

## 2021-04-06 MED ORDER — SENNOSIDES-DOCUSATE SODIUM 8.6-50 MG PO TABS: 1.0000 | ORAL_TABLET | Freq: Every evening | ORAL | Status: DC | PRN

## 2021-04-06 MED ORDER — SODIUM CHLORIDE 0.9 % IV SOLN: 250.0000 mL | INTRAVENOUS | Status: DC | PRN

## 2021-04-06 MED ORDER — SODIUM CHLORIDE 0.9% FLUSH
3.0000 mL | INTRAVENOUS | Status: DC | PRN
  Administered 2021-04-07: 3 mL via INTRAVENOUS

## 2021-04-06 MED ORDER — HYDROCODONE-ACETAMINOPHEN 5-325 MG PO TABS
1.0000 | ORAL_TABLET | ORAL | Status: DC | PRN
  Administered 2021-04-07 – 2021-04-08 (×6): 2 via ORAL
  Administered 2021-04-09: 1 via ORAL
  Administered 2021-04-09 – 2021-04-10 (×4): 2 via ORAL
  Administered 2021-04-11: 1 via ORAL
  Administered 2021-04-11: 2 via ORAL
  Filled 2021-04-06 (×2): qty 2
  Filled 2021-04-06: qty 1
  Filled 2021-04-06 (×3): qty 2
  Filled 2021-04-06: qty 1
  Filled 2021-04-06 (×7): qty 2

## 2021-04-06 MED ORDER — GADOBUTROL 1 MMOL/ML IV SOLN
6.0000 mL | Freq: Once | INTRAVENOUS | Status: AC | PRN
  Administered 2021-04-06: 6 mL via INTRAVENOUS

## 2021-04-06 MED ORDER — LIDOCAINE HCL 1 % IJ SOLN
5.0000 mL | Freq: Once | INTRAMUSCULAR | Status: DC
  Filled 2021-04-06: qty 5

## 2021-04-06 NOTE — Consult Note (Addendum)
NEUROLOGY CONSULTATION NOTE   Date of service: April 06, 2021 Patient Name: Connor Andrews MRN:  [id number] DOB:  [birth year] Reason for consult: "R hand weakness and numbness" Requesting Provider: Sabas Sous, MD _ _ _   _ __   _ __ _ _  __ __   _ __   __ _  History of Present Illness  Connor Andrews is a 50 y.o. male with PMH significant for HTN, arthritis, low back pain who presents with R arm weakness and numbness for the last 6 months.  Symptoms started with some tingling and numbness in R finger tips along with weakness. He also had some mid back pain along with this. Gradually progressed over the course of 6 months to tingling in his hand, then forearm and up his arm. He feels that over the last few days, this has progressed to tingling in his L finger tips.  He denies any fever, no meningismus, no rash, no hx of HIV, no high risk sexual activity over the last year and only has 1 partner. No oral or genital ulcers. No travel. No tick bites. No personal or famiyl history of autoimmune disease, no bladder issues. Eats a good mix of meat vegetables and dairy. No prior history of blood clots, no prior history of rash, no joint pain. No bladder or bowel issues at all.  He does endorse about 20-30lbs unintentional weight loss over the last year. He smokes, drinks 2-3 beers 2-3 times a week, no binge drinking, no recreational susbtances.  He reports that he saw someone for this earlier when it started and had a picture of his back taken and was told that he has a cyst. I do a lumbar MRI in care-everywhere with mention of a a small synovial cyst projecting in the neural foramen adjacent to the L5 nerveroot. I do not see any other imaging.   ROS   Constitutional Endorses weight loss, but not fever and chills.  HEENT Denies changes in vision and hearing.  Respiratory Denies SOB and cough.   CV Denies palpitations and CP   GI Denies abdominal pain, nausea, vomiting and diarrhea.   GU Denies  dysuria and urinary frequency.   MSK Denies myalgia and joint pain.   Skin Denies rash and pruritus.   Neurological Denies headache and syncope.   Psychiatric Denies recent changes in mood. Denies anxiety and depression.    Past History   Past Medical History:  Diagnosis Date   Arthritis    right hip   Hypertension    Low back pain    right side   History reviewed. No pertinent surgical history. Family History  Problem Relation Age of Onset   Hypertension Mother    Diabetes Mother    Hypertension Father    Social History   Socioeconomic History   Marital status: Married    Spouse name: Duwayne Heck   Number of children: 2   Years of education: Not on file   Highest education level: GED or equivalent  Occupational History    Comment: 07/22/20 out of work d/t symptoms  Tobacco Use   Smoking status: Every Day    Packs/day: 1.00    Pack years: 0.00    Types: Cigarettes   Smokeless tobacco: Never   Tobacco comments:    07/22/20 1 pk a week   Vaping Use   Vaping Use: Never used  Substance and Sexual Activity   Alcohol use: Yes    Comment: weekends only  Drug use: No   Sexual activity: Not on file  Other Topics Concern   Not on file  Social History Narrative   Lives with wife   Social Determinants of Health   Financial Resource Strain: Not on file  Food Insecurity: Not on file  Transportation Needs: Not on file  Physical Activity: Not on file  Stress: Not on file  Social Connections: Not on file   No Known Allergies  Medications  (Not in a hospital admission)    Vitals   Vitals:   04/06/21 1208 04/06/21 1645  BP: (!) 158/105 (!) 162/103  Pulse: 88 64  Resp: 18 17  Temp: 98.6 F (37 C)   TempSrc: Oral   SpO2: 97% 96%     There is no height or weight on file to calculate BMI.  Physical Exam   General: Laying comfortably in bed; in no acute distress.  HENT: Normal oropharynx and mucosa. Normal external appearance of ears and nose.  Neck:  Supple, no pain or tenderness  CV: No JVD. No peripheral edema.  Pulmonary: Symmetric Chest rise. Normal respiratory effort.  Abdomen: Soft to touch, non-tender.  Ext: No cyanosis, edema, or deformity  Skin: No rash. Normal palpation of skin.   Musculoskeletal: Normal digits and nails by inspection. No clubbing.   Neurologic Examination  Mental status/Cognition: Alert, oriented to self, place, month and year, good attention.  Speech/language: Fluent, comprehension intact, object naming intact, repetition intact.  Cranial nerves:   CN II Pupils equal and reactive to light, no VF deficits    CN III,IV,VI EOM intact, no gaze preference or deviation, no nystagmus    CN V normal sensation in V1, V2, and V3 segments bilaterally    CN VII no asymmetry, no nasolabial fold flattening    CN VIII normal hearing to speech   CN IX & X normal palatal elevation, no uvular deviation    CN XI 5/5 head turn and 5/5 shoulder shrug bilaterally    CN XII midline tongue protrusion    Motor:  Muscle bulk: normal, tone: normal, pronator drift none tremor none Mvmt Root Nerve  Muscle Right Left Comments  SA C5/6 Ax Deltoid 5 5   EF C5/6 Mc Biceps 5 5   EE C6/7/8 Rad Triceps 5 5   WF C6/7 Med FCR     WE C7/8 PIN ECU     F Ab C8/T1 U ADM/FDI 4+ 5   HF L1/2/3 Fem Illopsoas 5 5   KE L2/3/4 Fem Quad 5 5   DF L4/5 D Peron Tib Ant 5 5   PF S1/2 Tibial Grc/Sol 5 5    Reflexes:  Right Left Comments  Pectoralis      Biceps (C5/6) 2 2   Brachioradialis (C5/6) 2 2    Triceps (C6/7) 2 2    Patellar (L3/4) 3 3 Cross adductors +   Achilles (S1)      Hoffman      Plantar mute mute   Jaw jerk    Sensation:  Light touch Decreased in RUE   Pin prick Decreased in RUE   Temperature Decreased in RUE   Vibration Decreased in RUE  Proprioception    Coordination/Complex Motor:  - Finger to Nose intact BL - Heel to shin intact BL - Rapid alternating movement are slowed in RUE - Gait: Deferred.  Labs    CBC:  Recent Labs  Lab 04/06/21 1345  WBC 5.2  HGB 13.7  HCT 40.8  MCV  96.7  PLT 288    Basic Metabolic Panel:  Lab Results  Component Value Date   NA 136 04/06/2021   K 4.3 04/06/2021   CO2 25 04/06/2021   GLUCOSE 93 04/06/2021   BUN 9 04/06/2021   CREATININE 0.68 04/06/2021   CALCIUM 9.0 04/06/2021   GFRNONAA >60 04/06/2021   GFRAA 124 05/16/2020   Lipid Panel: No results found for: LDLCALC HgbA1c:  Lab Results  Component Value Date   HGBA1C 4.6 (L) 05/31/2019   Urine Drug Screen: No results found for: LABOPIA, COCAINSCRNUR, LABBENZ, AMPHETMU, THCU, LABBARB  Alcohol Level No results found for: [facility]  MR Brain without contrast(personally reviewed): 1. No evidence of acute intracranial abnormality. Specifically, no acute infarct. 2. Moderate T2/FLAIR hyperintensities within the white matter, which are age-advanced in number. This finding is nonspecific but can be seen in the setting of chronic microvascular ischemia, a demyelinating process such as multiple sclerosis, chronic migraines, or as the sequelae of a prior infectious or inflammatory process.  MRI C/T spine with and without contrast(personally reviewed): 1. Patchy increased T2 signal mostly within the dorsal cervical cord from C3 to C6-C7, but also involving the right and left hemicords at C5-C6. Some but not all of these areas demonstrate mild enhancement. Differential considerations include transverse myelitis, ADEM, and subacute combined degeneration. 2. Multilevel cervical spondylosis as described above. Severe neuroforaminal stenosis on the left at C3-C4 bilaterally at C4-C5.   Thoracic spine:   1. Normal MRI of the thoracic spine.  Impression   Connor Andrews is a 50 y.o. male with hx of HTN, arthritis, low back pain who presents with R arm weakness and numbness for the last 6 months. His neurologic examination is notable for decreased R hand grip along with sensory deficit in RUE to touch,  pinprick, temp and vibration. MRIs with patchy T2/FLAIR hyperintensity in dorsal cervical cord from C3-7. There is mild enhancement.  Differential for the noted patchy transverse myelitis includes potential viral causes including HIV, HSV 1 and 2, EBV, CMV, VZV, Enteroviruses, HTLV, Syphilis. Vs autoimmune including MS, NMO, Neurosarcoid, MOG Ab syndrome or secondary to systemic autoimmune disorders including SLE, Sarcoidosis, APLA, RA vs nutritional with B12 and potentially thiamine deficiency.  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  Recommendations  - Will get LP with CSF studies including Cell count with differential, cultures, protein, glucose, OCB, IgG Index, HSV 1 and 2, EBV PCR, CMV PCR, VDRL, CSF ACE. - RPR, HIV Ab, Mog Ab, NMO panel(AQP4), ANA IFA with reflex, DS DNA, RA, Vit B12, folate, thiamine. - Thiamine and B12 replacement along with multivitamin - Will get MRI Brain with contrast to evaluate for any enhancing lesions in the Brain. - Will need to discuss IV Solumedrol 1G daily x 5 days after the prelim CSF studies. ______________________________________________________________________   Thank you for the opportunity to take part in the care of this patient. If you have any further questions, please contact the neurology consultation attending.  Signed,  Erick Blinks [facility] Pager Number [phone] _ _ _   _ __   _ __ _ _  __ __   _ __   __ _

## 2021-04-06 NOTE — ED Notes (Signed)
Dinner tray ordered @ [phone]

## 2021-04-06 NOTE — ED Provider Notes (Signed)
[facility] Emergency Department Provider Note MRN:  [id number]  Arrival date & time: 04/06/21     Chief Complaint   Extremity Weakness   History of Present Illness   Connor Andrews is a 50 y.o. year-old male with a history of hypertension presenting to the ED with chief complaint of extremity weakness.  Worsening right arm numbness and weakness over the past 2 weeks.  Has been a mild problem over the past 6 to 8 months.  Has been told he has a cyst on his spine and takes prednisone.  Denies fever, no headache or vision change, no trouble with speech, no trouble swallowing, no chest pain or shortness of breath, no abdominal pain, no numbness or weakness to the legs.  Does endorse pain in the right arm which is constant, moderate, worse with motion or palpation.  Review of Systems  A complete 10 system review of systems was obtained and all systems are negative except as noted in the HPI and PMH.   Patient's Health History    Past Medical History:  Diagnosis Date   Arthritis    right hip   Hypertension    Low back pain    right side    History reviewed. No pertinent surgical history.  Family History  Problem Relation Age of Onset   Hypertension Mother    Diabetes Mother    Hypertension Father     Social History   Socioeconomic History   Marital status: Married    Spouse name: Duwayne Heck   Number of children: 2   Years of education: Not on file   Highest education level: GED or equivalent  Occupational History    Comment: 07/22/20 out of work d/t symptoms  Tobacco Use   Smoking status: Every Day    Packs/day: 1.00    Pack years: 0.00    Types: Cigarettes   Smokeless tobacco: Never   Tobacco comments:    07/22/20 1 pk a week   Vaping Use   Vaping Use: Never used  Substance and Sexual Activity   Alcohol use: Yes    Comment: weekends only   Drug use: No   Sexual activity: Not on file  Other Topics Concern   Not on file  Social History  Narrative   Lives with wife   Social Determinants of Health   Financial Resource Strain: Not on file  Food Insecurity: Not on file  Transportation Needs: Not on file  Physical Activity: Not on file  Stress: Not on file  Social Connections: Not on file  Intimate Partner Violence: Not on file     Physical Exam   Vitals:   04/06/21 1208  BP: (!) 158/105  Pulse: 88  Resp: 18  Temp: 98.6 F (37 C)  SpO2: 97%    CONSTITUTIONAL: Well-appearing, NAD NEURO:  Alert and oriented x 3, decreased sensation to right arm, decreased grip strength to right arm EYES:  eyes equal and reactive ENT/NECK:  no LAD, no JVD CARDIO: Regular rate, well-perfused, normal S1 and S2 PULM:  CTAB no wheezing or rhonchi GI/GU:  normal bowel sounds, non-distended, non-tender MSK/SPINE:  No gross deformities, no edema SKIN:  no rash, atraumatic PSYCH:  Appropriate speech and behavior  *Additional and/or pertinent findings included in MDM below  Diagnostic and Interventional Summary    EKG Interpretation  Date/Time:    Ventricular Rate:    PR Interval:    QRS Duration:   QT Interval:    QTC Calculation:  R Axis:     Text Interpretation:          Labs Reviewed  CBC  BASIC METABOLIC PANEL    MR BRAIN WO CONTRAST    (Results Pending)  MR CERVICAL SPINE W WO CONTRAST    (Results Pending)  MR THORACIC SPINE W WO CONTRAST    (Results Pending)    Medications - No data to display   Procedures  /  Critical Care Procedures  ED Course and Medical Decision Making  I have reviewed the triage vital signs, the nursing notes, and pertinent available records from the EMR.  Listed above are laboratory and imaging tests that I personally ordered, reviewed, and interpreted and then considered in my medical decision making (see below for details).  Progressive neurological deficit, considering myelopathy, stroke, will evaluate with MRI imaging.     Awaiting MRI imaging, signed out to oncoming  provider at shift change  Elmer Sow. Pilar Plate, MD [facility] Emergency Medicine [facility] [email or web address]  Final Clinical Impressions(s) / ED Diagnoses     ICD-10-CM   1. Upper extremity weakness  R29.898       ED Discharge Orders     None        Discharge Instructions Discussed with and Provided to Patient:   Discharge Instructions   None       Sabas Sous, MD 04/06/21 1515

## 2021-04-06 NOTE — H&P (Addendum)
History and Physical    Connor Andrews HER:[id number] DOB: [birth year] DOA: 04/06/2021  PCP: Arnette Felts, FNP Consultants:  none  Patient coming from:  Home - lives with wife and kid   Chief Complaint: right UE weakness and numbness   HPI: Connor Andrews is a 50 y.o. male with medical history significant of HTN, tobacco abuse who presented to ED with right upper extremity and hand weakness and numbness x 6 months that has been getting progressively worse. He is right handed. States that 6 months ago he started to have noticeable weakness in his right hand and the sensation felt "off." This has progressively gotten worse to the point he drops things in his right hand, can hardly write. Also has some weakness in his upper right extremity, but not as pronounced as his hand. Denies any weakness or sensation changes in his right lower leg. He denies excessive fatigue, vision changes, headaches. No chest pain, palpitations, abdominal pain, N/V/D.   No FH of MS/auto immune/neurodegenerative disease.    From chart he saw his pcp on 10/21 with complaints of new tingling and pain down his neck, right arm and right leg. Saw neurologist on 07/22/2020, but no follow up.    ED Course: vitals on arrival: bp: 158/105, HR: 88, RR: 18, afebrile and oxygen 97% on RA. Lab work unremarkable that was done in ER. MRI brain/cervical spine with possible demyelinating disease. Neurology consulted and we were asked to admit.    Review of Systems: As per HPI; otherwise review of systems reviewed and negative.   Ambulatory Status:  Ambulates without assistance  COVID Vaccine Status:  moderna x 2   Past Medical History:  Diagnosis Date   Arthritis    right hip   Hypertension    Low back pain    right side    History reviewed. No pertinent surgical history.  Social History   Socioeconomic History   Marital status: Married    Spouse name: Duwayne Heck   Number of children: 2   Years of education: Not on file    Highest education level: GED or equivalent  Occupational History    Comment: 07/22/20 out of work d/t symptoms  Tobacco Use   Smoking status: Every Day    Packs/day: 1.00    Pack years: 0.00    Types: Cigarettes   Smokeless tobacco: Never   Tobacco comments:    07/22/20 1 pk a week   Vaping Use   Vaping Use: Never used  Substance and Sexual Activity   Alcohol use: Yes    Comment: weekends only   Drug use: No   Sexual activity: Not on file  Other Topics Concern   Not on file  Social History Narrative   Lives with wife   Social Determinants of Health   Financial Resource Strain: Not on file  Food Insecurity: Not on file  Transportation Needs: Not on file  Physical Activity: Not on file  Stress: Not on file  Social Connections: Not on file  Intimate Partner Violence: Not on file    No Known Allergies  Family History  Problem Relation Age of Onset   Hypertension Mother    Diabetes Mother    Hypertension Father     Prior to Admission medications   Medication Sig Start Date End Date Taking? Authorizing Provider  amLODipine (NORVASC) 5 MG tablet Take 1 tablet (5 mg total) by mouth daily. 01/21/21 04/21/21  Wieters, Hallie C, PA-C  gabapentin (NEURONTIN) 300 MG capsule Take  1 capsule (300 mg total) by mouth 3 (three) times daily. 01/15/20 01/14/21  Arnette Felts, FNP  predniSONE (DELTASONE) 10 MG tablet Begin with 6 tabs on day 1, 5 tab on day 2, 4 tab on day 3, 3 tab on day 4, 2 tab on day 5, 1 tab on day 6-take with food 01/21/21   Wieters, Hallie C, PA-C  tiZANidine (ZANAFLEX) 4 MG tablet Take 0.5-1 tablets (2-4 mg total) by mouth every 6 (six) hours as needed for muscle spasms. 01/21/21   Wieters, Hallie C, PA-C  traMADol (ULTRAM) 50 MG tablet Take 1 tablet (50 mg total) by mouth every 6 (six) hours as needed. 05/16/20 05/16/21  Arnette Felts, FNP    Physical Exam: Vitals:   04/06/21 1208 04/06/21 1645  BP: (!) 158/105 (!) 162/103  Pulse: 88 64  Resp: 18 17  Temp: 98.6 F  (37 C)   TempSrc: Oral   SpO2: 97% 96%     General:  Appears calm and comfortable and is in NAD Eyes:  PERRL, EOMI, normal lids, iris ENT:  grossly normal hearing, lips & tongue, mmm; appropriate dentition Neck:  no LAD, masses or thyromegaly; no carotid bruits Cardiovascular:  RRR, no m/r/g. No LE edema.  Respiratory:   CTA bilaterally with no wheezes/rales/rhonchi.  Normal respiratory effort. Abdomen:  soft, NT, ND, NABS Back:   normal alignment, no CVAT Skin:  no rash or induration seen on limited exam Musculoskeletal:  Normal tone and strength of left UE and LE as well as right LE. His right UE has about 4/5 strength. His hand grip is significantly decreased and he can hardly grip my hand. no bony abnormality Lower extremity:  No LE edema.  Limited foot exam with no ulcerations.  2+ distal pulses. Psychiatric:  grossly normal mood and affect, speech fluent and appropriate, AOx3 Neurologic:  CN 2-12 grossly intact, proprioception intact bilaterally sensation intact, but dull on right upper extremity     Radiological Exams on Admission: Independently reviewed - see discussion in A/P where applicable  MR BRAIN WO CONTRAST  Result Date: 04/06/2021 CLINICAL DATA:  Neuro deficit, acute stroke suspected. EXAM: MRI HEAD WITHOUT CONTRAST TECHNIQUE: Multiplanar, multiecho pulse sequences of the brain and surrounding structures were obtained without intravenous contrast. COMPARISON:  CT head September 06, 2006. FINDINGS: Brain: No acute infarction, acute hemorrhage, hydrocephalus, extra-axial collection or mass lesion. Moderate scattered T2/FLAIR hyperintensities in the white matter, which are age advanced in number. Vascular: Major arterial flow voids are maintained at the skull base. Skull and upper cervical spine: Normal marrow signal. Sinuses/Orbits: Mild ethmoid air cell and inferior maxillary sinus mucosal thickening. Unremarkable orbits. Other: Trace bilateral mastoid fluid. IMPRESSION: 1.  No evidence of acute intracranial abnormality. Specifically, no acute infarct. 2. Moderate T2/FLAIR hyperintensities within the white matter, which are age-advanced in number. This finding is nonspecific but can be seen in the setting of chronic microvascular ischemia, a demyelinating process such as multiple sclerosis, chronic migraines, or as the sequelae of a prior infectious or inflammatory process. Electronically Signed   By: Feliberto Harts MD   On: 04/06/2021 16:40   MR CERVICAL SPINE W WO CONTRAST  Result Date: 04/06/2021 CLINICAL DATA:  Chronic right arm pain, numbness, and weakness, worse over the past 2 weeks. EXAM: MRI CERVICAL AND THORACIC SPINE WITHOUT AND WITH CONTRAST TECHNIQUE: Multiplanar and multiecho pulse sequences of the cervical spine, to include the craniocervical junction and cervicothoracic junction, and the thoracic spine, were obtained without and with intravenous  contrast. CONTRAST:  6mL GADAVIST GADOBUTROL 1 MMOL/ML IV SOLN COMPARISON:  Thoracic spine x-rays dated November 21, 2007. FINDINGS: MRI CERVICAL SPINE FINDINGS Alignment: Straightening of the normal cervical lordosis. No listhesis. Vertebrae: No fracture, evidence of discitis, or bone lesion. Scattered degenerative endplate marrow changes. Cord: There is patchy increased T2 signal mostly within the dorsal cord from C3 to C6-C7, but also involving the right and left hemicords at C5-C6. Some but not all of these areas demonstrate mild enhancement. Posterior Fossa, vertebral arteries, paraspinal tissues: Negative. Disc levels: C2-C3:  Negative. C3-C4: Negative disc. Left greater than right uncovertebral hypertrophy. Severe left and moderate right neuroforaminal stenosis. No spinal canal stenosis. C4-C5: Small circumferential disc osteophyte complex. Right greater than left uncovertebral hypertrophy. Mild spinal canal stenosis. Severe bilateral neuroforaminal stenosis. C5-C6: Mild disc bulging. Right uncovertebral hypertrophy.  Mild spinal canal stenosis. Mild to moderate right neuroforaminal stenosis. No left neuroforaminal stenosis. C6-C7: Mild disc bulging. Bilateral uncovertebral hypertrophy. Moderate bilateral neuroforaminal stenosis. No spinal canal stenosis. C7-T1: Mild disc bulging. Mild bilateral neuroforaminal stenosis. No spinal canal stenosis. MRI THORACIC SPINE FINDINGS Alignment:  Physiologic. Vertebrae: No fracture, evidence of discitis, or bone lesion. Cord:  Normal signal and morphology.  No intradural enhancement. Paraspinal and other soft tissues: Negative. Disc levels: Disc heights and hydration are preserved. No significant disc bulge or herniation. No stenosis. IMPRESSION: Cervical spine: 1. Patchy increased T2 signal mostly within the dorsal cervical cord from C3 to C6-C7, but also involving the right and left hemicords at C5-C6. Some but not all of these areas demonstrate mild enhancement. Differential considerations include transverse myelitis, ADEM, and subacute combined degeneration. 2. Multilevel cervical spondylosis as described above. Severe neuroforaminal stenosis on the left at C3-C4 bilaterally at C4-C5. Thoracic spine: 1. Normal MRI of the thoracic spine. Electronically Signed   By: Obie DredgeWilliam T Derry M.D.   On: 04/06/2021 16:45   MR THORACIC SPINE W WO CONTRAST  Result Date: 04/06/2021 CLINICAL DATA:  Chronic right arm pain, numbness, and weakness, worse over the past 2 weeks. EXAM: MRI CERVICAL AND THORACIC SPINE WITHOUT AND WITH CONTRAST TECHNIQUE: Multiplanar and multiecho pulse sequences of the cervical spine, to include the craniocervical junction and cervicothoracic junction, and the thoracic spine, were obtained without and with intravenous contrast. CONTRAST:  6mL GADAVIST GADOBUTROL 1 MMOL/ML IV SOLN COMPARISON:  Thoracic spine x-rays dated November 21, 2007. FINDINGS: MRI CERVICAL SPINE FINDINGS Alignment: Straightening of the normal cervical lordosis. No listhesis. Vertebrae: No fracture,  evidence of discitis, or bone lesion. Scattered degenerative endplate marrow changes. Cord: There is patchy increased T2 signal mostly within the dorsal cord from C3 to C6-C7, but also involving the right and left hemicords at C5-C6. Some but not all of these areas demonstrate mild enhancement. Posterior Fossa, vertebral arteries, paraspinal tissues: Negative. Disc levels: C2-C3:  Negative. C3-C4: Negative disc. Left greater than right uncovertebral hypertrophy. Severe left and moderate right neuroforaminal stenosis. No spinal canal stenosis. C4-C5: Small circumferential disc osteophyte complex. Right greater than left uncovertebral hypertrophy. Mild spinal canal stenosis. Severe bilateral neuroforaminal stenosis. C5-C6: Mild disc bulging. Right uncovertebral hypertrophy. Mild spinal canal stenosis. Mild to moderate right neuroforaminal stenosis. No left neuroforaminal stenosis. C6-C7: Mild disc bulging. Bilateral uncovertebral hypertrophy. Moderate bilateral neuroforaminal stenosis. No spinal canal stenosis. C7-T1: Mild disc bulging. Mild bilateral neuroforaminal stenosis. No spinal canal stenosis. MRI THORACIC SPINE FINDINGS Alignment:  Physiologic. Vertebrae: No fracture, evidence of discitis, or bone lesion. Cord:  Normal signal and morphology.  No intradural enhancement. Paraspinal and other  soft tissues: Negative. Disc levels: Disc heights and hydration are preserved. No significant disc bulge or herniation. No stenosis. IMPRESSION: Cervical spine: 1. Patchy increased T2 signal mostly within the dorsal cervical cord from C3 to C6-C7, but also involving the right and left hemicords at C5-C6. Some but not all of these areas demonstrate mild enhancement. Differential considerations include transverse myelitis, ADEM, and subacute combined degeneration. 2. Multilevel cervical spondylosis as described above. Severe neuroforaminal stenosis on the left at C3-C4 bilaterally at C4-C5. Thoracic spine: 1. Normal MRI of  the thoracic spine. Electronically Signed   By: Obie Dredge M.D.   On: 04/06/2021 16:45      Labs on Admission: I have personally reviewed the available labs and imaging studies at the time of the admission.  Pertinent labs:  Cbc/cmp: wnl.   Mri brain . Moderate T2/FLAIR hyperintensities within the white matter, which are age-advanced in number. This finding is nonspecific but can be seen in the setting of chronic microvascular ischemia, a demyelinating process such as multiple sclerosis, chronic migraines, or as the sequelae of a prior infectious or inflammatory process.  Cervical spine mri Patchy increased T2 signal mostly within the dorsal cervical cord from C3 to C6-C7, but also involving the right and left hemicords at C5-C6. Some but not all of these areas demonstrate mild enhancement. Differential considerations include transverse myelitis, ADEM, and subacute combined degeneration. Severe neuroforaminal stenosis on left at C3-C4 bilaterally at C4-C5    Assessment/Plan Principal Problem:   Right sided weakness Concern for demyelinating disease vs. Myelopathy. Neurology consulted and following -TSH/B12/ANA pending -follow recommendation/treatment per neuro   Active Problems:    HTN (hypertension) Elevated today and at past appointments.  Increase home norvasc to 10mg /day and may need second agent added    Tobacco abuse  States he has stopped smoking, declines nicotine patch.  Encouraged him for stopping.     There is no height or weight on file to calculate BMI.    Level of care: med-surg  DVT prophylaxis:  Lovenox  Code Status:  Full - confirmed with patient Family Communication: None present Disposition Plan:  The patient is from: home  Anticipated d/c is to: home without [facility] services once his neurological issue has been worked up and treated which may include IV steroids and more in hospital studies.  Consults called: neurology in ED: Dr. [facility]   Admission status:  inpatient    Derry Lory MD [facility]   How to contact the [facility] Attending or Consulting provider 7A - 7P or covering provider during after hours 7P -7A, for this patient?  Check the care team in [facility] and look for a) attending/consulting [facility] provider listed and b) the [facility] team listed Log into [email or web address] and use [place]'s universal password to access. If you do not have the password, please contact the hospital operator. Locate the [facility] provider you are looking for under [facility] and page to a number that you can be directly reached. If you still have difficulty reaching the provider, please page the [facility] (Director on Call) for the Hospitalists listed on amion for assistance.   04/06/2021, 5:42 PM

## 2021-04-06 NOTE — Procedures (Signed)
Indication:  Risks of the procedure were dicussed with the patient including post-LP headache, bleeding, infection, weakness/numbness of legs(radiculopathy).  The patient/patient's proxy agreed and written consent was obtained.   The patient was prepped and draped, and using sterile technique a 20 gauge quinke spinal needle was inserted in the L4-5 space. The opening pressure was not obtained. Approximately 16 cc of CSF were obtained and sent for analysis.  Erick Blinks [facility] Pager Number [phone]

## 2021-04-06 NOTE — ED Notes (Signed)
Neurology at bedside.

## 2021-04-06 NOTE — ED Provider Notes (Signed)
5:24 PM Care of the patient assumed at signout.  Patient continues to complain of weakness in his right upper extremity, numbness as well. I reviewed his MRIs, notable for white matter changes in multiple areas.  Following this review I discussed his case with our neurology colleagues, and they will follow as a consulting service.  Given concern for demyelination versus myelopathy versus other pathology, patient will require admission for ongoing monitoring, management.  COVID test pending, patient aware of need for admission.   Gerhard Munch, MD 04/06/21 1725

## 2021-04-06 NOTE — ED Triage Notes (Signed)
Pt is here for worsening of right arm weakness. Pt has had this weakness for 6 months and has been seen many times for the same. Pt has had an MRI which showed a cyst on his spine and he was placed on medications (steroids and anti inflamatory medications) but he is out of this medication.  Pt has been out of the medications for 4-5 days and has been having increasing weakness in right arm since.

## 2021-04-07 ENCOUNTER — Inpatient Hospital Stay (HOSPITAL_COMMUNITY): Payer: Self-pay

## 2021-04-07 DIAGNOSIS — R839 Unspecified abnormal finding in cerebrospinal fluid: Secondary | ICD-10-CM

## 2021-04-07 DIAGNOSIS — G373 Acute transverse myelitis in demyelinating disease of central nervous system: Principal | ICD-10-CM

## 2021-04-07 DIAGNOSIS — R634 Abnormal weight loss: Secondary | ICD-10-CM

## 2021-04-07 DIAGNOSIS — R531 Weakness: Secondary | ICD-10-CM

## 2021-04-07 LAB — SARS CORONAVIRUS 2 (TAT 6-24 HRS): SARS Coronavirus 2: NEGATIVE

## 2021-04-07 LAB — HEPATIC FUNCTION PANEL
ALT: 17 U/L (ref 0–44)
AST: 18 U/L (ref 15–41)
Albumin: 4 g/dL (ref 3.5–5.0)
Alkaline Phosphatase: 57 U/L (ref 38–126)
Bilirubin, Direct: 0.2 mg/dL (ref 0.0–0.2)
Indirect Bilirubin: 0.8 mg/dL (ref 0.3–0.9)
Total Bilirubin: 1 mg/dL (ref 0.3–1.2)
Total Protein: 6.5 g/dL (ref 6.5–8.1)

## 2021-04-07 LAB — HEPATITIS PANEL, ACUTE
HCV Ab: NONREACTIVE
Hep A IgM: NONREACTIVE
Hep B C IgM: NONREACTIVE
Hepatitis B Surface Ag: NONREACTIVE

## 2021-04-07 LAB — HIV ANTIBODY (ROUTINE TESTING W REFLEX): HIV Screen 4th Generation wRfx: NONREACTIVE

## 2021-04-07 LAB — RPR: RPR Ser Ql: NONREACTIVE

## 2021-04-07 MED ORDER — VITAMIN B-12 1000 MCG PO TABS
1000.0000 ug | ORAL_TABLET | Freq: Every day | ORAL | Status: DC
  Administered 2021-04-07 – 2021-04-11 (×5): 1000 ug via ORAL
  Filled 2021-04-07 (×5): qty 1

## 2021-04-07 MED ORDER — AMLODIPINE BESYLATE 10 MG PO TABS
10.0000 mg | ORAL_TABLET | Freq: Every day | ORAL | Status: DC
  Administered 2021-04-07 – 2021-04-11 (×5): 10 mg via ORAL
  Filled 2021-04-07 (×6): qty 1

## 2021-04-07 MED ORDER — SODIUM CHLORIDE 0.9 % IV SOLN
1000.0000 mg | Freq: Every day | INTRAVENOUS | Status: AC
  Administered 2021-04-07 – 2021-04-11 (×5): 1000 mg via INTRAVENOUS
  Filled 2021-04-07 (×5): qty 8

## 2021-04-07 MED ORDER — VANCOMYCIN HCL 1000 MG/200ML IV SOLN
1000.0000 mg | INTRAVENOUS | Status: AC
  Administered 2021-04-07: 1000 mg via INTRAVENOUS
  Filled 2021-04-07: qty 200

## 2021-04-07 MED ORDER — LOSARTAN POTASSIUM 25 MG PO TABS
25.0000 mg | ORAL_TABLET | Freq: Every day | ORAL | Status: DC
  Administered 2021-04-07 – 2021-04-11 (×5): 25 mg via ORAL
  Filled 2021-04-07 (×5): qty 1

## 2021-04-07 MED ORDER — GADOBUTROL 1 MMOL/ML IV SOLN: 6.0000 mL | Freq: Once | INTRAVENOUS | Status: DC | PRN

## 2021-04-07 MED ORDER — THIAMINE HCL 100 MG PO TABS
100.0000 mg | ORAL_TABLET | Freq: Every day | ORAL | Status: DC
  Administered 2021-04-07 – 2021-04-11 (×5): 100 mg via ORAL
  Filled 2021-04-07 (×5): qty 1

## 2021-04-07 MED ORDER — AMLODIPINE BESYLATE 5 MG PO TABS
5.0000 mg | ORAL_TABLET | Freq: Once | ORAL | Status: AC
  Administered 2021-04-07: 5 mg via ORAL
  Filled 2021-04-07: qty 1

## 2021-04-07 MED ORDER — GABAPENTIN 300 MG PO CAPS
300.0000 mg | ORAL_CAPSULE | Freq: Three times a day (TID) | ORAL | Status: DC
  Administered 2021-04-07 – 2021-04-11 (×13): 300 mg via ORAL
  Filled 2021-04-07 (×13): qty 1

## 2021-04-07 MED ORDER — VANCOMYCIN HCL 750 MG/150ML IV SOLN: 750.0000 mg | Freq: Two times a day (BID) | INTRAVENOUS | Status: DC

## 2021-04-07 MED ORDER — VANCOMYCIN HCL 500 MG/100ML IV SOLN
500.0000 mg | Freq: Three times a day (TID) | INTRAVENOUS | Status: DC
  Administered 2021-04-07 – 2021-04-08 (×3): 500 mg via INTRAVENOUS
  Filled 2021-04-07 (×4): qty 100

## 2021-04-07 NOTE — Progress Notes (Signed)
[facility] night shift MedSurg coverage note.  The nursing staff reports that the patient has had persistent hypertensive numbers since admission.  His amlodipine was increased to 10 mg p.o. daily, but he only have 5 mg yesterday.  Amlodipine 5 mg p.o. x1 dose ordered.  Sanda Klein, MD.

## 2021-04-07 NOTE — Progress Notes (Addendum)
Neurology Progress Note   S:// Patient seen and examined. Reports no changes. Reports mild discomfort in the back where the needle was placed for the spinal tap. Denies any bowel bladder incontinence.  Denies any illicit drug use.  Denies any gait difficulties or lower extremity weakness.   O:// Current vital signs: BP (!) 147/102 (BP Location: Left Arm)   Pulse 80   Temp 97.9 F (36.6 C) (Oral)   Resp 16   Ht 5\' 10"  (1.778 m)   Wt 55.7 kg   SpO2 98%   BMI 17.63 kg/m  Vital signs in last 24 hours: Temp:  [97.8 F (36.6 C)-98.6 F (37 C)] 97.9 F (36.6 C) (06/27 0246) Pulse Rate:  [59-88] 80 (06/27 0246) Resp:  [16-18] 16 (06/27 0246) BP: (147-164)/(98-107) 147/102 (06/27 0246) SpO2:  [96 %-99 %] 98 % (06/27 0246) Weight:  [55.7 kg] 55.7 kg (06/26 2049) GENERAL: Awake, alert in NAD HEENT: - Normocephalic and atraumatic, dry mm, no LN++, no Thyromegally LUNGS - Clear to auscultation bilaterally with no wheezes CV - S1S2 RRR, no m/r/g, equal pulses bilaterally. ABDOMEN - Soft, nontender, nondistended with normoactive BS Ext: warm, well perfused, intact peripheral pulses, no edema NEURO:  Mental Status: AA&Ox3  Language: Speech not dysarthric.  No aphasia. Cranial Nerves: PERRL . EOMI, visual fields full, no facial asymmetry, facial sensation intact, hearing intact, tongue/uvula/soft palate midline, normal sternocleidomastoid and trapezius muscle strength. No evidence of tongue atrophy or fibrillations Motor: Minimal grip strength weakness on the right, otherwise full strength right upper extremity.  Left upper extremity 5/5.  Right lower extremity 5/5.  Left lower extremity 5/5.2050 Tone: is normal and bulk is normal Sensation-diminished to touch on right hemibody. Coordination: FTN intact bilaterally, no ataxia in BLE. Gait- deferred DTRs: 2+ in bilateral upper extremities and mildly brisk in the patellar's bilaterally with mute plantars.  Medications  Current  Facility-Administered Medications:    0.9 %  sodium chloride infusion, 250 mL, Intravenous, PRN, Marland Kitchen, MD   amLODipine (NORVASC) tablet 10 mg, 10 mg, Oral, Daily, Orland Mustard, MD, 10 mg at 04/07/21 0839   enoxaparin (LOVENOX) injection 40 mg, 40 mg, Subcutaneous, Q24H, 04/09/21, MD, 40 mg at 04/06/21 2119   gabapentin (NEURONTIN) capsule 300 mg, 300 mg, Oral, TID, 2120, MD, 300 mg at 04/07/21 0836   gadobutrol (GADAVIST) 1 MMOL/ML injection 6 mL, 6 mL, Intravenous, Once PRN, 04/09/21, MD   HYDROcodone-acetaminophen (NORCO/VICODIN) 5-325 MG per tablet 1-2 tablet, 1-2 tablet, Oral, Q4H PRN, Erick Blinks, MD, 2 tablet at 04/07/21 0836   losartan (COZAAR) tablet 25 mg, 25 mg, Oral, Daily, Anwar, Shayan S, DO   senna-docusate (Senokot-S) tablet 1 tablet, 1 tablet, Oral, QHS PRN, 05-22-2006, MD   sodium chloride flush (NS) 0.9 % injection 3 mL, 3 mL, Intravenous, Q12H, Orland Mustard, MD, 3 mL at 04/07/21 0947   sodium chloride flush (NS) 0.9 % injection 3 mL, 3 mL, Intravenous, PRN, 04/09/21, MD   traMADol Orland Mustard) tablet 50 mg, 50 mg, Oral, Q8H PRN, Janean Sark, MD, 50 mg at 04/06/21 2117   vancomycin (VANCOREADY) IVPB 500 mg/100 mL, 500 mg, Intravenous, 2118, [facility]  Labs CBC    Component Value Date/Time   WBC 5.2 04/06/2021 1345   RBC 4.22 04/06/2021 1345   HGB 13.7 04/06/2021 1345   HGB 13.9 05/16/2020 1725   HCT 40.8 04/06/2021 1345   HCT 41.1 05/16/2020 1725   PLT 288 04/06/2021 1345   PLT  287 05/16/2020 1725   MCV 96.7 04/06/2021 1345   MCV 95 05/16/2020 1725   MCH 32.5 04/06/2021 1345   MCHC 33.6 04/06/2021 1345   RDW 12.5 04/06/2021 1345   RDW 12.0 05/16/2020 1725   LYMPHSABS 1.8 05/31/2019 1521   MONOABS 0.4 02/11/2016 1005   EOSABS 0.1 05/31/2019 1521   BASOSABS 0.1 05/31/2019 1521    CMP     Component Value Date/Time   NA 136 04/06/2021 1345   NA 138 05/16/2020 1725   K 4.3 04/06/2021 1345   CL 105  04/06/2021 1345   CO2 25 04/06/2021 1345   GLUCOSE 93 04/06/2021 1345   BUN 9 04/06/2021 1345   BUN 11 05/16/2020 1725   CREATININE 0.68 04/06/2021 1345   CALCIUM 9.0 04/06/2021 1345   PROT 7.0 05/16/2020 1725   ALBUMIN 5.1 (H) 05/16/2020 1725   AST 18 05/16/2020 1725   ALT 15 05/16/2020 1725   ALKPHOS 71 05/16/2020 1725   BILITOT 0.6 05/16/2020 1725   GFRNONAA >60 04/06/2021 1345   GFRAA 124 05/16/2020 1725   B12 290 CSF analysis 2 WBCs, total protein 46, glucose 57 CSF gram stain: Gram-positive cocci in chains  Imaging I have reviewed images in epic and the results pertinent to this consultation are: MRI brain with and without contrast with very nonspecific looking punctate T2 hyperintensities scattered in bilateral hemispheres not in a pattern suggestive of demyelination. MRI of the cervical spine: Patchy increased T2 signal mostly in the dorsal cervical cord from C3-C6 C7 but also involving the right and left hemicord at C5-C6.  Some of these areas demonstrate mild enhancement.  Differentials: Transverse myelitis/ADEM/subacute combined degeneration.  Discussed with neuroradiology.  Further differentials could include autoimmune conditions, anti-MO G myelitis as well as neuromyelitis optica and neurosarcoidosis.  Assessment: 50 year old man with past medical history of hypertension, arthritis and low back pain presenting with right arm weakness and numbness for the last 6 to 8 months.  Neurological examination with decreased right hand grip strength along with right hemibody sensory diminishment in all modalities.  MRI with patchy T2 flair hyperintensity in the dorsal cervical cord from C3-C7 with mild enhancement.   CSF analysis shows 2 WBCs, total protein 46, glucose 57. Surprisingly the microbiology on the CSF is showing gram-positive cocci in chains for which she was started on vancomycin overnight. Based on his clinical course of 6 to 8 months-I would not of expected an acute  infectious process but his CSF does seem to grow gram-positive cocci in chains.  We will wait for further characterization to make sure there is not a contaminant. Differentials in this case are broad including demyelination, autoimmune as well as rare causes such as neurosarcoidosis.  Pattern of imaging and course is not suggestive of spinal cord ischemia.  Before I recommend committing him to high-dose steroids, I want to to make sure that infectious causes is ruled out.  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 Wait for the cultures further before initiating high-dose steroid treatment. Continue B12 and thiamine replacement Will follow   -- Milon Dikes, MD Neurologist [facility] Pager: [phone]

## 2021-04-07 NOTE — Progress Notes (Signed)
PROGRESS NOTE    Connor Andrews  MWN:[id number] DOB: 1971-05-16 DOA: 04/06/2021 PCP: Arnette Felts, FNP   Brief Narrative: 50 year old African-American male with a past medical history of hypertension, tobacco dependence.  The patient presented to the emergency department with right upper extremity and weakness and numbness x6 months that has progressively been getting worse.  He also endorses weight loss of about 20 to 30 pounds over the last 6 months to 1 year   Subjective: No acute events reported overnight per nursing staff.  Patient seen and examined at bedside this morning.  Still having right upper extremity weakness and numbness and tingling.  No fevers or chills.  No chest pain.   Assessment & Plan:   Principal Problem:   Right sided weakness Active Problems:   HTN (hypertension)   Tobacco abuse   CSF abnormal   Weight loss   Right hand numbness/right arm weakness -Unclear etiology at the moment.  Differential includes transverse myelitis versus autoimmune versus infectious -Neurology has been consulted.  They have proceeded to get a lumbar puncture.  Imaging shows some patchy changes looking like transverse myelitis but not very consistent with a demyelinating process such as MS or NMO but the CSF is also growing GPC's -His disease has been consulted and they think this is a contaminant and therefore we will try to reculture. -Empirically started on IV vancomycin -If confirmed the GPC is a contaminant in the CSF studies the patient will be started on high-dose steroids per neurology.  Unintentional weight loss -Clear etiology.  Will obtain hepatitis panel, HIV, TB, fecal occult blood test but he is not anemic.  TSH studies were within normal limits.  HTN -Controlled.  Continue Adipine 10 mg daily.  Start Cozaar 25 mg daily.  Tobacco dependence -Declined nicotine patch.  Counseled on smoking cessation.   DVT prophylaxis: Lovenox Code Status: Full Family  Communication: No family present at bedside Disposition:   Status is: Inpatient  Remains inpatient appropriate because:Ongoing diagnostic testing needed not appropriate for outpatient work up  Dispo: The patient is from: Home              Anticipated d/c is to: Home              Patient currently is not medically stable to d/c.   Difficult to place patient No        Consultants:  Infectious disease, neurology     Objective: Vitals:   04/06/21 2049 04/06/21 2330 04/07/21 0246 04/07/21 1209  BP: (!) 164/104 (!) 148/107 (!) 147/102 108/85  Pulse: 68 77 80 67  Resp: 18 18 16 16   Temp: 97.8 F (36.6 C)  97.9 F (36.6 C) 98.6 F (37 C)  TempSrc: Oral  Oral Oral  SpO2: 99% 98% 98% 98%  Weight: 55.7 kg     Height: 5\' 10"  (1.778 m)       Intake/Output Summary (Last 24 hours) at 04/07/2021 1343 Last data filed at 04/07/2021 0121 Gross per 24 hour  Intake 480 ml  Output --  Net 480 ml   Filed Weights   04/06/21 2049  Weight: 55.7 kg    Examination:  General:  Appears calm and comfortable and is in NAD Eyes:  PERRL, EOMI, normal lids, iris ENT:  grossly normal hearing, lips & tongue, mmm; appropriate dentition Neck:  no LAD, masses or thyromegaly; no carotid bruits Cardiovascular:  RRR, no m/r/g. No LE edema. Respiratory:   CTA bilaterally with no wheezes/rales/rhonchi.  Normal respiratory  effort. Abdomen:  soft, NT, ND, NABS Back:   normal alignment, no CVAT Skin:  no rash or induration seen on limited exam Musculoskeletal:  Normal tone and strength of left UE and LE as well as right LE. His right UE has about 4/5 strength. His hand grip is significantly decreased and he can hardly grip my hand. no bony abnormality Lower extremity:  No LE edema.  Limited foot exam with no ulcerations.  2+ distal pulses. Psychiatric:  grossly normal mood and affect, speech fluent and appropriate, AOx3 Neurologic:  CN 2-12 grossly intact, proprioception intact bilaterally sensation  intact, but dull on right upper extremity     Data Reviewed: I have personally reviewed following labs and imaging studies  CBC: Recent Labs  Lab 04/06/21 1345  WBC 5.2  HGB 13.7  HCT 40.8  MCV 96.7  PLT 288    Basic Metabolic Panel: Recent Labs  Lab 04/06/21 1345  NA 136  K 4.3  CL 105  CO2 25  GLUCOSE 93  BUN 9  CREATININE 0.68  CALCIUM 9.0    GFR: Estimated Creatinine Clearance: 86.1 mL/min (by C-G formula based on SCr of 0.68 mg/dL).  Liver Function Tests: Recent Labs  Lab 04/07/21 1111  AST 18  ALT 17  ALKPHOS 57  BILITOT 1.0  PROT 6.5  ALBUMIN 4.0    CBG: No results for input(s): GLUCAP in the last 168 hours.   Recent Results (from the past 240 hour(s))  SARS CORONAVIRUS 2 (TAT 6-24 HRS) Nasopharyngeal Nasopharyngeal Swab     Status: None   Collection Time: 04/06/21  7:02 PM   Specimen: Nasopharyngeal Swab  Result Value Ref Range Status   SARS Coronavirus 2 NEGATIVE NEGATIVE Final    Comment: (NOTE) SARS-CoV-2 target nucleic acids are NOT DETECTED.  The SARS-CoV-2 RNA is generally detectable in upper and lower respiratory specimens during the acute phase of infection. Negative results do not preclude SARS-CoV-2 infection, do not rule out co-infections with other pathogens, and should not be used as the sole basis for treatment or other patient management decisions. Negative results must be combined with clinical observations, patient history, and epidemiological information. The expected result is Negative.  Fact Sheet for Patients: HairSlick.nohttps://www.fda.gov/media/138098/download  Fact Sheet for Healthcare Providers: quierodirigir.comhttps://www.fda.gov/media/138095/download  This test is not yet approved or cleared by the [place] FDA and  has been authorized for detection and/or diagnosis of SARS-CoV-2 by FDA under an Emergency Use Authorization (EUA). This EUA will remain  in effect (meaning this test can be used) for the duration of the COVID-19  declaration under Se ction 564(b)(1) of the Act, 21 U.S.C. section 360bbb-3(b)(1), unless the authorization is terminated or revoked sooner.  Performed at [facility], [address] [place], [place] [address]   CSF culture w Stat Gram Stain     Status: None (Preliminary result)   Collection Time: 04/06/21  8:32 PM   Specimen: CSF; Cerebrospinal Fluid  Result Value Ref Range Status   Specimen Description CSF  Final   Special Requests NONE  Final   Gram Stain   Final    WBC PRESENT, PREDOMINANTLY MONONUCLEAR GRAM POSITIVE COCCI IN CHAINS CRITICAL RESULT CALLED TO, READ BACK BY AND VERIFIED WITH: ELKE K. BY MESSAN H. AT 2210 ON 6 26 2022 CYTOSPIN SMEAR Performed at [facility], [address] [place], [place] [address]    Culture PENDING  Incomplete   Report Status PENDING  Incomplete  Radiology Studies: MR BRAIN WO CONTRAST  Result Date: 04/06/2021 CLINICAL DATA:  Neuro deficit, acute stroke suspected. EXAM: MRI HEAD WITHOUT CONTRAST TECHNIQUE: Multiplanar, multiecho pulse sequences of the brain and surrounding structures were obtained without intravenous contrast. COMPARISON:  CT head September 06, 2006. FINDINGS: Brain: No acute infarction, acute hemorrhage, hydrocephalus, extra-axial collection or mass lesion. Moderate scattered T2/FLAIR hyperintensities in the white matter, which are age advanced in number. Vascular: Major arterial flow voids are maintained at the skull base. Skull and upper cervical spine: Normal marrow signal. Sinuses/Orbits: Mild ethmoid air cell and inferior maxillary sinus mucosal thickening. Unremarkable orbits. Other: Trace bilateral mastoid fluid. IMPRESSION: 1. No evidence of acute intracranial abnormality. Specifically, no acute infarct. 2. Moderate T2/FLAIR hyperintensities within the white matter, which are age-advanced in number. This finding is nonspecific but can be seen in the setting of chronic microvascular ischemia, a  demyelinating process such as multiple sclerosis, chronic migraines, or as the sequelae of a prior infectious or inflammatory process. Electronically Signed   By: Feliberto Harts MD   On: 04/06/2021 16:40   MR BRAIN W CONTRAST  Result Date: 04/07/2021 CLINICAL DATA:  Brain mass or lesion. EXAM: MRI HEAD WITH CONTRAST TECHNIQUE: Multiplanar, multiecho pulse sequences of the brain and surrounding structures were obtained with intravenous contrast. CONTRAST:  22mL GADAVIST GADOBUTROL 1 MMOL/ML IV SOLN COMPARISON:  None. FINDINGS: Brain: No abnormal intracranial enhancement Vascular: Normal vascular enhancements. Skull and upper cervical spine: No evidence of bone lesion IMPRESSION: No abnormal enhancement or mass. Electronically Signed   By: Marnee Spring M.D.   On: 04/07/2021 05:42   MR CERVICAL SPINE W WO CONTRAST  Result Date: 04/06/2021 CLINICAL DATA:  Chronic right arm pain, numbness, and weakness, worse over the past 2 weeks. EXAM: MRI CERVICAL AND THORACIC SPINE WITHOUT AND WITH CONTRAST TECHNIQUE: Multiplanar and multiecho pulse sequences of the cervical spine, to include the craniocervical junction and cervicothoracic junction, and the thoracic spine, were obtained without and with intravenous contrast. CONTRAST:  51mL GADAVIST GADOBUTROL 1 MMOL/ML IV SOLN COMPARISON:  Thoracic spine x-rays dated November 21, 2007. FINDINGS: MRI CERVICAL SPINE FINDINGS Alignment: Straightening of the normal cervical lordosis. No listhesis. Vertebrae: No fracture, evidence of discitis, or bone lesion. Scattered degenerative endplate marrow changes. Cord: There is patchy increased T2 signal mostly within the dorsal cord from C3 to C6-C7, but also involving the right and left hemicords at C5-C6. Some but not all of these areas demonstrate mild enhancement. Posterior Fossa, vertebral arteries, paraspinal tissues: Negative. Disc levels: C2-C3:  Negative. C3-C4: Negative disc. Left greater than right uncovertebral  hypertrophy. Severe left and moderate right neuroforaminal stenosis. No spinal canal stenosis. C4-C5: Small circumferential disc osteophyte complex. Right greater than left uncovertebral hypertrophy. Mild spinal canal stenosis. Severe bilateral neuroforaminal stenosis. C5-C6: Mild disc bulging. Right uncovertebral hypertrophy. Mild spinal canal stenosis. Mild to moderate right neuroforaminal stenosis. No left neuroforaminal stenosis. C6-C7: Mild disc bulging. Bilateral uncovertebral hypertrophy. Moderate bilateral neuroforaminal stenosis. No spinal canal stenosis. C7-T1: Mild disc bulging. Mild bilateral neuroforaminal stenosis. No spinal canal stenosis. MRI THORACIC SPINE FINDINGS Alignment:  Physiologic. Vertebrae: No fracture, evidence of discitis, or bone lesion. Cord:  Normal signal and morphology.  No intradural enhancement. Paraspinal and other soft tissues: Negative. Disc levels: Disc heights and hydration are preserved. No significant disc bulge or herniation. No stenosis. IMPRESSION: Cervical spine: 1. Patchy increased T2 signal mostly within the dorsal cervical cord from C3 to C6-C7, but also involving the right and left hemicords at  C5-C6. Some but not all of these areas demonstrate mild enhancement. Differential considerations include transverse myelitis, ADEM, and subacute combined degeneration. 2. Multilevel cervical spondylosis as described above. Severe neuroforaminal stenosis on the left at C3-C4 bilaterally at C4-C5. Thoracic spine: 1. Normal MRI of the thoracic spine. Electronically Signed   By: Obie Dredge M.D.   On: 04/06/2021 16:45   MR THORACIC SPINE W WO CONTRAST  Result Date: 04/06/2021 CLINICAL DATA:  Chronic right arm pain, numbness, and weakness, worse over the past 2 weeks. EXAM: MRI CERVICAL AND THORACIC SPINE WITHOUT AND WITH CONTRAST TECHNIQUE: Multiplanar and multiecho pulse sequences of the cervical spine, to include the craniocervical junction and cervicothoracic junction,  and the thoracic spine, were obtained without and with intravenous contrast. CONTRAST:  2mL GADAVIST GADOBUTROL 1 MMOL/ML IV SOLN COMPARISON:  Thoracic spine x-rays dated November 21, 2007. FINDINGS: MRI CERVICAL SPINE FINDINGS Alignment: Straightening of the normal cervical lordosis. No listhesis. Vertebrae: No fracture, evidence of discitis, or bone lesion. Scattered degenerative endplate marrow changes. Cord: There is patchy increased T2 signal mostly within the dorsal cord from C3 to C6-C7, but also involving the right and left hemicords at C5-C6. Some but not all of these areas demonstrate mild enhancement. Posterior Fossa, vertebral arteries, paraspinal tissues: Negative. Disc levels: C2-C3:  Negative. C3-C4: Negative disc. Left greater than right uncovertebral hypertrophy. Severe left and moderate right neuroforaminal stenosis. No spinal canal stenosis. C4-C5: Small circumferential disc osteophyte complex. Right greater than left uncovertebral hypertrophy. Mild spinal canal stenosis. Severe bilateral neuroforaminal stenosis. C5-C6: Mild disc bulging. Right uncovertebral hypertrophy. Mild spinal canal stenosis. Mild to moderate right neuroforaminal stenosis. No left neuroforaminal stenosis. C6-C7: Mild disc bulging. Bilateral uncovertebral hypertrophy. Moderate bilateral neuroforaminal stenosis. No spinal canal stenosis. C7-T1: Mild disc bulging. Mild bilateral neuroforaminal stenosis. No spinal canal stenosis. MRI THORACIC SPINE FINDINGS Alignment:  Physiologic. Vertebrae: No fracture, evidence of discitis, or bone lesion. Cord:  Normal signal and morphology.  No intradural enhancement. Paraspinal and other soft tissues: Negative. Disc levels: Disc heights and hydration are preserved. No significant disc bulge or herniation. No stenosis. IMPRESSION: Cervical spine: 1. Patchy increased T2 signal mostly within the dorsal cervical cord from C3 to C6-C7, but also involving the right and left hemicords at C5-C6.  Some but not all of these areas demonstrate mild enhancement. Differential considerations include transverse myelitis, ADEM, and subacute combined degeneration. 2. Multilevel cervical spondylosis as described above. Severe neuroforaminal stenosis on the left at C3-C4 bilaterally at C4-C5. Thoracic spine: 1. Normal MRI of the thoracic spine. Electronically Signed   By: Obie Dredge M.D.   On: 04/06/2021 16:45        Scheduled Meds:  amLODipine  10 mg Oral Daily   enoxaparin (LOVENOX) injection  40 mg Subcutaneous Q24H   gabapentin  300 mg Oral TID   losartan  25 mg Oral Daily   sodium chloride flush  3 mL Intravenous Q12H   Continuous Infusions:  sodium chloride     vancomycin 500 mg (04/07/21 1139)     LOS: 1 day    Time spent: 35 minutes    Verdia Kuba, MD [facility]   To contact the attending provider between 7A-7P or the covering provider during after hours 7P-7A, please log into the web site [email or web address] and access using universal [place] password for that web site. If you do not have the password, please call the hospital operator.  04/07/2021, 1:43 PM

## 2021-04-07 NOTE — Consult Note (Signed)
[facility]    Date of Admission:  04/06/2021     Reason for Consult: abnormal csf finding    Referring Provider: Linna Darner    Lines:  peripheral  Abx: 6/27-c vanc        Assessment: 50 y.o. male htn/tobacco use admitted for 1 week acute on chronic RUE pain/weakness, with csf analysis gpc in pairs on stain which id consulted  I discussed case with labs. Based on clinical history/objective, this csf finding appears to be a contaminant. I have asked labs to re-setup another culture from left over csf as well  Can continue vanc pending workup.   With regard to his weight loss. No obvious ID cause. Can send for TB screen which would be needed anyway in setting of likely chemotherapy/immunosuppression. Whipple could be a chronic presentation with b sx but he doesn't really meet criteria clinically. Lyme would not present like this and he has no pretest risk factor   Plan: Continue vanc for now Will follow csf analysis with micro No contraindication if urgent immunosuppression needed, from id standpoint  Discussed with team  I spent 60 minute reviewing data/chart, and coordinating care and >50% direct face to face time providing counseling/discussing diagnostics/treatment plan with patient      ------------------------------------------------ Principal Problem:   Right sided weakness Active Problems:   HTN (hypertension)   Tobacco abuse    HPI: Connor Andrews is a 50 y.o. male htn/tobacco use admitted for 1 week acute on chronic RUE pain/weakness, with csf analysis gpc in pairs on stain which id consulted   Patient at baseline health until 6-8 months ago when noted gradual onset/progressive rue pain/weakness. He never seeked care for this. The last week or so the pain is worse. He is dropping things when holding. Due to progression/severity of sx came for admission  Pain stopped at the shoulder. No neck pain. No headache. No other  neurological deficit. No visual change/hearing loss  He also reports weight loss the last 2-3 months. Said his appetite is intact and he enjoys his food still. He doesn't have diabetes. His plasma glucose level in the 90s here  He endorse non-drenching hot flush/sweat at night for the past few months as well. No cough, chest pain, rash, n/v/abd pain, diarrhea, joint pain, back pain  No heat/cold intolerance, frequent stool/constipation, easy bruising bleeding, facial flushing/wheezing  W/u here so far with neurology showed c3-7 patchy t2 hyperintensity. A lumbar puncture was done and gram stain showed gpc in pairs. The cell count/glucose-protein were normal.   Vanc was started. Id asked to comment  There is plan to start immunosuppression as concern of autoimmune process in this gentleman causing the mri finding and arm weakness  Born/raised in [place], [place] cups No street drugs No pets/animals No unpasteurized dairy No exotic hobbies or outdoor hobbies  Family History  Problem Relation Age of Onset   Hypertension Mother    Diabetes Mother    Hypertension Father     Social History   Tobacco Use   Smoking status: Every Day    Packs/day: 1.00    Pack years: 0.00    Types: Cigarettes   Smokeless tobacco: Never   Tobacco comments:    07/22/20 1 pk a week   Vaping Use   Vaping Use: Never used  Substance Use Topics   Alcohol use: Yes    Comment: weekends only   Drug use: No  No Known Allergies  Review of Systems: ROS All Other ROS was negative, except mentioned above   Past Medical History:  Diagnosis Date   Arthritis    right hip   Hypertension    Low back pain    right side       Scheduled Meds:  amLODipine  10 mg Oral Daily   enoxaparin (LOVENOX) injection  40 mg Subcutaneous Q24H   gabapentin  300 mg Oral TID   losartan  25 mg Oral Daily   sodium chloride flush  3 mL Intravenous Q12H   Continuous Infusions:  sodium chloride      vancomycin 500 mg (04/07/21 1139)   PRN Meds:.sodium chloride, gadobutrol, HYDROcodone-acetaminophen, senna-docusate, sodium chloride flush, traMADol   OBJECTIVE: Blood pressure 108/85, pulse 67, temperature 98.6 F (37 C), temperature source Oral, resp. rate 16, height 5\' 10"  (1.778 m), weight 55.7 kg, SpO2 98 %.  Physical Exam General/constitutional: no distress, pleasant HEENT: Normocephalic, PER, Conj Clear, EOMI, Oropharynx clear Neck supple CV: rrr no mrg Lungs: clear to auscultation, normal respiratory effort Abd: Soft, Nontender Ext: no edema Skin: No Rash Neuro: right UE weakness more pronounced in grip strength; otherwise nonfocal MSK: no peripheral joint swelling/tenderness/warmth; back spines nontender Lymph: no cervical, axillary, inguinal LAD  Central line presence: none   Lab Results Lab Results  Component Value Date   WBC 5.2 04/06/2021   HGB 13.7 04/06/2021   HCT 40.8 04/06/2021   MCV 96.7 04/06/2021   PLT 288 04/06/2021    Lab Results  Component Value Date   CREATININE 0.68 04/06/2021   BUN 9 04/06/2021   NA 136 04/06/2021   K 4.3 04/06/2021   CL 105 04/06/2021   CO2 25 04/06/2021    Lab Results  Component Value Date   ALT 15 05/16/2020   AST 18 05/16/2020   ALKPHOS 71 05/16/2020   BILITOT 0.6 05/16/2020      Microbiology: Recent Results (from the past 240 hour(s))  SARS CORONAVIRUS 2 (TAT 6-24 HRS) Nasopharyngeal Nasopharyngeal Swab     Status: None   Collection Time: 04/06/21  7:02 PM   Specimen: Nasopharyngeal Swab  Result Value Ref Range Status   SARS Coronavirus 2 NEGATIVE NEGATIVE Final    Comment: (NOTE) SARS-CoV-2 target nucleic acids are NOT DETECTED.  The SARS-CoV-2 RNA is generally detectable in upper and lower respiratory specimens during the acute phase of infection. Negative results do not preclude SARS-CoV-2 infection, do not rule out co-infections with other pathogens, and should not be used as the sole basis for  treatment or other patient management decisions. Negative results must be combined with clinical observations, patient history, and epidemiological information. The expected result is Negative.  Fact Sheet for Patients: 04/08/21  Fact Sheet for Healthcare Providers: HairSlick.no  This test is not yet approved or cleared by the [place]ir[place]ir.com FDA and  has been authorized for detection and/or diagnosis of SARS-CoV-2 by FDA under an Emergency Use Authorization (EUA). This EUA will remain  in effect (meaning this test can be used) for the duration of the COVID-19 declaration under Se ction 564(b)(1) of the Act, 21 U.S.C. section 360bbb-3(b)(1), unless the authorization is terminated or revoked sooner.  Performed at [facility], [address], [place], [place]   CSF culture w Stat Gram Stain     Status: None (Preliminary result)   Collection Time: 04/06/21  8:32 PM   Specimen: CSF; Cerebrospinal Fluid  Result Value Ref Range Status  Specimen Description CSF  Final   Special Requests NONE  Final   Gram Stain   Final    WBC PRESENT, PREDOMINANTLY MONONUCLEAR GRAM POSITIVE COCCI IN CHAINS CRITICAL RESULT CALLED TO, READ BACK BY AND VERIFIED WITH: ELKE K. BY MESSAN H. AT 2210 ON 6 26 2022 CYTOSPIN SMEAR Performed at [facility], [address] [place], [place] [address]    Culture PENDING  Incomplete   Report Status PENDING  Incomplete     Serology:    Imaging: If present, new imagings (plain films, ct scans, and mri) have been personally visualized and interpreted; radiology reports have been reviewed. Decision making incorporated into the Impression / Recommendations.  6/27 mri brain No abnormality  6/26 c/t spine mri Cervical spine:   1. Patchy increased T2 signal mostly within the dorsal cervical cord from C3 to C6-C7, but also involving the right and left hemicords at C5-C6.  Some but not all of these areas demonstrate mild enhancement. Differential considerations include transverse myelitis, ADEM, and subacute combined degeneration. 2. Multilevel cervical spondylosis as described above. Severe neuroforaminal stenosis on the left at C3-C4 bilaterally at C4-C5.   Thoracic spine:   1. Normal MRI of the thoracic spine.     Raymondo Band, MD [facility] [phone] pager    04/07/2021, 12:35 PM

## 2021-04-07 NOTE — Progress Notes (Addendum)
Patient BP was elevated MD Robb Matar notified, Norvase 5 mg given per MD order, Patient is stable. Will continue to monitor.

## 2021-04-07 NOTE — Progress Notes (Addendum)
CSF Stain result shows Gram Positive cocci in chain Dr. Robb Matar notified.

## 2021-04-07 NOTE — Progress Notes (Signed)
[facility] night shift MedSurg coverage note.  The nursing staff reported that the patient had gram-positive cocci in chains on CSF gram stain.  However, the CSF looked clear and this does not fit the full clinical picture.  I have discussed this finding with neurology on-call.  We will give a dose of vancomycin and get some input from ID later this morning.  Sanda Klein, MD.

## 2021-04-07 NOTE — Progress Notes (Signed)
Pharmacy Antibiotic Note  Connor Andrews is a 50 y.o. male admitted on 04/06/2021 with  GPC in chains on CSF gram stain .  Pharmacy has been consulted for Vancomycin dosing. Noted MD states the CSF looked clear and pt doesn't appear infected. MD to get ID input this morning.  Plan: Vancomycin 1000mg  IV now then 750 mg IV Q 12 hrs. Goal AUC 400-550. Expected AUC: 492 SCr used: 0.8 Will f/u renal function, micro data, and pt's clinical condition Vanc levels prn F/u ID input in a.m.   Height: 5\' 10"  (177.8 cm) Weight: 55.7 kg (122 lb 14.4 oz) IBW/kg (Calculated) : 73  Temp (24hrs), Avg:98.2 F (36.8 C), Min:97.8 F (36.6 C), Max:98.6 F (37 C)  Recent Labs  Lab 04/06/21 1345  WBC 5.2  CREATININE 0.68    Estimated Creatinine Clearance: 86.1 mL/min (by C-G formula based on SCr of 0.68 mg/dL).    No Known Allergies  Antimicrobials this admission: 6/27 Vanc >>   Microbiology results: 6/26 CSF: GPC in chains  Thank you for allowing pharmacy to be a part of this patient's care.  7/27, PharmD, BCPS Please see amion for complete clinical pharmacist phone list 04/07/2021 2:34 AM

## 2021-04-07 NOTE — Progress Notes (Signed)
Pharmacy Antibiotic Note  Chirstopher Andrews is a 50 y.o. male admitted on 04/06/2021 with  GPC in chains on CSF gram stain .  Pharmacy has been consulted for vancomycin dosing.  Noted MD states the CSF looked clear and patient doesn't appear infected; however, will switch to vancomycin trough dosing for possible CNS infection.  Renal function stable, afebrile, WBC WNL.  Plan: Vanc 500mg  IV Q8H for goal trough 15-20 mcg/mL Monitor renal fxn, micro data, vanc trough at Css F/U ID consult   Height: 5\' 10"  (177.8 cm) Weight: 55.7 kg (122 lb 14.4 oz) IBW/kg (Calculated) : 73  Temp (24hrs), Avg:98.1 F (36.7 C), Min:97.8 F (36.6 C), Max:98.6 F (37 C)  Recent Labs  Lab 04/06/21 1345  WBC 5.2  CREATININE 0.68     Estimated Creatinine Clearance: 86.1 mL/min (by C-G formula based on SCr of 0.68 mg/dL).    No Known Allergies  Vanc 6/27 >>    6/26 CSF - GPC in chains on Gram stain  Lesley Galentine D. 7/26, PharmD, BCPS, BCCCP 04/07/2021, 9:55 AM

## 2021-04-08 LAB — COMPREHENSIVE METABOLIC PANEL
ALT: 18 U/L (ref 0–44)
AST: 16 U/L (ref 15–41)
Albumin: 4 g/dL (ref 3.5–5.0)
Alkaline Phosphatase: 55 U/L (ref 38–126)
Anion gap: 9 (ref 5–15)
BUN: 9 mg/dL (ref 6–20)
CO2: 24 mmol/L (ref 22–32)
Calcium: 9.6 mg/dL (ref 8.9–10.3)
Chloride: 101 mmol/L (ref 98–111)
Creatinine, Ser: 0.86 mg/dL (ref 0.61–1.24)
GFR, Estimated: >60 mL/min (ref >60)
Glucose, Bld: 156 mg/dL — ABNORMAL HIGH (ref 70–99)
Potassium: 3.9 mmol/L (ref 3.5–5.1)
Sodium: 134 mmol/L — ABNORMAL LOW (ref 135–145)
Total Bilirubin: 1.2 mg/dL (ref 0.3–1.2)
Total Protein: 6.8 g/dL (ref 6.5–8.1)

## 2021-04-08 LAB — CBC WITH DIFFERENTIAL/PLATELET
Abs Immature Granulocytes: 0.03 10*3/uL (ref 0.00–0.07)
Basophils Absolute: 0 10*3/uL (ref 0.0–0.1)
Basophils Relative: 0 %
Eosinophils Absolute: 0 10*3/uL (ref 0.0–0.5)
Eosinophils Relative: 0 %
HCT: 42.1 % (ref 39.0–52.0)
Hemoglobin: 14.3 g/dL (ref 13.0–17.0)
Immature Granulocytes: 1 %
Lymphocytes Relative: 7 %
Lymphs Abs: 0.4 10*3/uL — ABNORMAL LOW (ref 0.7–4.0)
MCH: 32.4 pg (ref 26.0–34.0)
MCHC: 34 g/dL (ref 30.0–36.0)
MCV: 95.2 fL (ref 80.0–100.0)
Monocytes Absolute: 0 10*3/uL — ABNORMAL LOW (ref 0.1–1.0)
Monocytes Relative: 1 %
Neutro Abs: 5.7 10*3/uL (ref 1.7–7.7)
Neutrophils Relative %: 91 %
Platelets: 289 10*3/uL (ref 150–400)
RBC: 4.42 MIL/uL (ref 4.22–5.81)
RDW: 12 % (ref 11.5–15.5)
WBC: 6.2 10*3/uL (ref 4.0–10.5)
nRBC: 0 % (ref 0.0–0.2)

## 2021-04-08 LAB — IGG CSF INDEX
Albumin CSF-mCnc: 35 mg/dL (ref 15–55)
Albumin: 4.4 g/dL (ref 3.8–4.9)
CSF IgG Index: 0.5 (ref 0.0–0.7)
IgG (Immunoglobin G), Serum: 895 mg/dL (ref 603–1613)
IgG, CSF: 3.3 mg/dL (ref 0.0–10.3)
IgG/Alb Ratio, CSF: 0.09 (ref 0.00–0.25)

## 2021-04-08 LAB — RHEUMATOID FACTOR: Rheumatoid fact SerPl-aCnc: 11.3 IU/mL (ref <14.0)

## 2021-04-08 LAB — ANA W/REFLEX IF POSITIVE: Anti Nuclear Antibody (ANA): NEGATIVE

## 2021-04-08 LAB — ANGIOTENSIN CONVERTING ENZYME, CSF: Angio Convert Enzyme: <1.5 U/L (ref 0.0–3.1)

## 2021-04-08 LAB — HSV 1/2 PCR, CSF
HSV-1 DNA: NEGATIVE
HSV-2 DNA: NEGATIVE

## 2021-04-08 LAB — ANTI-DNA ANTIBODY, DOUBLE-STRANDED: ds DNA Ab: 5 IU/mL (ref 0–9)

## 2021-04-08 LAB — VDRL, CSF: VDRL Quant, CSF: NONREACTIVE

## 2021-04-08 NOTE — Progress Notes (Signed)
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 Spoke with microbiology lab and repeat CSF cultures were without organisms on gram stain and recheck of previous set up was also without organisms indicating likely artifact/contaminant. Based on this information there does not appear to be any infectious process involved in his symptoms. Will stop all antibiotics and recommend continuing with Solu-medrol as prescribed by Neurology for transverse myelitis.  ID will sign off. Please re-consult if needed.   PLAN:  Discontinue antibiotics.  Continue Solumedrol per Neurology for presumed transverse myelitis.  ID will sign off.   Principal Problem:   Right sided weakness Active Problems:   HTN (hypertension)   Tobacco abuse   CSF abnormal   Weight loss    amLODipine  10 mg Oral Daily   enoxaparin (LOVENOX) injection  40 mg Subcutaneous Q24H   gabapentin  300 mg Oral TID   losartan  25 mg Oral Daily   sodium chloride flush  3 mL Intravenous Q12H   thiamine  100 mg Oral Daily   vitamin B-12  1,000 mcg Oral Daily    SUBJECTIVE:  Afebrile overnight with no acute events. No changes since evaluated yesterday.   No Known Allergies   Review of Systems: Review of Systems  Constitutional:  Negative for chills, fever and weight loss.  Respiratory:  Negative for cough, shortness of breath and wheezing.   Cardiovascular:  Negative for chest pain and leg swelling.  Gastrointestinal:  Negative for abdominal pain, constipation, diarrhea, nausea and vomiting.  Skin:  Negative for rash.  Neurological:  Positive for tingling and weakness.     OBJECTIVE: Vitals:   04/07/21 1808 04/08/21 0018 04/08/21 0535 04/08/21 1148  BP: 126/89 125/83 137/89 (!) 136/99  Pulse: 68 61 67 81  Resp: 16 17 18 18   Temp: 99 F (37.2 C) 98.7 F (37.1 C) 97.6  F (36.4 C) 97.7 F (36.5 C)  TempSrc: Oral Oral Oral Oral  SpO2: 97% 96% 100% 97%  Weight:      Height:       Body mass index is 17.63 kg/m.  Physical Exam Constitutional:      General: He is not in acute distress.    Appearance: He is well-developed.     Comments: Lying in bed with head of bed elevated; pleasant.   Cardiovascular:     Rate and Rhythm: Normal rate and regular rhythm.     Heart sounds: Normal heart sounds.  Pulmonary:     Effort: Pulmonary effort is normal.     Breath sounds: Normal breath sounds.  Skin:    General: Skin is warm and dry.  Neurological:     Mental Status: He is alert.     Comments: Numbess and tingling in right upper extremity.   Psychiatric:        Mood and Affect: Mood normal.    Lab Results Lab Results  Component Value Date   WBC 6.2 04/08/2021   HGB 14.3 04/08/2021   HCT 42.1 04/08/2021   MCV 95.2 04/08/2021   PLT 289 04/08/2021    Lab Results  Component Value Date   CREATININE 0.86 04/08/2021   BUN 9 04/08/2021   NA 134 (L) 04/08/2021   K 3.9 04/08/2021   CL 101 04/08/2021   CO2 24 04/08/2021  Lab Results  Component Value Date   ALT 18 04/08/2021   AST 16 04/08/2021   ALKPHOS 55 04/08/2021   BILITOT 1.2 04/08/2021     Microbiology: Recent Results (from the past 240 hour(s))  SARS CORONAVIRUS 2 (TAT 6-24 HRS) Nasopharyngeal Nasopharyngeal Swab     Status: None   Collection Time: 04/06/21  7:02 PM   Specimen: Nasopharyngeal Swab  Result Value Ref Range Status   SARS Coronavirus 2 NEGATIVE NEGATIVE Final    Comment: (NOTE) SARS-CoV-2 target nucleic acids are NOT DETECTED.  The SARS-CoV-2 RNA is generally detectable in upper and lower respiratory specimens during the acute phase of infection. Negative results do not preclude SARS-CoV-2 infection, do not rule out co-infections with other pathogens, and should not be used as the sole basis for treatment or other patient management decisions. Negative results  must be combined with clinical observations, patient history, and epidemiological information. The expected result is Negative.  Fact Sheet for Patients: HairSlick.no  Fact Sheet for Healthcare Providers: quierodirigir.com  This test is not yet approved or cleared by the [place] FDA and  has been authorized for detection and/or diagnosis of SARS-CoV-2 by FDA under an Emergency Use Authorization (EUA). This EUA will remain  in effect (meaning this test can be used) for the duration of the COVID-19 declaration under Se ction 564(b)(1) of the Act, 21 U.S.C. section 360bbb-3(b)(1), unless the authorization is terminated or revoked sooner.  Performed at [facility], [address], [place], [place] [address]   CSF culture w Stat Gram Stain     Status: None (Preliminary result)   Collection Time: 04/06/21  8:32 PM   Specimen: CSF; Cerebrospinal Fluid  Result Value Ref Range Status   Specimen Description CSF  Final   Special Requests NONE  Final   Gram Stain   Final    WBC PRESENT, PREDOMINANTLY MONONUCLEAR CRITICAL RESULT CALLED TO, READ BACK BY AND VERIFIED WITH: ELKE K. BY MESSAN H. AT 2210 ON 6 26 2022 CYTOSPIN SMEAR CORRECTED RESULTS PREVIOUSLY REPORTED AS: GRAM POSITIVE COCCI IN CHAINS CORRECTED RESULTS CALLED TO: DR Luan Pulling [id number] AT 1104 BY CM    Culture   Final    NO GROWTH 2 DAYS Performed at [facility], [address] [place], [place] [address]    Report Status PENDING  Incomplete     Marcos Eke, NP [facility]  04/08/2021  2:50 PM

## 2021-04-08 NOTE — Progress Notes (Signed)
Neurology Progress Note   S:// Patient seen and examined. Reports no changes. Reports pain in the right arm that has been bothersome. Received 1 dose of IV Solu-Medrol yesterday   O:// Current vital signs: BP 137/89 (BP Location: Left Arm)   Pulse 67   Temp 97.6 F (36.4 C) (Oral)   Resp 18   Ht 5\' 10"  (1.778 m)   Wt 55.7 kg   SpO2 100%   BMI 17.63 kg/m  Vital signs in last 24 hours: Temp:  [97.6 F (36.4 C)-99 F (37.2 C)] 97.6 F (36.4 C) (06/28 0535) Pulse Rate:  [61-68] 67 (06/28 0535) Resp:  [16-18] 18 (06/28 0535) BP: (108-137)/(83-89) 137/89 (06/28 0535) SpO2:  [96 %-100 %] 100 % (06/28 0535) GENERAL: Awake, alert in NAD HEENT: - Normocephalic and atraumatic, dry mm, no LN++, no Thyromegally LUNGS - Clear to auscultation bilaterally with no wheezes CV - S1S2 RRR, no m/r/g, equal pulses bilaterally. ABDOMEN - Soft, nontender, nondistended with normoactive BS Ext: warm, well perfused, intact peripheral pulses, no edema NEURO:  Mental Status: AA&Ox3  Language: Speech not dysarthric.  No aphasia. Cranial Nerves: PERRL . EOMI, visual fields full, no facial asymmetry, facial sensation intact, hearing intact, tongue/uvula/soft palate midline, normal sternocleidomastoid and trapezius muscle strength. No evidence of tongue atrophy or fibrillations Motor: Minimal grip strength weakness on the right, otherwise full strength right upper extremity.  Left upper extremity 5/5.  Right lower extremity 5/5.  Left lower extremity 5/5.04-05-1977 Tone: is normal and bulk is normal Sensation-diminished to touch on right hemibody. Coordination: FTN intact bilaterally, no ataxia in BLE. Gait- deferred DTRs: 2+ in bilateral upper extremities and mildly brisk in the patellar's bilaterally with mute plantars. Unchanged exam from yesterday  Medications  Current Facility-Administered Medications:    0.9 %  sodium chloride infusion, 250 mL, Intravenous, PRN, Marland Kitchen, MD   amLODipine  (NORVASC) tablet 10 mg, 10 mg, Oral, Daily, Orland Mustard, MD, 10 mg at 04/07/21 0839   enoxaparin (LOVENOX) injection 40 mg, 40 mg, Subcutaneous, Q24H, 04/09/21, MD, 40 mg at 04/07/21 2302   gabapentin (NEURONTIN) capsule 300 mg, 300 mg, Oral, TID, 2303, MD, 300 mg at 04/07/21 2301   gadobutrol (GADAVIST) 1 MMOL/ML injection 6 mL, 6 mL, Intravenous, Once PRN, 04/09/21, MD   HYDROcodone-acetaminophen (NORCO/VICODIN) 5-325 MG per tablet 1-2 tablet, 1-2 tablet, Oral, Q4H PRN, Erick Blinks, MD, 2 tablet at 04/07/21 1604   losartan (COZAAR) tablet 25 mg, 25 mg, Oral, Daily, Anwar, Shayan S, DO, 25 mg at 04/07/21 1137   methylPREDNISolone sodium succinate (SOLU-MEDROL) 1,000 mg in sodium chloride 0.9 % 50 mL IVPB, 1,000 mg, Intravenous, Daily, 04/09/21, MD, Last Rate: 58 mL/hr at 04/07/21 1909, 1,000 mg at 04/07/21 1909   senna-docusate (Senokot-S) tablet 1 tablet, 1 tablet, Oral, QHS PRN, 04/09/21, MD   sodium chloride flush (NS) 0.9 % injection 3 mL, 3 mL, Intravenous, Q12H, Orland Mustard, MD, 3 mL at 04/07/21 2318   sodium chloride flush (NS) 0.9 % injection 3 mL, 3 mL, Intravenous, PRN, 2319, MD, 3 mL at 04/07/21 2318   thiamine tablet 100 mg, 100 mg, Oral, Daily, Anwar, Shayan S, DO, 100 mg at 04/07/21 1416   traMADol (ULTRAM) tablet 50 mg, 50 mg, Oral, Q8H PRN, 04/09/21, MD, 50 mg at 04/06/21 2117   vancomycin (VANCOREADY) IVPB 500 mg/100 mL, 500 mg, Intravenous, Q8H, Dang, Thuy D, RPH, Last Rate: 100 mL/hr at 04/08/21 0402, 500 mg at 04/08/21 [phone]  vitamin B-12 (CYANOCOBALAMIN) tablet 1,000 mcg, 1,000 mcg, Oral, Daily, Lynwood Dawley S, DO, 1,000 mcg at 04/07/21 1416  Labs CBC    Component Value Date/Time   WBC 6.2 04/08/2021 0352   RBC 4.42 04/08/2021 0352   HGB 14.3 04/08/2021 0352   HGB 13.9 05/16/2020 1725   HCT 42.1 04/08/2021 0352   HCT 41.1 05/16/2020 1725   PLT 289 04/08/2021 0352   PLT 287 05/16/2020 1725   MCV 95.2  04/08/2021 0352   MCV 95 05/16/2020 1725   MCH 32.4 04/08/2021 0352   MCHC 34.0 04/08/2021 0352   RDW 12.0 04/08/2021 0352   RDW 12.0 05/16/2020 1725   LYMPHSABS 0.4 (L) 04/08/2021 0352   LYMPHSABS 1.8 05/31/2019 1521   MONOABS 0.0 (L) 04/08/2021 0352   EOSABS 0.0 04/08/2021 0352   EOSABS 0.1 05/31/2019 1521   BASOSABS 0.0 04/08/2021 0352   BASOSABS 0.1 05/31/2019 1521    CMP     Component Value Date/Time   NA 134 (L) 04/08/2021 0352   NA 138 05/16/2020 1725   K 3.9 04/08/2021 0352   CL 101 04/08/2021 0352   CO2 24 04/08/2021 0352   GLUCOSE 156 (H) 04/08/2021 0352   BUN 9 04/08/2021 0352   BUN 11 05/16/2020 1725   CREATININE 0.86 04/08/2021 0352   CALCIUM 9.6 04/08/2021 0352   PROT 6.8 04/08/2021 0352   PROT 7.0 05/16/2020 1725   ALBUMIN 4.0 04/08/2021 0352   ALBUMIN 5.1 (H) 05/16/2020 1725   AST 16 04/08/2021 0352   ALT 18 04/08/2021 0352   ALKPHOS 55 04/08/2021 0352   BILITOT 1.2 04/08/2021 0352   BILITOT 0.6 05/16/2020 1725   GFRNONAA >60 04/08/2021 0352   GFRAA 124 05/16/2020 1725   B12 290 CSF analysis 2 WBCs, total protein 46, glucose 57 CSF gram stain: Gram-positive cocci in chains  Imaging I have reviewed images in epic and the results pertinent to this consultation are: MRI brain with and without contrast with very nonspecific looking punctate T2 hyperintensities scattered in bilateral hemispheres not in a pattern suggestive of demyelination. MRI of the cervical spine: Patchy increased T2 signal mostly in the dorsal cervical cord from C3-C6 C7 but also involving the right and left hemicord at C5-C6.  Some of these areas demonstrate mild enhancement.  Differentials: Transverse myelitis/ADEM/subacute combined degeneration.  Discussed with neuroradiology.  Further differentials could include autoimmune conditions, anti-MO G myelitis as well as neuromyelitis optica and neurosarcoidosis.  Assessment: 50 year old man with past medical history of hypertension,  arthritis and low back pain presenting with right arm weakness and numbness for the last 6 to 8 months.  Neurological examination with decreased right hand grip strength along with right hemibody sensory diminishment in all modalities.  MRI with patchy T2 flair hyperintensity in the dorsal cervical cord from C3-C7 with mild enhancement.   CSF analysis shows 2 WBCs, total protein 46, glucose 57.  Based on his clinical course of 6 to 8 months-I would not of expect an acute infectious process but his CSF does seem to grow gram-positive cocci in chains-appreciate ID input that this might just be contaminant.  Differentials in this case are broad including demyelination, autoimmune as well as rare causes such as neurosarcoidosis.  Pattern of imaging and course is not suggestive of spinal cord ischemia.   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  He would rather stay inpatient and get this done rather than getting it at the infusion clinic.  Continue B12 and thiamine replacement  PT OT  Will follow   -- Milon Dikes, MD Neurologist [facility] Pager: [phone]

## 2021-04-08 NOTE — Progress Notes (Signed)
PROGRESS NOTE    Connor Andrews  WJX:[id number] DOB: 04/12/1971 DOA: 04/06/2021 PCP: Arnette Felts, FNP   Brief Narrative: 50 year old with past medical history significant for hypertension, tobacco dependence who presented to the emergency department with right upper extremity weakness and numbness for 6 months that has been progressively getting worse.  He also endorses weight loss of about 20 to 30 pounds for the last 6 months to 1 year.    Assessment & Plan:   Principal Problem:   Right sided weakness Active Problems:   HTN (hypertension)   Tobacco abuse   CSF abnormal   Weight loss   1-Right arm weakness and numbness: -Differential diagnosis transverse myelitis versus autoimmune versus infectious process -CSF initially grew GPC, repeated cultures no growth.  Initial GPC likely a contaminant.  ID was consulted and recommended discontinuation of IV antibiotics -He was a started on high-dose IV steroids, plan for 5 days. -Appreciate neurology evaluation. -RF negative, Anti double strand DNA pending, RPR negative,  -VDRL CSF non reactive. ANA negative. HSV negative -B 12; 290. Started on supplement.  -IgG index; negative. Oligoclonal band pending.   Unintentional weight loss: Unclear etiology. HIV; negative TSH normal Will need outpatient evaluation Quantiferon pending.   Hypertension: Continue with amlodipine, started on Cozaar.  Tobacco dependence; declined nicotine patch.  Counseled.         Estimated body mass index is 17.63 kg/m as calculated from the following:   Height as of this encounter:  (1.778 m).   Weight as of this encounter: 55.7 kg.   DVT prophylaxis: Lovenox Code Status: Full code Family Communication: Care discussed with patient Disposition Plan:  Status is: Inpatient  Remains inpatient appropriate because:IV treatments appropriate due to intensity of illness or inability to take PO  Dispo: The patient is from: Home               Anticipated d/c is to: Home              Patient currently is not medically stable to d/c.   Difficult to place patient No        Consultants:  Neurology ID  Procedures:  LP  Antimicrobials:    Subjective: He still report right arm weakness and numbness. Reports pain in his arm  Objective: Vitals:   04/07/21 1209 04/07/21 1808 04/08/21 0018 04/08/21 0535  BP: 108/85 126/89 125/83 137/89  Pulse: 67 68 61 67  Resp: Temp: 98.6 F (37 C) 99 F (37.2 C) 98.7 F (37.1 C) 97.6 F (36.4 C)  TempSrc: Oral Oral Oral Oral  SpO2: 98% 97% 96% 100%  Weight:      Height:        Intake/Output Summary (Last 24 hours) at 04/08/2021 0750 Last data filed at 04/07/2021 2300 Gross per 24 hour  Intake 340 ml  Output --  Net 340 ml   Filed Weights   04/06/21 2049  Weight: 55.7 kg    Examination:  General exam: Appears calm and comfortable  Respiratory system: Clear to auscultation. Respiratory effort normal. Cardiovascular system: S1 & S2 heard, RRR. No JVD, murmurs, rubs, gallops or clicks. No pedal edema. Gastrointestinal system: Abdomen is nondistended, soft and nontender. No organomegaly or masses felt. Normal bowel sounds heard. Central nervous system: Alert and oriented. Right arm hand with weakness Extremities: Symmetric 5 x 5 power. Skin: No rashes, lesions or ulcers Psychiatry: Judgement and insight appear normal. Mood & affect appropriate.  Data Reviewed: I have personally reviewed following labs and imaging studies  CBC: Recent Labs  Lab 04/06/21 1345 04/08/21 0352  WBC 5.2 6.2  NEUTROABS  --  5.7  HGB 13.7 14.3  HCT 40.8 42.1  MCV 96.7 95.2  PLT 288 289   Basic Metabolic Panel: Recent Labs  Lab 04/06/21 1345 04/08/21 0352  NA 136 134*  K 4.3 3.9  CL 105 101  CO2 25 24  GLUCOSE 93 156*  BUN 9 9  CREATININE 0.68 0.86  CALCIUM 9.0 9.6   GFR: Estimated Creatinine Clearance: 80.1 mL/min (by C-G formula based on SCr of 0.86  mg/dL). Liver Function Tests: Recent Labs  Lab 04/07/21 1111 04/08/21 0352  AST 18 16  ALT 17 18  ALKPHOS 57 55  BILITOT 1.0 1.2  PROT 6.5 6.8  ALBUMIN 4.0 4.0   No results for input(s): LIPASE, AMYLASE in the last 168 hours. No results for input(s): AMMONIA in the last 168 hours. Coagulation Profile: No results for input(s): INR, PROTIME in the last 168 hours. Cardiac Enzymes: No results for input(s): CKTOTAL, CKMB, CKMBINDEX, TROPONINI in the last 168 hours. BNP (last 3 results) No results for input(s): PROBNP in the last 8760 hours. HbA1C: No results for input(s): HGBA1C in the last 72 hours. CBG: No results for input(s): GLUCAP in the last 168 hours. Lipid Profile: No results for input(s): CHOL, HDL, LDLCALC, TRIG, CHOLHDL, LDLDIRECT in the last 72 hours. Thyroid Function Tests: Recent Labs    04/06/21 1741  TSH 1.071   Anemia Panel: Recent Labs    04/06/21 1741 04/06/21 2135  VITAMINB12 290  --   FOLATE  --  13.2   Sepsis Labs: No results for input(s): PROCALCITON, LATICACIDVEN in the last 168 hours.  Recent Results (from the past 240 hour(s))  SARS CORONAVIRUS 2 (TAT 6-24 HRS) Nasopharyngeal Nasopharyngeal Swab     Status: None   Collection Time: 04/06/21  7:02 PM   Specimen: Nasopharyngeal Swab  Result Value Ref Range Status   SARS Coronavirus 2 NEGATIVE NEGATIVE Final    Comment: (NOTE) SARS-CoV-2 target nucleic acids are NOT DETECTED.  The SARS-CoV-2 RNA is generally detectable in upper and lower respiratory specimens during the acute phase of infection. Negative results do not preclude SARS-CoV-2 infection, do not rule out co-infections with other pathogens, and should not be used as the sole basis for treatment or other patient management decisions. Negative results must be combined with clinical observations, patient history, and epidemiological information. The expected result is Negative.  Fact Sheet for  Patients: [employer]  Fact Sheet for Healthcare Providers: quierodirigir.com  This test is not yet approved or cleared by the [place] FDA and  has been authorized for detection and/or diagnosis of SARS-CoV-2 by FDA under an Emergency Use Authorization (EUA). This EUA will remain  in effect (meaning this test can be used) for the duration of the COVID-19 declaration under Se ction 564(b)(1) of the Act, 21 U.S.C. section 360bbb-3(b)(1), unless the authorization is terminated or revoked sooner.  Performed at [facility], [address], [place], [place] [address]   CSF culture w Stat Gram Stain     Status: None (Preliminary result)   Collection Time: 04/06/21  8:32 PM   Specimen: CSF; Cerebrospinal Fluid  Result Value Ref Range Status   Specimen Description CSF  Final   Special Requests NONE  Final   Gram Stain   Final    WBC PRESENT, PREDOMINANTLY MONONUCLEAR GRAM POSITIVE COCCI  IN CHAINS CRITICAL RESULT CALLED TO, READ BACK BY AND VERIFIED WITH: ELKE K. BY MESSAN H. AT 2210 ON 6 26 2022 CYTOSPIN SMEAR Performed at [facility], [address] [place], [place] [address]    Culture PENDING  Incomplete   Report Status PENDING  Incomplete         Radiology Studies: MR BRAIN WO CONTRAST  Result Date: 04/06/2021 CLINICAL DATA:  Neuro deficit, acute stroke suspected. EXAM: MRI HEAD WITHOUT CONTRAST TECHNIQUE: Multiplanar, multiecho pulse sequences of the brain and surrounding structures were obtained without intravenous contrast. COMPARISON:  CT head September 06, 2006. FINDINGS: Brain: No acute infarction, acute hemorrhage, hydrocephalus, extra-axial collection or mass lesion. Moderate scattered T2/FLAIR hyperintensities in the white matter, which are age advanced in number. Vascular: Major arterial flow voids are maintained at the skull base. Skull and upper cervical spine: Normal marrow signal.  Sinuses/Orbits: Mild ethmoid air cell and inferior maxillary sinus mucosal thickening. Unremarkable orbits. Other: Trace bilateral mastoid fluid. IMPRESSION: 1. No evidence of acute intracranial abnormality. Specifically, no acute infarct. 2. Moderate T2/FLAIR hyperintensities within the white matter, which are age-advanced in number. This finding is nonspecific but can be seen in the setting of chronic microvascular ischemia, a demyelinating process such as multiple sclerosis, chronic migraines, or as the sequelae of a prior infectious or inflammatory process. Electronically Signed   By: Feliberto Harts MD   On: 04/06/2021 16:40   MR BRAIN W CONTRAST  Result Date: 04/07/2021 CLINICAL DATA:  Brain mass or lesion. EXAM: MRI HEAD WITH CONTRAST TECHNIQUE: Multiplanar, multiecho pulse sequences of the brain and surrounding structures were obtained with intravenous contrast. CONTRAST:  79mL GADAVIST GADOBUTROL 1 MMOL/ML IV SOLN COMPARISON:  None. FINDINGS: Brain: No abnormal intracranial enhancement Vascular: Normal vascular enhancements. Skull and upper cervical spine: No evidence of bone lesion IMPRESSION: No abnormal enhancement or mass. Electronically Signed   By: Marnee Spring M.D.   On: 04/07/2021 05:42   MR CERVICAL SPINE W WO CONTRAST  Result Date: 04/06/2021 CLINICAL DATA:  Chronic right arm pain, numbness, and weakness, worse over the past 2 weeks. EXAM: MRI CERVICAL AND THORACIC SPINE WITHOUT AND WITH CONTRAST TECHNIQUE: Multiplanar and multiecho pulse sequences of the cervical spine, to include the craniocervical junction and cervicothoracic junction, and the thoracic spine, were obtained without and with intravenous contrast. CONTRAST:  76mL GADAVIST GADOBUTROL 1 MMOL/ML IV SOLN COMPARISON:  Thoracic spine x-rays dated November 21, 2007. FINDINGS: MRI CERVICAL SPINE FINDINGS Alignment: Straightening of the normal cervical lordosis. No listhesis. Vertebrae: No fracture, evidence of discitis, or bone  lesion. Scattered degenerative endplate marrow changes. Cord: There is patchy increased T2 signal mostly within the dorsal cord from C3 to C6-C7, but also involving the right and left hemicords at C5-C6. Some but not all of these areas demonstrate mild enhancement. Posterior Fossa, vertebral arteries, paraspinal tissues: Negative. Disc levels: C2-C3:  Negative. C3-C4: Negative disc. Left greater than right uncovertebral hypertrophy. Severe left and moderate right neuroforaminal stenosis. No spinal canal stenosis. C4-C5: Small circumferential disc osteophyte complex. Right greater than left uncovertebral hypertrophy. Mild spinal canal stenosis. Severe bilateral neuroforaminal stenosis. C5-C6: Mild disc bulging. Right uncovertebral hypertrophy. Mild spinal canal stenosis. Mild to moderate right neuroforaminal stenosis. No left neuroforaminal stenosis. C6-C7: Mild disc bulging. Bilateral uncovertebral hypertrophy. Moderate bilateral neuroforaminal stenosis. No spinal canal stenosis. C7-T1: Mild disc bulging. Mild bilateral neuroforaminal stenosis. No spinal canal stenosis. MRI THORACIC SPINE FINDINGS Alignment:  Physiologic. Vertebrae: No fracture, evidence of discitis, or bone  lesion. Cord:  Normal signal and morphology.  No intradural enhancement. Paraspinal and other soft tissues: Negative. Disc levels: Disc heights and hydration are preserved. No significant disc bulge or herniation. No stenosis. IMPRESSION: Cervical spine: 1. Patchy increased T2 signal mostly within the dorsal cervical cord from C3 to C6-C7, but also involving the right and left hemicords at C5-C6. Some but not all of these areas demonstrate mild enhancement. Differential considerations include transverse myelitis, ADEM, and subacute combined degeneration. 2. Multilevel cervical spondylosis as described above. Severe neuroforaminal stenosis on the left at C3-C4 bilaterally at C4-C5. Thoracic spine: 1. Normal MRI of the thoracic spine.  Electronically Signed   By: Obie Dredge M.D.   On: 04/06/2021 16:45   MR THORACIC SPINE W WO CONTRAST  Result Date: 04/06/2021 CLINICAL DATA:  Chronic right arm pain, numbness, and weakness, worse over the past 2 weeks. EXAM: MRI CERVICAL AND THORACIC SPINE WITHOUT AND WITH CONTRAST TECHNIQUE: Multiplanar and multiecho pulse sequences of the cervical spine, to include the craniocervical junction and cervicothoracic junction, and the thoracic spine, were obtained without and with intravenous contrast. CONTRAST:  77mL GADAVIST GADOBUTROL 1 MMOL/ML IV SOLN COMPARISON:  Thoracic spine x-rays dated November 21, 2007. FINDINGS: MRI CERVICAL SPINE FINDINGS Alignment: Straightening of the normal cervical lordosis. No listhesis. Vertebrae: No fracture, evidence of discitis, or bone lesion. Scattered degenerative endplate marrow changes. Cord: There is patchy increased T2 signal mostly within the dorsal cord from C3 to C6-C7, but also involving the right and left hemicords at C5-C6. Some but not all of these areas demonstrate mild enhancement. Posterior Fossa, vertebral arteries, paraspinal tissues: Negative. Disc levels: C2-C3:  Negative. C3-C4: Negative disc. Left greater than right uncovertebral hypertrophy. Severe left and moderate right neuroforaminal stenosis. No spinal canal stenosis. C4-C5: Small circumferential disc osteophyte complex. Right greater than left uncovertebral hypertrophy. Mild spinal canal stenosis. Severe bilateral neuroforaminal stenosis. C5-C6: Mild disc bulging. Right uncovertebral hypertrophy. Mild spinal canal stenosis. Mild to moderate right neuroforaminal stenosis. No left neuroforaminal stenosis. C6-C7: Mild disc bulging. Bilateral uncovertebral hypertrophy. Moderate bilateral neuroforaminal stenosis. No spinal canal stenosis. C7-T1: Mild disc bulging. Mild bilateral neuroforaminal stenosis. No spinal canal stenosis. MRI THORACIC SPINE FINDINGS Alignment:  Physiologic. Vertebrae: No  fracture, evidence of discitis, or bone lesion. Cord:  Normal signal and morphology.  No intradural enhancement. Paraspinal and other soft tissues: Negative. Disc levels: Disc heights and hydration are preserved. No significant disc bulge or herniation. No stenosis. IMPRESSION: Cervical spine: 1. Patchy increased T2 signal mostly within the dorsal cervical cord from C3 to C6-C7, but also involving the right and left hemicords at C5-C6. Some but not all of these areas demonstrate mild enhancement. Differential considerations include transverse myelitis, ADEM, and subacute combined degeneration. 2. Multilevel cervical spondylosis as described above. Severe neuroforaminal stenosis on the left at C3-C4 bilaterally at C4-C5. Thoracic spine: 1. Normal MRI of the thoracic spine. Electronically Signed   By: Obie Dredge M.D.   On: 04/06/2021 16:45        Scheduled Meds:  amLODipine  10 mg Oral Daily   enoxaparin (LOVENOX) injection  40 mg Subcutaneous Q24H   gabapentin  300 mg Oral TID   losartan  25 mg Oral Daily   sodium chloride flush  3 mL Intravenous Q12H   thiamine  100 mg Oral Daily   vitamin B-12  1,000 mcg Oral Daily   Continuous Infusions:  sodium chloride     methylPREDNISolone (SOLU-MEDROL) injection 1,000 mg (04/07/21 1909)   vancomycin  500 mg (04/08/21 0402)     LOS: 2 days    Time spent: 35 minutes    Tammela Bales A Jong Rickman, MD [facility]   If 7PM-7AM, please contact night-coverage [email or web address]  04/08/2021, 7:50 AM

## 2021-04-09 LAB — BASIC METABOLIC PANEL
Anion gap: 5 (ref 5–15)
BUN: 9 mg/dL (ref 6–20)
CO2: 27 mmol/L (ref 22–32)
Calcium: 9.7 mg/dL (ref 8.9–10.3)
Chloride: 104 mmol/L (ref 98–111)
Creatinine, Ser: 0.75 mg/dL (ref 0.61–1.24)
GFR, Estimated: >60 mL/min (ref >60)
Glucose, Bld: 120 mg/dL — ABNORMAL HIGH (ref 70–99)
Potassium: 4.3 mmol/L (ref 3.5–5.1)
Sodium: 136 mmol/L (ref 135–145)

## 2021-04-09 LAB — NEUROMYELITIS OPTICA AUTOAB, IGG: NMO-IgG: <1.5 U/mL (ref 0.0–3.0)

## 2021-04-09 LAB — MISC LABCORP TEST (SEND OUT): Labcorp test code: 505310

## 2021-04-09 LAB — VITAMIN B1: Vitamin B1 (Thiamine): 116.7 nmol/L (ref 66.5–200.0)

## 2021-04-09 NOTE — Progress Notes (Signed)
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  Recs: Complete 5d IVMP and then d/c with OP f/u Continue B12 and thiamine repletion PT OT  Please call neurology as needed.   -- Milon Dikes, MD Neurologist [facility] Pager: [phone]

## 2021-04-09 NOTE — Progress Notes (Signed)
PROGRESS NOTE    Connor Andrews  [id number]:[id number] DOB: 1970-11-04 DOA: 04/06/2021 PCP: Arnette Felts, FNP   Brief Narrative: 50 year old with past medical history significant for hypertension, tobacco dependence who presented to the emergency department with right upper extremity weakness and numbness for 6 months that has been progressively getting worse.  He also endorses weight loss of about 20 to 30 pounds for the last 6 months to 1 year.    Assessment & Plan:   Principal Problem:   Right sided weakness Active Problems:   HTN (hypertension)   Tobacco abuse   CSF abnormal   Weight loss   1-Right arm weakness and numbness: -Differential diagnosis transverse myelitis versus autoimmune , as rare causes of neurosarcoidosis.  -CSF initially grew GPC, repeated cultures no growth.  Initial GPC likely a contaminant.  ID was consulted and recommended discontinuation of IV antibiotics. -He was a started on high-dose IV steroids, plan for 5 days. -Appreciate neurology evaluation. -RF negative, Anti double strand DNA negative,  RPR negative,  -VDRL CSF non reactive. ANA negative. HSV negative, Neuromyelitis optica negative -B 12; 290. Started on supplement.  -IgG index; negative. Oligoclonal band pending. CMV DNA pending.  -Day 3 IV Steroids.   Unintentional weight loss: Unclear etiology. HIV; negative TSH normal Will need outpatient evaluation Quantiferon pending.   Hypertension: Continue with amlodipine, started on Cozaar.  Tobacco dependence; declined nicotine patch.  Counseled.         Estimated body mass index is 17.63 kg/m as calculated from the following:   Height as of this encounter: 5\' 10"  (1.778 m).   Weight as of this encounter: 55.7 kg.   DVT prophylaxis: Lovenox Code Status: Full code Family Communication: Care discussed with patient Disposition Plan:  Status is: Inpatient  Remains inpatient appropriate because:IV treatments appropriate due to intensity  of illness or inability to take PO  Dispo: The patient is from: Home              Anticipated d/c is to: Home              Patient currently is not medically stable to d/c.   Difficult to place patient No        Consultants:  Neurology ID  Procedures:  LP  Antimicrobials:    Subjective: Symptoms remain the same. Maybe some improvement of weakness.   Objective: Vitals:   04/08/21 1703 04/08/21 2359 04/09/21 0559 04/09/21 1206  BP: (!) 134/92 123/86 136/90 130/83  Pulse: 92 74 71 84  Resp: 18 18 18 16   Temp: 98.6 F (37 C) 98.2 F (36.8 C) 97.9 F (36.6 C) 98.4 F (36.9 C)  TempSrc: Oral Oral Oral Oral  SpO2: 98% 97% 99% 98%  Weight:      Height:        Intake/Output Summary (Last 24 hours) at 04/09/2021 1552 Last data filed at 04/08/2021 1600 Gross per 24 hour  Intake 360 ml  Output --  Net 360 ml    Filed Weights   04/06/21 2049  Weight: 55.7 kg    Examination:  General exam: NAD Respiratory system: CTA Cardiovascular system: S 1, S 2 RRR Gastrointestinal system: BS present, soft,  nt Central nervous system: alert.  Right arm hand with weakness Extremities:  Symmetric power Skin: No rashes   Data Reviewed: I have personally reviewed following labs and imaging studies  CBC: Recent Labs  Lab 04/06/21 1345 04/08/21 0352  WBC 5.2 6.2  NEUTROABS  --  5.7  HGB 13.7 14.3  HCT 40.8 42.1  MCV 96.7 95.2  PLT 288 289    Basic Metabolic Panel: Recent Labs  Lab 04/06/21 1345 04/08/21 0352 04/09/21 0347  NA 136 134* 136  K 4.3 3.9 4.3  CL 105 101 104  CO2 25 24 27   GLUCOSE 93 156* 120*  BUN 9 9 9   CREATININE 0.68 0.86 0.75  CALCIUM 9.0 9.6 9.7    GFR: Estimated Creatinine Clearance: 86.1 mL/min (by C-G formula based on SCr of 0.75 mg/dL). Liver Function Tests: Recent Labs  Lab 04/06/21 2005 04/07/21 1111 04/08/21 0352  AST  --  18 16  ALT  --  17 18  ALKPHOS  --  57 55  BILITOT  --  1.0 1.2  PROT  --  6.5 6.8  ALBUMIN 4.4  4.0 4.0    No results for input(s): LIPASE, AMYLASE in the last 168 hours. No results for input(s): AMMONIA in the last 168 hours. Coagulation Profile: No results for input(s): INR, PROTIME in the last 168 hours. Cardiac Enzymes: No results for input(s): CKTOTAL, CKMB, CKMBINDEX, TROPONINI in the last 168 hours. BNP (last 3 results) No results for input(s): PROBNP in the last 8760 hours. HbA1C: No results for input(s): HGBA1C in the last 72 hours. CBG: No results for input(s): GLUCAP in the last 168 hours. Lipid Profile: No results for input(s): CHOL, HDL, LDLCALC, TRIG, CHOLHDL, LDLDIRECT in the last 72 hours. Thyroid Function Tests: Recent Labs    04/06/21 1741  TSH 1.071    Anemia Panel: Recent Labs    04/06/21 1741 04/06/21 2135  VITAMINB12 290  --   FOLATE  --  13.2    Sepsis Labs: No results for input(s): PROCALCITON, LATICACIDVEN in the last 168 hours.  Recent Results (from the past 240 hour(s))  SARS CORONAVIRUS 2 (TAT 6-24 HRS) Nasopharyngeal Nasopharyngeal Swab     Status: None   Collection Time: 04/06/21  7:02 PM   Specimen: Nasopharyngeal Swab  Result Value Ref Range Status   SARS Coronavirus 2 NEGATIVE NEGATIVE Final    Comment: (NOTE) SARS-CoV-2 target nucleic acids are NOT DETECTED.  The SARS-CoV-2 RNA is generally detectable in upper and lower respiratory specimens during the acute phase of infection. Negative results do not preclude SARS-CoV-2 infection, do not rule out co-infections with other pathogens, and should not be used as the sole basis for treatment or other patient management decisions. Negative results must be combined with clinical observations, patient history, and epidemiological information. The expected result is Negative.  Fact Sheet for Patients: 2136  Fact Sheet for Healthcare Providers: 04/08/21  This test is not yet approved or cleared by the [employer] FDA and  has been authorized for detection and/or diagnosis of SARS-CoV-2 by FDA under an Emergency Use Authorization (EUA). This EUA will remain  in effect (meaning this test can be used) for the duration of the COVID-19 declaration under Se ction 564(b)(1) of the Act, 21 U.S.C. section 360bbb-3(b)(1), unless the authorization is terminated or revoked sooner.  Performed at [facility], [address] [place], [address] [place]   CSF culture w Stat Gram Stain     Status: None (Preliminary result)   Collection Time: 04/06/21  8:32 PM   Specimen: CSF; Cerebrospinal Fluid  Result Value Ref Range Status   Specimen Description CSF  Final   Special Requests NONE  Final   Gram Stain   Final    WBC PRESENT, PREDOMINANTLY MONONUCLEAR CRITICAL RESULT  CALLED TO, READ BACK BY AND VERIFIED WITH: ELKE K. BY MESSAN H. AT 2210 ON 6 26 2022 CYTOSPIN SMEAR CORRECTED RESULTS PREVIOUSLY REPORTED AS: GRAM POSITIVE COCCI IN CHAINS CORRECTED RESULTS CALLED TO: DR Luan Pulling [id number] AT 1104 BY CM    Culture   Final    NO GROWTH 3 DAYS Performed at [facility], [address], [place], [place] [address]    Report Status PENDING  Incomplete          Radiology Studies: No results found.      Scheduled Meds:  amLODipine  10 mg Oral Daily   enoxaparin (LOVENOX) injection  40 mg Subcutaneous Q24H   gabapentin  300 mg Oral TID   losartan  25 mg Oral Daily   sodium chloride flush  3 mL Intravenous Q12H   thiamine  100 mg Oral Daily   vitamin B-12  1,000 mcg Oral Daily   Continuous Infusions:  sodium chloride     methylPREDNISolone (SOLU-MEDROL) injection 1,000 mg (04/09/21 1000)     LOS: 3 days    Time spent: 35 minutes    Markevion Lattin A Taro Hidrogo, MD [facility]   If 7PM-7AM, please contact night-coverage [email or web address]  04/09/2021, 3:52 PM

## 2021-04-10 LAB — VARICELLA-ZOSTER BY PCR: Varicella-Zoster, PCR: NEGATIVE

## 2021-04-10 LAB — QUANTIFERON-TB GOLD PLUS (RQFGPL)
QuantiFERON Mitogen Value: >10 IU/mL
QuantiFERON Nil Value: 0.02 IU/mL
QuantiFERON TB1 Ag Value: 0 IU/mL
QuantiFERON TB2 Ag Value: 0.01 IU/mL

## 2021-04-10 LAB — QUANTIFERON-TB GOLD PLUS: QuantiFERON-TB Gold Plus: NEGATIVE

## 2021-04-10 LAB — CSF CULTURE W GRAM STAIN: Culture: NO GROWTH

## 2021-04-10 LAB — OLIGOCLONAL BANDS, CSF + SERM

## 2021-04-10 NOTE — Progress Notes (Signed)
PROGRESS NOTE    Connor Andrews  EPP:[id number] DOB: [birth year] DOA: 04/06/2021 PCP: Arnette Felts, FNP   Brief Narrative: 50 year old with past medical history significant for hypertension, tobacco dependence who presented to the emergency department with right upper extremity weakness and numbness for 6 months that has been progressively getting worse.  He also endorses weight loss of about 20 to 30 pounds for the last 6 months to 1 year.    Assessment & Plan:   Principal Problem:   Right sided weakness Active Problems:   HTN (hypertension)   Tobacco abuse   CSF abnormal   Weight loss   1-Right arm weakness and numbness: -Differential diagnosis transverse myelitis versus autoimmune , as rare causes of neurosarcoidosis.  -CSF initially grew GPC, repeated cultures no growth.  Initial GPC likely a contaminant.  ID was consulted and recommended discontinuation of IV antibiotics. -He was a started on high-dose IV steroids, plan for 5 days. -Appreciate neurology evaluation. -RF negative, Anti double strand DNA negative,  RPR negative,  -VDRL CSF non reactive. ANA negative. HSV negative, Neuromyelitis optica negative, Double strand DNA negative.  -B 12; 290. Started on supplement.  -IgG index; negative. Oligoclonal band pending. CMV DNA pending.  -Day 4 IV Steroids.   Unintentional weight loss: Unclear etiology. HIV; negative TSH normal Will need outpatient evaluation Quantiferon  negative  Hypertension: Continue with amlodipine, started on Cozaar.  Tobacco dependence; declined nicotine patch.  Counseled.         Estimated body mass index is 17.63 kg/m as calculated from the following:   Height as of this encounter: 5\' 10"  (1.778 m).   Weight as of this encounter: 55.7 kg.   DVT prophylaxis: Lovenox Code Status: Full code Family Communication: Care discussed with patient Disposition Plan:  Status is: Inpatient  Remains inpatient appropriate because:IV treatments  appropriate due to intensity of illness or inability to take PO  Dispo: The patient is from: Home              Anticipated d/c is to: Home              Patient currently is not medically stable to d/c. Discharge home tomorrow after last dose steroids.    Difficult to place patient No        Consultants:  Neurology ID  Procedures:  LP  Antimicrobials:    Subjective: He report symptoms are the same  Objective: Vitals:   04/09/21 2256 04/10/21 0514 04/10/21 0903 04/10/21 1111  BP: (!) 138/92 (!) 135/95 126/86 (!) 147/98  Pulse: 87 65  74  Resp: 17 18  18   Temp: 98.5 F (36.9 C) 98.6 F (37 C)  97.8 F (36.6 C)  TempSrc: Oral Oral  Oral  SpO2: 99% 100%  98%  Weight:      Height:        Intake/Output Summary (Last 24 hours) at 04/10/2021 1534 Last data filed at 04/10/2021 0300 Gross per 24 hour  Intake 434.19 ml  Output --  Net 434.19 ml    Filed Weights   04/06/21 2049  Weight: 55.7 kg    Examination:  General exam: NAD Respiratory system: CTA Cardiovascular system: S 1, S 2 RRR Gastrointestinal system: BS present,soft, nt Central nervous system: alert.  Right arm hand with weakness Extremities:  no edema Skin: No rash   Data Reviewed: I have personally reviewed following labs and imaging studies  CBC: Recent Labs  Lab 04/06/21 1345 04/08/21 0352  WBC 5.2 6.2  NEUTROABS  --  5.7  HGB 13.7 14.3  HCT 40.8 42.1  MCV 96.7 95.2  PLT 288 289    Basic Metabolic Panel: Recent Labs  Lab 04/06/21 1345 04/08/21 0352 04/09/21 0347  NA 136 134* 136  K 4.3 3.9 4.3  CL 105 101 104  CO2 25 24 27   GLUCOSE 93 156* 120*  BUN 9 9 9   CREATININE 0.68 0.86 0.75  CALCIUM 9.0 9.6 9.7    GFR: Estimated Creatinine Clearance: 86.1 mL/min (by C-G formula based on SCr of 0.75 mg/dL). Liver Function Tests: Recent Labs  Lab 04/06/21 2005 04/07/21 1111 04/08/21 0352  AST  --  18 16  ALT  --  17 18  ALKPHOS  --  57 55  BILITOT  --  1.0 1.2  PROT   --  6.5 6.8  ALBUMIN 4.4 4.0 4.0    No results for input(s): LIPASE, AMYLASE in the last 168 hours. No results for input(s): AMMONIA in the last 168 hours. Coagulation Profile: No results for input(s): INR, PROTIME in the last 168 hours. Cardiac Enzymes: No results for input(s): CKTOTAL, CKMB, CKMBINDEX, TROPONINI in the last 168 hours. BNP (last 3 results) No results for input(s): PROBNP in the last 8760 hours. HbA1C: No results for input(s): HGBA1C in the last 72 hours. CBG: No results for input(s): GLUCAP in the last 168 hours. Lipid Profile: No results for input(s): CHOL, HDL, LDLCALC, TRIG, CHOLHDL, LDLDIRECT in the last 72 hours. Thyroid Function Tests: No results for input(s): TSH, T4TOTAL, FREET4, T3FREE, THYROIDAB in the last 72 hours.  Anemia Panel: No results for input(s): VITAMINB12, FOLATE, FERRITIN, TIBC, IRON, RETICCTPCT in the last 72 hours.  Sepsis Labs: No results for input(s): PROCALCITON, LATICACIDVEN in the last 168 hours.  Recent Results (from the past 240 hour(s))  SARS CORONAVIRUS 2 (TAT 6-24 HRS) Nasopharyngeal Nasopharyngeal Swab     Status: None   Collection Time: 04/06/21  7:02 PM   Specimen: Nasopharyngeal Swab  Result Value Ref Range Status   SARS Coronavirus 2 NEGATIVE NEGATIVE Final    Comment: (NOTE) SARS-CoV-2 target nucleic acids are NOT DETECTED.  The SARS-CoV-2 RNA is generally detectable in upper and lower respiratory specimens during the acute phase of infection. Negative results do not preclude SARS-CoV-2 infection, do not rule out co-infections with other pathogens, and should not be used as the sole basis for treatment or other patient management decisions. Negative results must be combined with clinical observations, patient history, and epidemiological information. The expected result is Negative.  Fact Sheet for Patients: 04/10/21  Fact Sheet for Healthcare  Providers: 04/08/21  This test is not yet approved or cleared by the [employer] FDA and  has been authorized for detection and/or diagnosis of SARS-CoV-2 by FDA under an Emergency Use Authorization (EUA). This EUA will remain  in effect (meaning this test can be used) for the duration of the COVID-19 declaration under Se ction 564(b)(1) of the Act, 21 U.S.C. section 360bbb-3(b)(1), unless the authorization is terminated or revoked sooner.  Performed at [facility], [address], [place], [address] [place]   CSF culture w Stat Gram Stain     Status: None   Collection Time: 04/06/21  8:32 PM   Specimen: CSF; Cerebrospinal Fluid  Result Value Ref Range Status   Specimen Description CSF  Final   Special Requests NONE  Final   Gram Stain   Final    WBC PRESENT, PREDOMINANTLY MONONUCLEAR CRITICAL RESULT CALLED TO,  READ BACK BY AND VERIFIED WITH: ELKE K. BY MESSAN H. AT 2210 ON 6 26 2022 CYTOSPIN SMEAR CORRECTED RESULTS PREVIOUSLY REPORTED AS: GRAM POSITIVE COCCI IN CHAINS CORRECTED RESULTS CALLED TO: DR Luan Pulling [id number] AT 1104 BY CM    Culture   Final    NO GROWTH 3 DAYS Performed at [facility], [address] [place], [place] [address]    Report Status 04/10/2021 FINAL  Final          Radiology Studies: No results found.      Scheduled Meds:  amLODipine  10 mg Oral Daily   enoxaparin (LOVENOX) injection  40 mg Subcutaneous Q24H   gabapentin  300 mg Oral TID   losartan  25 mg Oral Daily   sodium chloride flush  3 mL Intravenous Q12H   thiamine  100 mg Oral Daily   vitamin B-12  1,000 mcg Oral Daily   Continuous Infusions:  sodium chloride     methylPREDNISolone (SOLU-MEDROL) injection 1,000 mg (04/10/21 0906)     LOS: 4 days    Time spent: 35 minutes    Maddeline Roorda A Ceferino Lang, MD [facility]   If 7PM-7AM, please contact night-coverage [email or web address]  04/10/2021, 3:34 PM

## 2021-04-10 NOTE — TOC Initial Note (Signed)
Transition of Care [facility]) - Initial/Assessment Note    Patient Details  Name: Connor Andrews MRN: [id number] Date of Birth: July 02, 1971  Transition of [facility]) CM/SW Contact:    Kingsley Plan, RN Phone Number: 04/10/2021, 3:46 PM  Clinical Narrative:                 Spoke to patient at bedside. Confirmed face sheet information.   Patient's PCP is DR Arnette Felts.   Patient lives with his parents.   OT recommending OP OT. Patient in agreement and has transportation to [facility] location. Await PT recommendation.  IF PT recommendation for OT PT, will order and place information on AVS for patient to call and schedule appointment. Patient voiced understanding.    OT asked NCM to speak to patient regarding disability, patient has already called his employer and started process and called department of social services.   Patient does not have insurance. WIll await discharge prescriptions and see if covered by [facility].    Expected Discharge Plan: Home/Self Care     Patient Goals and CMS Choice Patient states their goals for this hospitalization and ongoing recovery are:: to return to home CMS Medicare.gov Compare Post Acute Care list provided to:: Patient Choice offered to / list presented to : Patient (OP OT)  Expected Discharge Plan and Services Expected Discharge Plan: Home/Self Care In-house Referral: Financial Counselor Discharge Planning Services: CM Consult, Indigent Health Clinic, [facility] Program, Medication Assistance   Living arrangements for the past 2 months: Single Family Home                 DME Arranged: N/A DME Agency: NA       HH Arranged: NA          Prior Living Arrangements/Services Living arrangements for the past 2 months: Single Family Home Lives with:: Parents Patient language and need for interpreter reviewed:: Yes        Need for Family Participation in Patient Care: Yes (Comment) Care giver support system in place?: Yes (comment)    Criminal Activity/Legal Involvement Pertinent to Current Situation/Hospitalization: No - Comment as needed  Activities of Daily Living Home Assistive Devices/Equipment: None ADL Screening (condition at time of admission) Patient's cognitive ability adequate to safely complete daily activities?: Yes Is the patient deaf or have difficulty hearing?: No Does the patient have difficulty seeing, even when wearing glasses/contacts?: No Does the patient have difficulty concentrating, remembering, or making decisions?: No Patient able to express need for assistance with ADLs?: No Does the patient have difficulty dressing or bathing?: No Independently performs ADLs?: Yes (appropriate for developmental age) Does the patient have difficulty walking or climbing stairs?: No Weakness of Legs: None Weakness of Arms/Hands: Right  Permission Sought/Granted   Permission granted to share information with : No              Emotional Assessment Appearance:: Appears stated age Attitude/Demeanor/Rapport: Engaged Affect (typically observed): Accepting Orientation: : Oriented to Self, Oriented to Place, Oriented to  Time, Oriented to Situation Alcohol / Substance Use: Not Applicable Psych Involvement: No (comment)  Admission diagnosis:  Upper extremity weakness [R29.898] Right sided weakness [R53.1] Patient Active Problem List   Diagnosis Date Noted   CSF abnormal    Weight loss    HTN (hypertension) 04/06/2021   Tobacco abuse 04/06/2021   Right sided weakness 04/06/2021   PCP:  Arnette Felts, FNP Pharmacy:   [id number] [address] [place]), [place] - [address]  [address] [place] ([place]) [place] [address] Phone: [phone] Fax: [phone]  [facility] [address] [place] [address] Phone: [phone] Fax: [phone]     Social Determinants of Health (SDOH) Interventions    Readmission Risk Interventions No flowsheet data  found.

## 2021-04-10 NOTE — Evaluation (Signed)
Occupational Therapy Evaluation Patient Details Name: Connor Andrews MRN: [id number] DOB: 1971/06/10 Today's Date: 04/10/2021    History of Present Illness 50 year old who presented to the emergency department with right upper extremity weakness and numbness for 6 months that has been progressively getting worse.  He also endorses weight loss of about 20 to 30 pounds for the last 6 months to 1 year. Pt diagnosed with transverse myelitis - further workup to determine origin of TM. PMH: HTN, tobacco dependence   Clinical Impression   PTA, pt lives with adult son (home for the summer from college) and reports Independence with all daily tasks though ADLs and current job duties have become progressively difficult due to R UE deficits. Pt with decreased strength (3+/5) of R dominant UE and decreased sensation impacting ability to complete basic tasks. Collaborated with pt on compensatory strategies to implement during ADLs/IADLs, as well as safety precautions due to impaired sensation. Provided various strengthening and coordination exercises for R UE with handouts included. Recommend OP OT follow-up to maximize use of dominant R UE and return to normal activities.     Follow Up Recommendations  Outpatient OT    Equipment Recommendations  None recommended by OT    Recommendations for Other Services       Precautions / Restrictions Precautions Precautions: Other (comment) Precaution Comments: R UE weakness/numbness Restrictions Weight Bearing Restrictions: No      Mobility Bed Mobility Overal bed mobility: Independent                  Transfers                      Balance                                           ADL either performed or assessed with clinical judgement   ADL Overall ADL's : Needs assistance/impaired Eating/Feeding: Set up;Sitting   Grooming: Set up;Standing   Upper Body Bathing: Minimal assistance   Lower Body Bathing:  Independent   Upper Body Dressing : Minimal assistance   Lower Body Dressing: Minimal assistance   Toilet Transfer: Independent   Toileting- Clothing Manipulation and Hygiene: Set up         General ADL Comments: Discussed compensatory strategies for ADLs due to R UE weakness, decreased sensation impacting ability to use dominant UE. Pt verbalizes completing many of these strategies, receptive to other strategies. Provided various UE HEPs with squeeze ball, theraputty, and gentle strengthening with theraband     Vision Patient Visual Report: No change from baseline Vision Assessment?: No apparent visual deficits     Perception     Praxis      Pertinent Vitals/Pain Pain Assessment: Faces Faces Pain Scale: Hurts little more Pain Location: RUE Pain Descriptors / Indicators: Constant;Pins and needles;Tingling Pain Intervention(s): Monitored during session;Other (comment) (notified RN, pt reports pain meds not helping)     Hand Dominance Right   Extremity/Trunk Assessment Upper Extremity Assessment Upper Extremity Assessment: RUE deficits/detail RUE Deficits / Details: R UE strength overall 3+/5 (significantly weaker than 5/5 L UE). coordination functional, increased pain at end range shoulder ROM. Numbness, pins & needles feeling. difficulty holding objects RUE Sensation: decreased light touch;decreased proprioception RUE Coordination: decreased fine motor   Lower Extremity Assessment Lower Extremity Assessment: Defer to PT evaluation   Cervical / Trunk Assessment Cervical /  Trunk Assessment: Normal   Communication Communication Communication: No difficulties   Cognition Arousal/Alertness: Awake/alert Behavior During Therapy: WFL for tasks assessed/performed Overall Cognitive Status: Within Functional Limits for tasks assessed                                     General Comments       Exercises     Shoulder Instructions      Home Living  Family/patient expects to be discharged to:: Private residence Living Arrangements: Children (56 y/o son) Available Help at Discharge: Family;Available PRN/intermittently Type of Home: House Home Access: Stairs to enter [employer] of Steps: 2   Home Layout: One level     Bathroom Shower/Tub: Producer, television/film/video: Standard     Home Equipment: None          Prior Functioning/Environment Level of Independence: Independent        Comments: was working full time in Set [employer], completely independent though increasing difficulty with daily tasks due to R UE deficits. Eventually performing job became difficult        OT Problem List: Decreased strength;Decreased range of motion;Decreased coordination;Impaired UE functional use;Impaired sensation      OT Treatment/Interventions: Self-care/ADL training;Therapeutic exercise;Therapeutic activities    OT Goals(Current goals can be found in the care plan section) Acute Rehab OT Goals Patient Stated Goal: decrease pain, figure out what is wrong and how to fix it OT Goal Formulation: With patient Time For Goal Achievement: 04/24/21 Potential to Achieve Goals: Good  OT Frequency: Min 2X/week   Barriers to D/C:            Co-evaluation              AM-PAC OT "6 Clicks" Daily Activity     Outcome Measure Help from another person eating meals?: A Little Help from another person taking care of personal grooming?: A Little Help from another person toileting, which includes using toliet, bedpan, or urinal?: A Little Help from another person bathing (including washing, rinsing, drying)?: A Little Help from another person to put on and taking off regular upper body clothing?: A Little Help from another person to put on and taking off regular lower body clothing?: A Little 6 Click Score: 18   End of Session Nurse Communication: Mobility status;Other (comment) (reports pain meds not  helping)  Activity Tolerance: Patient tolerated treatment well Patient left: in bed;with call bell/phone within reach  OT Visit Diagnosis: Muscle weakness (generalized) (M62.81);Pain Pain - Right/Left: Right Pain - part of body: Arm                Time: 1251-1318 OT Time Calculation (min): 27 min Charges:  OT General Charges $OT Visit: 1 Visit OT Evaluation $OT Eval Low Complexity: 1 Low OT Treatments $Self Care/Home Management : 8-22 mins  Bradd Canary, OTR/L Acute Rehab Services Office: [phone]   Lorre Munroe 04/10/2021, 2:40 PM

## 2021-04-10 NOTE — Progress Notes (Signed)
04/10/21 1457  PT Visit Information  Last PT Received On 04/10/21  Assistance Needed +1  History of Present Illness 50 year old who presented to the emergency department with right upper extremity weakness and numbness for 6 months that has been progressively getting worse.  He also endorses weight loss of about 20 to 30 pounds for the last 6 months to 1 year. Pt diagnosed with transverse myelitis - further workup to determine origin of TM. Pt is s/p lumbar puncture on 6/26. 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 Eventually performing job became difficult  Communication  Communication No difficulties  Pain Assessment  Pain Assessment Faces  Faces Pain Scale 2  Pain Location RUE  Pain Descriptors / Indicators Constant;Pins and needles;Tingling  Pain Intervention(s) Monitored during session;Limited activity within patient's tolerance;Repositioned  Cognition  Arousal/Alertness Awake/alert  Behavior During Therapy WFL for tasks assessed/performed  Overall Cognitive Status Within Functional Limits for tasks assessed  Upper Extremity Assessment  Upper Extremity Assessment Defer to OT evaluation  Lower Extremity Assessment  Lower Extremity Assessment RLE deficits/detail  RLE  Deficits / Details Grossly 4/5 throughout. Decreased sensation from the knee down.  Cervical / Trunk Assessment  Cervical / Trunk Assessment Normal  Bed Mobility  Overal bed mobility Independent  Transfers  Overall transfer level Needs assistance  Equipment used None  Transfers Sit to/from Stand  Sit to Stand Supervision  General transfer comment Supervision for safety  Ambulation/Gait  Ambulation/Gait assistance Supervision  Gait Distance (Feet) 20 Feet  Assistive device None  Gait Pattern/deviations Step-through pattern;Decreased stride length  General Gait Details Pt only wanting to ambulate in the room this session. Did note mild instability of R knee throughout. Educated about using cane for increased safety during mobility tasks.  Gait velocity Decreased  Balance  Overall balance assessment Needs assistance  Sitting-balance support No upper extremity supported;Feet supported  Sitting balance-Leahy Scale Good  Standing balance support No upper extremity supported;During functional activity  Standing balance-Leahy Scale Fair  PT - End of Session  Equipment Utilized During Treatment Gait belt  Activity Tolerance Patient tolerated treatment well  Patient left in bed;with call bell/phone within reach  Nurse Communication Mobility status  PT Assessment  PT Recommendation/Assessment Patient needs continued PT services  PT Visit Diagnosis Unsteadiness on feet (R26.81)  PT Problem List Decreased strength;Decreased balance;Decreased mobility;Decreased coordination;Impaired sensation  PT Plan  PT Frequency (ACUTE ONLY) Min 3X/week  PT Treatment/Interventions (ACUTE ONLY) DME instruction;Gait training;Stair training;Functional mobility training;Therapeutic activities;Therapeutic exercise;Balance training;Patient/family education  AM-PAC PT "6 Clicks" Mobility Outcome Measure (Version 2)  Help needed turning from your back to your side while in a flat bed without using bedrails? 4   Help needed moving from lying on your back to sitting on the side of a flat bed without using bedrails? 4  Help needed moving to and from a bed  to a chair (including a wheelchair)? 3  Help needed standing up from a chair using your arms (e.g., wheelchair or bedside chair)? 3  Help needed to walk in hospital room? 3  Help needed climbing 3-5 steps with a railing?  3  6 Click Score 20  Consider Recommendation of Discharge To: Home with no services  PT Recommendation  Follow Up Recommendations Outpatient PT  PT equipment None recommended by PT  Individuals Consulted  Consulted and Agree with Results and Recommendations Patient  Acute Rehab PT Goals  Patient Stated Goal to be independent and be able to use RUE normally  PT Goal Formulation With patient  Time For Goal Achievement 04/24/21  Potential to Achieve Goals Good  PT Time Calculation  PT Start Time (ACUTE ONLY) 1455  PT Stop Time (ACUTE ONLY) 1508  PT Time Calculation (min) (ACUTE ONLY) 13 min  PT General Charges  $$ ACUTE PT VISIT 1 Visit  PT Evaluation  $PT Eval Low Complexity 1 Low  Written Expression  Dominant Hand Right   Pt admitted secondary to problem above with deficits below. Pt requiring supervision for safety for mobility within the room. Did note mild R knee instability during mobility (pt reporting he has had falls because of weakness at home). Discussed use of cane at home to increase safety. Recommending outpatient PT follow up at d/c. Will continue to follow acutely.   Farley Ly, PT, DPT  Acute Rehabilitation Services  Pager: [phone] Office: [phone]

## 2021-04-11 ENCOUNTER — Inpatient Hospital Stay (HOSPITAL_COMMUNITY): Payer: Self-pay

## 2021-04-11 ENCOUNTER — Other Ambulatory Visit (HOSPITAL_COMMUNITY): Payer: Self-pay

## 2021-04-11 LAB — VITAMIN D 25 HYDROXY (VIT D DEFICIENCY, FRACTURES): Vit D, 25-Hydroxy: 12.11 ng/mL — ABNORMAL LOW (ref 30–100)

## 2021-04-11 LAB — METHYLMALONIC ACID, SERUM: Methylmalonic Acid, Quantitative: 143 nmol/L (ref 0–378)

## 2021-04-11 MED ORDER — GABAPENTIN 300 MG PO CAPS
300.0000 mg | ORAL_CAPSULE | Freq: Three times a day (TID) | ORAL | 2 refills | Status: DC
  Filled 2021-04-11: qty 90, 30d supply, fill #0

## 2021-04-11 MED ORDER — LOSARTAN POTASSIUM 25 MG PO TABS
25.0000 mg | ORAL_TABLET | Freq: Every day | ORAL | 1 refills | Status: DC
  Filled 2021-04-11: qty 30, 30d supply, fill #0

## 2021-04-11 MED ORDER — SENNOSIDES-DOCUSATE SODIUM 8.6-50 MG PO TABS
1.0000 | ORAL_TABLET | Freq: Every evening | ORAL | 0 refills | Status: DC | PRN
  Filled 2021-04-11: qty 30, 30d supply, fill #0

## 2021-04-11 MED ORDER — AMLODIPINE BESYLATE 10 MG PO TABS
10.0000 mg | ORAL_TABLET | Freq: Every day | ORAL | 2 refills | Status: DC
  Filled 2021-04-11: qty 30, 30d supply, fill #0

## 2021-04-11 MED ORDER — TRAMADOL HCL 50 MG PO TABS
50.0000 mg | ORAL_TABLET | Freq: Four times a day (QID) | ORAL | 0 refills | Status: DC | PRN
  Filled 2021-04-11: qty 30, 8d supply, fill #0

## 2021-04-11 MED ORDER — THIAMINE HCL 100 MG PO TABS
100.0000 mg | ORAL_TABLET | Freq: Every day | ORAL | 1 refills | Status: DC
  Filled 2021-04-11: qty 30, 30d supply, fill #0

## 2021-04-11 MED ORDER — CYANOCOBALAMIN 1000 MCG PO TABS
1000.0000 ug | ORAL_TABLET | Freq: Every day | ORAL | 1 refills | Status: DC
  Filled 2021-04-11: qty 30, 30d supply, fill #0

## 2021-04-11 NOTE — Discharge Summary (Signed)
Physician Discharge Summary  Willey Due JJO:[id number] DOB: Aug 14, 1971 DOA: 04/06/2021  PCP: Arnette Felts, FNP  Admit date: 04/06/2021 Discharge date: 04/11/2021  Admitted From: Home  Disposition:  Home   Recommendations for Outpatient Follow-up:  Follow up with PCP in 1-2 weeks Please obtain BMP/CBC in one week Please follow up on the following pending results: vitamin D level.  Needs to follow up with GI for further evaluation of weight loss, he will need colonoscopy/endoscopy screening.  Needs to follow up with neurology for EMG. And further evaluation of right arm weakness and numbness.  Out patient evaluation for Depression.   Home Health: None  Discharge Condition:Stable.  CODE STATUS: Full Code Diet recommendation: Heart Healthy   Brief/Interim Summary: 50 year old with past medical history significant for hypertension, tobacco dependence who presented to the emergency department with right upper extremity weakness and numbness for 6 months that has been progressively getting worse.  He also endorses weight loss of about 20 to 30 pounds for the last 6 months to 50 years.    1-Right arm weakness and numbness: -Differential diagnosis transverse myelitis versus autoimmune.  -neurosarcoidosis less likely  imagine not consistent with neurosarcoidosis and ACE level normal.  -CSF initially grew GPC, repeated cultures no growth.  Initial GPC likely a contaminant.  ID was consulted and recommended discontinuation of IV antibiotics. -He was a started on high-dose IV steroids, received 5 days.  -Appreciate neurology evaluation. -RF negative, Anti double strand DNA negative,  RPR negative, -VDRL CSF non reactive. ANA negative. HSV negative, Neuromyelitis optica negative, Double strand DNA negative.  -B 12; 290. Started on supplement. -IgG index; negative. Oligoclonal band pending. CMV DNA pending. -he will need neurology follow up for further evaluation of chronic weakness and numbness.  He will need EMG.    Unintentional weight loss: Unclear etiology. HIV; negative TSH normal Will need outpatient evaluation Quantiferon  negative. Chest x ray normal.  He denies diarrhea.  Plan to refer to GI for further evaluation. Patient will need screening colonoscopy.    Hypertension: Continue with amlodipine, started on Cozaar.   Tobacco dependence; declined nicotine patch.  Counseled.      Discharge Diagnoses:  Principal Problem:   Right sided weakness Active Problems:   HTN (hypertension)   Tobacco abuse   CSF abnormal   Weight loss    Discharge Instructions  Discharge Instructions     Ambulatory referral to Neurology   Complete by: As directed    An appointment is requested in approximately: 1 week   Diet - low sodium heart healthy   Complete by: As directed    Increase activity slowly   Complete by: As directed       Allergies as of 04/11/2021   No Known Allergies      Medication List     STOP taking these medications    predniSONE 10 MG tablet Commonly known as: DELTASONE   tiZANidine 4 MG tablet Commonly known as: Zanaflex       TAKE these medications    amLODipine 10 MG tablet Commonly known as: NORVASC Take 1 tablet (10 mg total) by mouth daily. Start taking on: April 12, 2021 What changed:  medication strength how much to take   gabapentin 300 MG capsule Commonly known as: Neurontin Take 1 capsule (300 mg total) by mouth 3 (three) times daily.   ibuprofen 200 MG tablet Commonly known as: ADVIL Take 200 mg by mouth every 6 (six) hours as needed for mild pain.  losartan 25 MG tablet Commonly known as: COZAAR Take 1 tablet (25 mg total) by mouth daily. Start taking on: April 12, 2021   Senexon-S 8.6-50 MG tablet Generic drug: senna-docusate Take 1 tablet by mouth at bedtime as needed for mild constipation.   thiamine 100 MG tablet Take 1 tablet (100 mg total) by mouth daily. Start taking on: April 12, 2021   traMADol 50 MG  tablet Commonly known as: Ultram Take 1 tablet (50 mg total) by mouth every 6 (six) hours as needed for moderate pain.   vitamin B-12 1000 MCG tablet Commonly known as: CYANOCOBALAMIN Take 1 tablet (1,000 mcg total) by mouth daily. Start taking on: April 12, 2021        Follow-up Information     Arnette FeltsMoore, Janece, FNP Follow up.   Specialty: General Practice Why: [facility] Contact information: [address] [place] [address] [phone]         Jenel Lucksunningham, Scott E, MD Follow up in 1 week(s).   Specialty: Gastroenterology Why: July 6 at 3;00pm./ Contact information: [address] [place] [phone]                No Known Allergies  Consultations: Neurology    Procedures/Studies: DG Chest 2 View  Result Date: 04/11/2021 CLINICAL DATA:  Numbness and tingling in the right arm and hand for approximately 6 months. EXAM: CHEST - 2 VIEW COMPARISON:  PA and lateral chest 11/21/2007. FINDINGS: Lungs clear. Heart size normal. No pneumothorax or pleural fluid. No acute or focal bony abnormality IMPRESSION: Negative chest. Electronically Signed   By: Drusilla Kannerhomas  Dalessio M.D.   On: 04/11/2021 10:49   MR BRAIN WO CONTRAST  Result Date: 04/06/2021 CLINICAL DATA:  Neuro deficit, acute stroke suspected. EXAM: MRI HEAD WITHOUT CONTRAST TECHNIQUE: Multiplanar, multiecho pulse sequences of the brain and surrounding structures were obtained without intravenous contrast. COMPARISON:  CT head September 06, 2006. FINDINGS: Brain: No acute infarction, acute hemorrhage, hydrocephalus, extra-axial collection or mass lesion. Moderate scattered T2/FLAIR hyperintensities in the white matter, which are age advanced in number. Vascular: Major arterial flow voids are maintained at the skull base. Skull and upper cervical spine: Normal marrow signal. Sinuses/Orbits: Mild ethmoid air cell and inferior maxillary sinus mucosal thickening.  Unremarkable orbits. Other: Trace bilateral mastoid fluid. IMPRESSION: 1. No evidence of acute intracranial abnormality. Specifically, no acute infarct. 2. Moderate T2/FLAIR hyperintensities within the white matter, which are age-advanced in number. This finding is nonspecific but can be seen in the setting of chronic microvascular ischemia, a demyelinating process such as multiple sclerosis, chronic migraines, or as the sequelae of a prior infectious or inflammatory process. Electronically Signed   By: Feliberto HartsFrederick S Jones MD   On: 04/06/2021 16:40   MR BRAIN W CONTRAST  Result Date: 04/07/2021 CLINICAL DATA:  Brain mass or lesion. EXAM: MRI HEAD WITH CONTRAST TECHNIQUE: Multiplanar, multiecho pulse sequences of the brain and surrounding structures were obtained with intravenous contrast. CONTRAST:  6mL GADAVIST GADOBUTROL 1 MMOL/ML IV SOLN COMPARISON:  None. FINDINGS: Brain: No abnormal intracranial enhancement Vascular: Normal vascular enhancements. Skull and upper cervical spine: No evidence of bone lesion IMPRESSION: No abnormal enhancement or mass. Electronically Signed   By: Marnee SpringJonathon  Watts M.D.   On: 04/07/2021 05:42   MR CERVICAL SPINE W WO CONTRAST  Result Date: 04/06/2021 CLINICAL DATA:  Chronic right arm pain, numbness, and weakness, worse over the past 2 weeks. EXAM: MRI CERVICAL AND THORACIC  SPINE WITHOUT AND WITH CONTRAST TECHNIQUE: Multiplanar and multiecho pulse sequences of the cervical spine, to include the craniocervical junction and cervicothoracic junction, and the thoracic spine, were obtained without and with intravenous contrast. CONTRAST:  6mL GADAVIST GADOBUTROL 1 MMOL/ML IV SOLN COMPARISON:  Thoracic spine x-rays dated November 21, 2007. FINDINGS: MRI CERVICAL SPINE FINDINGS Alignment: Straightening of the normal cervical lordosis. No listhesis. Vertebrae: No fracture, evidence of discitis, or bone lesion. Scattered degenerative endplate marrow changes. Cord: There is patchy increased  T2 signal mostly within the dorsal cord from C3 to C6-C7, but also involving the right and left hemicords at C5-C6. Some but not all of these areas demonstrate mild enhancement. Posterior Fossa, vertebral arteries, paraspinal tissues: Negative. Disc levels: C2-C3:  Negative. C3-C4: Negative disc. Left greater than right uncovertebral hypertrophy. Severe left and moderate right neuroforaminal stenosis. No spinal canal stenosis. C4-C5: Small circumferential disc osteophyte complex. Right greater than left uncovertebral hypertrophy. Mild spinal canal stenosis. Severe bilateral neuroforaminal stenosis. C5-C6: Mild disc bulging. Right uncovertebral hypertrophy. Mild spinal canal stenosis. Mild to moderate right neuroforaminal stenosis. No left neuroforaminal stenosis. C6-C7: Mild disc bulging. Bilateral uncovertebral hypertrophy. Moderate bilateral neuroforaminal stenosis. No spinal canal stenosis. C7-T1: Mild disc bulging. Mild bilateral neuroforaminal stenosis. No spinal canal stenosis. MRI THORACIC SPINE FINDINGS Alignment:  Physiologic. Vertebrae: No fracture, evidence of discitis, or bone lesion. Cord:  Normal signal and morphology.  No intradural enhancement. Paraspinal and other soft tissues: Negative. Disc levels: Disc heights and hydration are preserved. No significant disc bulge or herniation. No stenosis. IMPRESSION: Cervical spine: 1. Patchy increased T2 signal mostly within the dorsal cervical cord from C3 to C6-C7, but also involving the right and left hemicords at C5-C6. Some but not all of these areas demonstrate mild enhancement. Differential considerations include transverse myelitis, ADEM, and subacute combined degeneration. 2. Multilevel cervical spondylosis as described above. Severe neuroforaminal stenosis on the left at C3-C4 bilaterally at C4-C5. Thoracic spine: 1. Normal MRI of the thoracic spine. Electronically Signed   By: Obie Dredge M.D.   On: 04/06/2021 16:45   MR THORACIC SPINE W WO  CONTRAST  Result Date: 04/06/2021 CLINICAL DATA:  Chronic right arm pain, numbness, and weakness, worse over the past 2 weeks. EXAM: MRI CERVICAL AND THORACIC SPINE WITHOUT AND WITH CONTRAST TECHNIQUE: Multiplanar and multiecho pulse sequences of the cervical spine, to include the craniocervical junction and cervicothoracic junction, and the thoracic spine, were obtained without and with intravenous contrast. CONTRAST:  6mL GADAVIST GADOBUTROL 1 MMOL/ML IV SOLN COMPARISON:  Thoracic spine x-rays dated November 21, 2007. FINDINGS: MRI CERVICAL SPINE FINDINGS Alignment: Straightening of the normal cervical lordosis. No listhesis. Vertebrae: No fracture, evidence of discitis, or bone lesion. Scattered degenerative endplate marrow changes. Cord: There is patchy increased T2 signal mostly within the dorsal cord from C3 to C6-C7, but also involving the right and left hemicords at C5-C6. Some but not all of these areas demonstrate mild enhancement. Posterior Fossa, vertebral arteries, paraspinal tissues: Negative. Disc levels: C2-C3:  Negative. C3-C4: Negative disc. Left greater than right uncovertebral hypertrophy. Severe left and moderate right neuroforaminal stenosis. No spinal canal stenosis. C4-C5: Small circumferential disc osteophyte complex. Right greater than left uncovertebral hypertrophy. Mild spinal canal stenosis. Severe bilateral neuroforaminal stenosis. C5-C6: Mild disc bulging. Right uncovertebral hypertrophy. Mild spinal canal stenosis. Mild to moderate right neuroforaminal stenosis. No left neuroforaminal stenosis. C6-C7: Mild disc bulging. Bilateral uncovertebral hypertrophy. Moderate bilateral neuroforaminal stenosis. No spinal canal stenosis. C7-T1: Mild disc bulging. Mild bilateral neuroforaminal stenosis.  No spinal canal stenosis. MRI THORACIC SPINE FINDINGS Alignment:  Physiologic. Vertebrae: No fracture, evidence of discitis, or bone lesion. Cord:  Normal signal and morphology.  No intradural  enhancement. Paraspinal and other soft tissues: Negative. Disc levels: Disc heights and hydration are preserved. No significant disc bulge or herniation. No stenosis. IMPRESSION: Cervical spine: 1. Patchy increased T2 signal mostly within the dorsal cervical cord from C3 to C6-C7, but also involving the right and left hemicords at C5-C6. Some but not all of these areas demonstrate mild enhancement. Differential considerations include transverse myelitis, ADEM, and subacute combined degeneration. 2. Multilevel cervical spondylosis as described above. Severe neuroforaminal stenosis on the left at C3-C4 bilaterally at C4-C5. Thoracic spine: 1. Normal MRI of the thoracic spine. Electronically Signed   By: Obie Dredge M.D.   On: 04/06/2021 16:45     Subjective: He is alert, he is dressed because we don't have cause of numbness and weakness and he has been in the hospital.    Discharge Exam: Vitals:   04/11/21 0516 04/11/21 1130  BP: (!) 142/98 (!) 149/95  Pulse: 63 73  Resp: 18 18  Temp: 98.5 F (36.9 C) 97.6 F (36.4 C)  SpO2: 100% 96%     General: Pt is alert, awake, not in acute distress Cardiovascular: RRR, S1/S2 +, no rubs, no gallops Respiratory: CTA bilaterally, no wheezing, no rhonchi Abdominal: Soft, NT, ND, bowel sounds + Extremities: no edema, no cyanosis    The results of significant diagnostics from this hospitalization (including imaging, microbiology, ancillary and laboratory) are listed below for reference.     Microbiology: Recent Results (from the past 240 hour(s))  SARS CORONAVIRUS 2 (TAT 6-24 HRS) Nasopharyngeal Nasopharyngeal Swab     Status: None   Collection Time: 04/06/21  7:02 PM   Specimen: Nasopharyngeal Swab  Result Value Ref Range Status   SARS Coronavirus 2 NEGATIVE NEGATIVE Final    Comment: (NOTE) SARS-CoV-2 target nucleic acids are NOT DETECTED.  The SARS-CoV-2 RNA is generally detectable in upper and lower respiratory specimens during the  acute phase of infection. Negative results do not preclude SARS-CoV-2 infection, do not rule out co-infections with other pathogens, and should not be used as the sole basis for treatment or other patient management decisions. Negative results must be combined with clinical observations, patient history, and epidemiological information. The expected result is Negative.  Fact Sheet for Patients: HairSlick.no  Fact Sheet for Healthcare Providers: quierodirigir.com  This test is not yet approved or cleared by the [place] FDA and  has been authorized for detection and/or diagnosis of SARS-CoV-2 by FDA under an Emergency Use Authorization (EUA). This EUA will remain  in effect (meaning this test can be used) for the duration of the COVID-19 declaration under Se ction 564(b)(1) of the Act, 21 U.S.C. section 360bbb-3(b)(1), unless the authorization is terminated or revoked sooner.  Performed at [facility], [address], [place], [place] [address]   CSF culture w Stat Gram Stain     Status: None   Collection Time: 04/06/21  8:32 PM   Specimen: CSF; Cerebrospinal Fluid  Result Value Ref Range Status   Specimen Description CSF  Final   Special Requests NONE  Final   Gram Stain   Final    WBC PRESENT, PREDOMINANTLY MONONUCLEAR CRITICAL RESULT CALLED TO, READ BACK BY AND VERIFIED WITH: ELKE K. BY MESSAN H. AT 2210 ON 6 26 2022 CYTOSPIN SMEAR CORRECTED RESULTS PREVIOUSLY REPORTED AS: GRAM POSITIVE COCCI IN  CHAINS CORRECTED RESULTS CALLED TO: DR V VU [id number] AT 1104 BY CM    Culture   Final    NO GROWTH 3 DAYS Performed at [facility], [address], [place], [place] [address]    Report Status 04/10/2021 FINAL  Final     Labs: BNP (last 3 results) No results for input(s): BNP in the last 8760 hours. Basic Metabolic Panel: Recent Labs  Lab 04/06/21 1345 04/08/21 0352 04/09/21 0347  NA 136 134* 136  K  4.3 3.9 4.3  CL 105 101 104  CO2 25 24 27   GLUCOSE 93 156* 120*  BUN 9 9 9   CREATININE 0.68 0.86 0.75  CALCIUM 9.0 9.6 9.7   Liver Function Tests: Recent Labs  Lab 04/06/21 2005 04/07/21 1111 04/08/21 0352  AST  --  18 16  ALT  --  17 18  ALKPHOS  --  57 55  BILITOT  --  1.0 1.2  PROT  --  6.5 6.8  ALBUMIN 4.4 4.0 4.0   No results for input(s): LIPASE, AMYLASE in the last 168 hours. No results for input(s): AMMONIA in the last 168 hours. CBC: Recent Labs  Lab 04/06/21 1345 04/08/21 0352  WBC 5.2 6.2  NEUTROABS  --  5.7  HGB 13.7 14.3  HCT 40.8 42.1  MCV 96.7 95.2  PLT 288 289   Cardiac Enzymes: No results for input(s): CKTOTAL, CKMB, CKMBINDEX, TROPONINI in the last 168 hours. BNP: Invalid input(s): POCBNP CBG: No results for input(s): GLUCAP in the last 168 hours. D-Dimer No results for input(s): DDIMER in the last 72 hours. Hgb A1c No results for input(s): HGBA1C in the last 72 hours. Lipid Profile No results for input(s): CHOL, HDL, LDLCALC, TRIG, CHOLHDL, LDLDIRECT in the last 72 hours. Thyroid function studies No results for input(s): TSH, T4TOTAL, T3FREE, THYROIDAB in the last 72 hours.  Invalid input(s): FREET3 Anemia work up No results for input(s): VITAMINB12, FOLATE, FERRITIN, TIBC, IRON, RETICCTPCT in the last 72 hours. Urinalysis    Component Value Date/Time   COLORURINE YELLOW 01/05/2020 0430   APPEARANCEUR CLOUDY (A) 01/05/2020 0430   LABSPEC 1.020 01/05/2020 0430   PHURINE 7.0 01/05/2020 0430   GLUCOSEU NEGATIVE 01/05/2020 0430   HGBUR SMALL (A) 01/05/2020 0430   BILIRUBINUR NEGATIVE 01/05/2020 0430   BILIRUBINUR negative 05/31/2019 1523   KETONESUR NEGATIVE 01/05/2020 0430   PROTEINUR NEGATIVE 01/05/2020 0430   UROBILINOGEN 0.2 05/31/2019 1523   NITRITE NEGATIVE 01/05/2020 0430   LEUKOCYTESUR NEGATIVE 01/05/2020 0430   Sepsis Labs Invalid input(s): PROCALCITONIN,  WBC,  LACTICIDVEN Microbiology Recent Results (from the past 240  hour(s))  SARS CORONAVIRUS 2 (TAT 6-24 HRS) Nasopharyngeal Nasopharyngeal Swab     Status: None   Collection Time: 04/06/21  7:02 PM   Specimen: Nasopharyngeal Swab  Result Value Ref Range Status   SARS Coronavirus 2 NEGATIVE NEGATIVE Final    Comment: (NOTE) SARS-CoV-2 target nucleic acids are NOT DETECTED.  The SARS-CoV-2 RNA is generally detectable in upper and lower respiratory specimens during the acute phase of infection. Negative results do not preclude SARS-CoV-2 infection, do not rule out co-infections with other pathogens, and should not be used as the sole basis for treatment or other patient management decisions. Negative results must be combined with clinical observations, patient history, and epidemiological information. The expected result is Negative.  Fact Sheet for Patients: 01/07/2020  Fact Sheet for Healthcare Providers: 04/08/21  This test is not yet approved or cleared by the HairSlick.no  States FDA and  has been authorized for detection and/or diagnosis of SARS-CoV-2 by FDA under an Emergency Use Authorization (EUA). This EUA will remain  in effect (meaning this test can be used) for the duration of the COVID-19 declaration under Se ction 564(b)(1) of the Act, 21 U.S.C. section 360bbb-3(b)(1), unless the authorization is terminated or revoked sooner.  Performed at [facility], [address], [place], [place] [address]   CSF culture w Stat Gram Stain     Status: None   Collection Time: 04/06/21  8:32 PM   Specimen: CSF; Cerebrospinal Fluid  Result Value Ref Range Status   Specimen Description CSF  Final   Special Requests NONE  Final   Gram Stain   Final    WBC PRESENT, PREDOMINANTLY MONONUCLEAR CRITICAL RESULT CALLED TO, READ BACK BY AND VERIFIED WITH: ELKE K. BY MESSAN H. AT 2210 ON 6 26 2022 CYTOSPIN SMEAR CORRECTED RESULTS PREVIOUSLY REPORTED AS: GRAM POSITIVE COCCI IN  CHAINS CORRECTED RESULTS CALLED TO: DR V VU [id number] AT 1104 BY CM    Culture   Final    NO GROWTH 3 DAYS Performed at [facility], [address] [place], [place] [address]    Report Status 04/10/2021 FINAL  Final     Time coordinating discharge: 40 minutes  SIGNED:   Alba Cory, MD  [facility]

## 2021-04-11 NOTE — Progress Notes (Signed)
Pt  informed earlier starting shift pt feeling depressed. Pt denies any suicidal ideation, denies harm to self or others. MD aware was paged stated will let AM taff know for follow up. Will continue to monitor pt.Marland Kitchen

## 2021-04-11 NOTE — Plan of Care (Signed)
  Problem: Education: Goal: Knowledge of General Education information will improve Description Including pain rating scale, medication(s)/side effects and non-pharmacologic comfort measures Outcome: Progressing   

## 2021-04-12 LAB — MISC LABCORP TEST (SEND OUT): Labcorp test code: 138289

## 2021-04-14 LAB — CMV DNA BY PCR, QUALITATIVE: CMV DNA, Qual PCR: NEGATIVE

## 2021-04-15 ENCOUNTER — Telehealth: Payer: Self-pay

## 2021-04-15 NOTE — Telephone Encounter (Signed)
Transition Care Management Unsuccessful Follow-up Telephone Call  Date of discharge and from where:  04/06/2021 [facility].  Attempts:  1st Attempt  Reason for unsuccessful TCM follow-up call:  mailbox full.

## 2021-04-16 ENCOUNTER — Telehealth: Payer: Self-pay

## 2021-04-16 ENCOUNTER — Encounter: Payer: Self-pay | Admitting: Neurology

## 2021-04-16 ENCOUNTER — Ambulatory Visit (INDEPENDENT_AMBULATORY_CARE_PROVIDER_SITE_OTHER): Payer: Self-pay | Admitting: Gastroenterology

## 2021-04-16 ENCOUNTER — Encounter: Payer: Self-pay | Admitting: Gastroenterology

## 2021-04-16 VITALS — BP 140/82 | HR 97 | Ht 70.0 in | Wt 130.4 lb

## 2021-04-16 DIAGNOSIS — R202 Paresthesia of skin: Secondary | ICD-10-CM

## 2021-04-16 DIAGNOSIS — R634 Abnormal weight loss: Secondary | ICD-10-CM

## 2021-04-16 DIAGNOSIS — M79609 Pain in unspecified limb: Secondary | ICD-10-CM

## 2021-04-16 LAB — ANTINUCLEAR ANTIBODIES, IFA: ANA Ab, IFA: NEGATIVE

## 2021-04-16 NOTE — Patient Instructions (Addendum)
If you are age 51 or older, your body mass index should be between 23-30. Your Body mass index is 18.71 kg/m. If this is out of the aforementioned range listed, please consider follow up with your Primary Care Provider.  If you are age 75 or younger, your body mass index should be between 19-25. Your Body mass index is 18.71 kg/m. If this is out of the aformentioned range listed, please consider follow up with your Primary Care Provider.   You have been scheduled for an endoscopy and colonoscopy. Please follow the written instructions given to you at your visit today. Please pick up your prep supplies at the pharmacy within the next 1-3 days. If you use inhalers (even only as needed), please bring them with you on the day of your procedure.   Due to recent changes in healthcare laws, you may see the results of your imaging and laboratory studies on MyChart before your provider has had a chance to review them.  We understand that in some cases there may be results that are confusing or concerning to you. Not all laboratory results come back in the same time frame and the provider may be waiting for multiple results in order to interpret others.  Please give [place] 48 hours in order for your provider to thoroughly review all the results before contacting the office for clarification of your results.    The [place] [facility] providers would like to encourage you to use [facility] to communicate with providers for non-urgent requests or questions.  Due to long hold times on the telephone, sending your provider a message by [facility] may be a faster and more efficient way to get a response.  Please allow 48 business hours for a response.  Please remember that this is for non-urgent requests.   It was a pleasure to see you today!  Thank you for trusting me with your gastrointestinal care!     Scott E. Tomasa Rand, MD

## 2021-04-16 NOTE — Telephone Encounter (Signed)
Transition Care Management Follow-up Telephone Call Date of discharge and from where: 04/11/2021 [facility]  How have you been since you were released from the hospital?  Pt said he feels no better, arm still feels the same.  Any questions or concerns? No  Items Reviewed: Did the pt receive and understand the discharge instructions provided? Yes  Medications obtained and verified? Yes  Other? No  Any new allergies since your discharge? No  Dietary orders reviewed? Yes Do you have support at home? Yes   Home Care and Equipment/Supplies: Were home health services ordered? not applicable If so, what is the name of the agency? N/a   Has the agency set up a time to come to the patient's home? not applicable Were any new equipment or medical supplies ordered?  No What is the name of the medical supply agency? N/a  Were you able to get the supplies/equipment? not applicable Do you have any questions related to the use of the equipment or supplies? No  Functional Questionnaire: (I = Independent and D = Dependent) ADLs: i  Bathing/Dressing- i  Meal Prep- i  Eating- i  Maintaining continence- i  Transferring/Ambulation- i  Managing Meds- i  Follow up appointments reviewed:  PCP Hospital f/u appt confirmed? Yes  Scheduled to see Arnette Felts on 04/17/2021 @ [address] [facility]. Specialist Hospital f/u appt confirmed? Yes  Scheduled to see GI Tiajuana Amass on 04/16/2021 @ 3:00PM. Are transportation arrangements needed? No  If their condition worsens, is the pt aware to call PCP or go to the Emergency Dept.? Yes Was the patient provided with contact information for the PCP's office or ED? Yes Was to pt encouraged to call back with questions or concerns? Yes

## 2021-04-17 ENCOUNTER — Telehealth: Payer: Self-pay

## 2021-04-17 ENCOUNTER — Other Ambulatory Visit: Payer: Self-pay

## 2021-04-17 ENCOUNTER — Ambulatory Visit (INDEPENDENT_AMBULATORY_CARE_PROVIDER_SITE_OTHER): Payer: Self-pay | Admitting: Nurse Practitioner

## 2021-04-17 ENCOUNTER — Encounter: Payer: Self-pay | Admitting: Nurse Practitioner

## 2021-04-17 VITALS — BP 110/70 | HR 98 | Temp 98.3°F | Ht 70.0 in | Wt 128.0 lb

## 2021-04-17 DIAGNOSIS — R52 Pain, unspecified: Secondary | ICD-10-CM

## 2021-04-17 DIAGNOSIS — E559 Vitamin D deficiency, unspecified: Secondary | ICD-10-CM

## 2021-04-17 DIAGNOSIS — R200 Anesthesia of skin: Secondary | ICD-10-CM

## 2021-04-17 DIAGNOSIS — R634 Abnormal weight loss: Secondary | ICD-10-CM

## 2021-04-17 DIAGNOSIS — F3289 Other specified depressive episodes: Secondary | ICD-10-CM

## 2021-04-17 DIAGNOSIS — R202 Paresthesia of skin: Secondary | ICD-10-CM

## 2021-04-17 LAB — CBC
Hematocrit: 43.2 % (ref 37.5–51.0)
Hemoglobin: 14.3 g/dL (ref 13.0–17.7)
MCH: 31.9 pg (ref 26.6–33.0)
MCHC: 33.1 g/dL (ref 31.5–35.7)
MCV: 96 fL (ref 79–97)
Platelets: 349 10*3/uL (ref 150–450)
RBC: 4.48 x10E6/uL (ref 4.14–5.80)
RDW: 12 % (ref 11.6–15.4)
WBC: 12.5 10*3/uL — ABNORMAL HIGH (ref 3.4–10.8)

## 2021-04-17 LAB — BMP8+EGFR
BUN/Creatinine Ratio: 14 (ref 9–20)
BUN: 12 mg/dL (ref 6–24)
CO2: 26 mmol/L (ref 20–29)
Calcium: 9.9 mg/dL (ref 8.7–10.2)
Chloride: 96 mmol/L (ref 96–106)
Creatinine, Ser: 0.86 mg/dL (ref 0.76–1.27)
Glucose: 80 mg/dL (ref 65–99)
Potassium: 5.1 mmol/L (ref 3.5–5.2)
Sodium: 136 mmol/L (ref 134–144)
eGFR: 105 mL/min/{1.73_m2} (ref >59)

## 2021-04-17 MED ORDER — PREGABALIN 25 MG PO CAPS: 25.0000 mg | ORAL_CAPSULE | Freq: Two times a day (BID) | ORAL | 2 refills | Status: DC

## 2021-04-17 MED ORDER — VITAMIN D (ERGOCALCIFEROL) 1.25 MG (50000 UNIT) PO CAPS: 50000.0000 [IU] | ORAL_CAPSULE | ORAL | 1 refills | Status: DC

## 2021-04-17 MED ORDER — CITALOPRAM HYDROBROMIDE 10 MG PO TABS: 10.0000 mg | ORAL_TABLET | Freq: Every day | ORAL | 2 refills | Status: DC

## 2021-04-17 MED ORDER — GABAPENTIN 300 MG PO CAPS: ORAL_CAPSULE | ORAL | 2 refills | Status: DC

## 2021-04-17 NOTE — Patient Instructions (Addendum)
DSS number for Medicaid - [phone]  [email or web address]_[email or web address]_[email or web address]_[email or web address]_[email or web address] [email or web address].   This link is for pregabalin  Stop taking gabapentin, you do not want to take both pregabalin and gabapentin together.   Start citalopram for your mood take in the morning. If you have different thoughts call office or go to ER.   Also, call your human resources to have them to fax an FMLA form for completion - there is a $25 fee to complete

## 2021-04-17 NOTE — Addendum Note (Signed)
Addended by: Justice Britain on: 04/17/2021 03:05 PM   Modules accepted: Orders

## 2021-04-17 NOTE — Telephone Encounter (Signed)
Called patient and let him know that he had been scheduled for a CT abdomen pelvis on 04/23/21 @ 2 pm. He needs to arrive 15 minutes early and could come by the office to pick contrast Friday or Monday before his CT.

## 2021-04-17 NOTE — Progress Notes (Signed)
HPI: Mr. Yu is a pleasant 50 year old male referred to gastroenterology by Arnette Felts, FNP for further evaluation of unexplained weight loss.  At the same time he has had weight loss, he has also been dealing with significant right arm pain, paresthesias and loss of function of the right upper extremity.  The patient denies any GI symptoms to include abdominal pain, nausea, vomiting, change in appetite or diet, diarrhea, change in bowel habits or stool appearance.  He has regular bowel movements daily with formed brown stool. Patient's normal weight has been between 158 and 160 pounds for many years.  His weight started declining about 6 months ago and nadired at 123 pounds.  He was admitted to the hospital for an extensive work-up and was empirically treated with steroids did not help his neurologic symptoms but which did help him gained about 7 pounds. He had extensive imaging and laboratory evaluation while in the hospital, but a cause of his right arm pain and numbness still unclear at this time.  He has been referred for outpatient neurology follow-up and EMG testing.   Past Medical History:  Diagnosis Date   Arthritis    right hip   Hypertension    Low back pain    right side    History reviewed. No pertinent surgical history.  Outpatient Medications Prior to Visit  Medication Sig Dispense Refill   amLODipine (NORVASC) 10 MG tablet Take 1 tablet (10 mg total) by mouth daily. 30 tablet 2   cyanocobalamin 1000 MCG tablet Take 1 tablet (1,000 mcg total) by mouth daily. 30 tablet 1   losartan (COZAAR) 25 MG tablet Take 1 tablet (25 mg total) by mouth daily. 30 tablet 1   thiamine 100 MG tablet Take 1 tablet (100 mg total) by mouth daily. 30 tablet 1   traMADol (ULTRAM) 50 MG tablet Take 1 tablet (50 mg total) by mouth every 6 (six) hours as needed for moderate pain. 30 tablet 0   gabapentin (NEURONTIN) 300 MG capsule Take 1 capsule (300 mg total) by mouth 3 (three) times daily. 90  capsule 2   ibuprofen (ADVIL) 200 MG tablet Take 200 mg by mouth every 6 (six) hours as needed for mild pain. (Patient not taking: Reported on 04/16/2021)     senna-docusate (SENOKOT-S) 8.6-50 MG tablet Take 1 tablet by mouth at bedtime as needed for mild constipation. (Patient not taking: Reported on 04/16/2021) 30 tablet 0   No facility-administered medications prior to visit.    No Known Allergies  Family History  Problem Relation Age of Onset   Hypertension Mother    Diabetes Mother    Hypertension Father     Social History   Tobacco Use   Smoking status: Every Day    Packs/day: 1.00    Pack years: 0.00    Types: Cigarettes   Smokeless tobacco: Never   Tobacco comments:    07/22/20 1 pk a week   Vaping Use   Vaping Use: Never used  Substance Use Topics   Alcohol use: Yes    Comment: weekends only   Drug use: No    ROS: As per history of present illness, otherwise negative  BP 140/82   Pulse 97   Ht 5\' 10"  (1.778 m)   Wt 130 lb 6.4 oz (59.1 kg)   SpO2 99%   BMI 18.71 kg/m  Constitutional: Well-developed and well-nourished. No distress. HEENT: Normocephalic and atraumatic. Oropharynx is clear and moist.  No scleral icterus. Cardiovascular: Normal rate,  regular rhythm and intact distal pulses. No M/R/G Pulmonary/chest: Effort normal and breath sounds normal. No wheezing, rales or rhonchi. Abdominal: Soft, nontender, nondistended. Bowel sounds active throughout. There are no masses palpable. No hepatosplenomegaly. Extremities: no clubbing, cyanosis, or edema Neurological: Alert and oriented to person place and time.  Right hand partially contracted in resting position, patient able to partially open hand but quickly returns to contracted position.  The right and left arm appear grossly symmetrical without evidence of muscle atrophy on the right Skin: Skin is warm and dry.  Psychiatric: Normal mood and affect. Behavior is normal.  RELEVANT LABS AND IMAGING: CBC     Component Value Date/Time   WBC 6.2 04/08/2021 0352   RBC 4.42 04/08/2021 0352   HGB 14.3 04/08/2021 0352   HGB 13.9 05/16/2020 1725   HCT 42.1 04/08/2021 0352   HCT 41.1 05/16/2020 1725   PLT 289 04/08/2021 0352   PLT 287 05/16/2020 1725   MCV 95.2 04/08/2021 0352   MCV 95 05/16/2020 1725   MCH 32.4 04/08/2021 0352   MCHC 34.0 04/08/2021 0352   RDW 12.0 04/08/2021 0352   RDW 12.0 05/16/2020 1725   LYMPHSABS 0.4 (L) 04/08/2021 0352   LYMPHSABS 1.8 05/31/2019 1521   MONOABS 0.0 (L) 04/08/2021 0352   EOSABS 0.0 04/08/2021 0352   EOSABS 0.1 05/31/2019 1521   BASOSABS 0.0 04/08/2021 0352   BASOSABS 0.1 05/31/2019 1521    CMP     Component Value Date/Time   NA 136 04/09/2021 0347   NA 138 05/16/2020 1725   K 4.3 04/09/2021 0347   CL 104 04/09/2021 0347   CO2 27 04/09/2021 0347   GLUCOSE 120 (H) 04/09/2021 0347   BUN 9 04/09/2021 0347   BUN 11 05/16/2020 1725   CREATININE 0.75 04/09/2021 0347   CALCIUM 9.7 04/09/2021 0347   PROT 6.8 04/08/2021 0352   PROT 7.0 05/16/2020 1725   ALBUMIN 4.0 04/08/2021 0352   ALBUMIN 4.4 04/06/2021 2005   AST 16 04/08/2021 0352   ALT 18 04/08/2021 0352   ALKPHOS 55 04/08/2021 0352   BILITOT 1.2 04/08/2021 0352   BILITOT 0.6 05/16/2020 1725   GFRNONAA >60 04/09/2021 0347   GFRAA 124 05/16/2020 1725   CLINICAL DATA:  Brain mass or lesion.   EXAM: MRI HEAD WITH CONTRAST   TECHNIQUE: Multiplanar, multiecho pulse sequences of the brain and surrounding structures were obtained with intravenous contrast.   CONTRAST:  60mL GADAVIST GADOBUTROL 1 MMOL/ML IV SOLN   COMPARISON:  None.   FINDINGS: Brain: No abnormal intracranial enhancement   Vascular: Normal vascular enhancements.   Skull and upper cervical spine: No evidence of bone lesion   IMPRESSION: No abnormal enhancement or mass.     Electronically Signed   By: Marnee Spring M.D.   On: 04/07/2021 05:42  CLINICAL DATA:  Chronic right arm pain, numbness, and  weakness, worse over the past 2 weeks.   EXAM: MRI CERVICAL AND THORACIC SPINE WITHOUT AND WITH CONTRAST   TECHNIQUE: Multiplanar and multiecho pulse sequences of the cervical spine, to include the craniocervical junction and cervicothoracic junction, and the thoracic spine, were obtained without and with intravenous contrast.   CONTRAST:  19mL GADAVIST GADOBUTROL 1 MMOL/ML IV SOLN   COMPARISON:  Thoracic spine x-rays dated November 21, 2007.   FINDINGS: MRI CERVICAL SPINE FINDINGS   Alignment: Straightening of the normal cervical lordosis. No listhesis.   Vertebrae: No fracture, evidence of discitis, or bone lesion. Scattered degenerative endplate marrow changes.  Cord: There is patchy increased T2 signal mostly within the dorsal cord from C3 to C6-C7, but also involving the right and left hemicords at C5-C6. Some but not all of these areas demonstrate mild enhancement.   Posterior Fossa, vertebral arteries, paraspinal tissues: Negative.   Disc levels:   C2-C3:  Negative.   C3-C4: Negative disc. Left greater than right uncovertebral hypertrophy. Severe left and moderate right neuroforaminal stenosis. No spinal canal stenosis.   C4-C5: Small circumferential disc osteophyte complex. Right greater than left uncovertebral hypertrophy. Mild spinal canal stenosis. Severe bilateral neuroforaminal stenosis.   C5-C6: Mild disc bulging. Right uncovertebral hypertrophy. Mild spinal canal stenosis. Mild to moderate right neuroforaminal stenosis. No left neuroforaminal stenosis.   C6-C7: Mild disc bulging. Bilateral uncovertebral hypertrophy. Moderate bilateral neuroforaminal stenosis. No spinal canal stenosis.   C7-T1: Mild disc bulging. Mild bilateral neuroforaminal stenosis. No spinal canal stenosis.   MRI THORACIC SPINE FINDINGS   Alignment:  Physiologic.   Vertebrae: No fracture, evidence of discitis, or bone lesion.   Cord:  Normal signal and morphology.  No  intradural enhancement.   Paraspinal and other soft tissues: Negative.   Disc levels:   Disc heights and hydration are preserved. No significant disc bulge or herniation. No stenosis.   IMPRESSION: Cervical spine:   1. Patchy increased T2 signal mostly within the dorsal cervical cord from C3 to C6-C7, but also involving the right and left hemicords at C5-C6. Some but not all of these areas demonstrate mild enhancement. Differential considerations include transverse myelitis, ADEM, and subacute combined degeneration. 2. Multilevel cervical spondylosis as described above. Severe neuroforaminal stenosis on the left at C3-C4 bilaterally at C4-C5.   Thoracic spine:   1. Normal MRI of the thoracic spine.     Electronically Signed   By: Obie Dredge M.D.   On: 04/06/2021 16:45    ASSESSMENT/PLAN: 50 year old male with 6 months of unexplained weight loss (160 pounds down to 130 pounds) with concomitant unexplained persistent severe right upper extremity pain with paresthesias and loss of limb function.  He does not have any GI symptoms to suggest an organic gastrointestinal disease process contributing to his weight loss (Crohn's disease, protein-losing enteropathy, celiac disease, etc.).  CNS imaging was suggestive of a transverse myelitis versus SCAD, unclear if this would cause such significant weight loss. Although a GI malignancy is certainly in the differential for unexplained weight loss, it would be unlikely to explain his neurologic symptoms.  It seems unlikely that his weight loss and neurologic symptoms would be related to 2 separate entities. We will schedule the patient for an expedited upper and lower endoscopy to exclude GI malignancy, but the patient should have close follow-up with neurology to further define and treat his neurologic symptoms. In the meantime, I encouraged him to supplement his diet with protein shakes and calorie dense foods such as milkshakes to help  curb his weight loss. As he has also not had any dedicated cross-sectional imaging of his chest abdomen or pelvis, and given his history of smoking, I recommend we obtain a contrast-enhanced CT of the chest abdomen pelvis  The risks (including bleeding, perforation, infection, missed lesions, medication reactions and possible hospitalization or surgery if complications occur), benefits, and alternatives to EGD/colonoscopy with possible biopsy and possible polypectomy were discussed with the patient and he consents to proceed.    Plan - Expedited EGD/colonoscopy -CT c/a/p with IV/PO contrast -Follow-up with neurology    Cc:Arnette Felts, Fnp [address] [place],  [place] [id number]

## 2021-04-17 NOTE — Progress Notes (Signed)
I,Pierra Skora,acting as a Education administrator for [name], FNP.,have documented all relevant documentation on the behalf of [name], FNP,as directed by  [name], FNP while in the presence of [name], [place].   This visit occurred during the SARS-CoV-2 public health emergency.  Safety protocols were in place, including screening questions prior to the visit, additional usage of staff PPE, and extensive cleaning of exam room while observing appropriate contact time as indicated for disinfecting solutions.  Subjective:     Patient ID: Connor Andrews , male    DOB: 1971-07-14 , 50 y.o.   MRN: [id number]   Chief Complaint  Patient presents with   Hospitalization Follow-up    HPI  Pt is here today for a hospital follow up. Pt is still experiencing numbness and tingling in right hand. He said this has been ongoing for 6 months. His whole right side he said is practically shut down, he can not write at the moment, this problem also keeps him from working. Pt was admitted on the 04/06/21 and discharged on 04/11/[facility]21 at [facility].  He was thought to have inflammation to his spine and was given steroids, his vitamin B12 was 290.  He will need an EMG. He had multiple test to check for unintentional weight loss which all the test were negative, he is to follow up with GI>    Wt Readings from Last 3 Encounters: 04/17/21 : 128 lb (58.1 kg) 04/16/21 : 130 lb 6.4 oz (59.1 kg) 04/06/21 : 122 lb 14.4 oz (55.7 kg)   He tried to go back to work but was unable to due to his pain. He only worked a few days before being admitted to the hospital.     Past Medical History:  Diagnosis Date   Arthritis    right hip   Hypertension    Low back pain    right side     Family History  Problem Relation Age of Onset   Hypertension Mother    Diabetes Mother    Hypertension Father      Current Outpatient Medications:    amLODipine (NORVASC) 10 MG tablet, Take 1 tablet (10 mg total) by mouth daily.,  Disp: 30 tablet, Rfl: 2   citalopram (CELEXA) 10 MG tablet, Take 1 tablet (10 mg total) by mouth daily., Disp: 30 tablet, Rfl: 2   cyanocobalamin 1000 MCG tablet, Take 1 tablet (1,000 mcg total) by mouth daily., Disp: 30 tablet, Rfl: 1   losartan (COZAAR) 25 MG tablet, Take 1 tablet (25 mg total) by mouth daily., Disp: 30 tablet, Rfl: 1   pregabalin (LYRICA) 25 MG capsule, Take 1 capsule (25 mg total) by mouth 2 (two) times daily., Disp: 60 capsule, Rfl: 2   thiamine 100 MG tablet, Take 1 tablet (100 mg total) by mouth daily., Disp: 30 tablet, Rfl: 1   traMADol (ULTRAM) 50 MG tablet, Take 1 tablet (50 mg total) by mouth every 6 (six) hours as needed for moderate pain., Disp: 30 tablet, Rfl: 0   Vitamin D, Ergocalciferol, (DRISDOL) 1.25 MG (50000 UNIT) CAPS capsule, Take 1 capsule (50,000 Units total) by mouth 2 (two) times a week., Disp: 24 capsule, Rfl: 1   No Known Allergies   Review of Systems  Constitutional: Negative.   HENT: Negative.    Eyes: Negative.   Respiratory: Negative.    Cardiovascular: Negative.  Negative for chest pain, palpitations and leg swelling.  Gastrointestinal: Negative.   Endocrine: Negative.   Genitourinary: Negative.  Musculoskeletal: Negative.   Skin: Negative.   Allergic/Immunologic: Negative.   Neurological: Negative.   Hematological: Negative.   Psychiatric/Behavioral: Negative.      Today's Vitals   04/17/21 0848  BP: 110/70  Pulse: 98  Temp: 98.3 F (36.8 C)  SpO2: 99%  Weight: 128 lb (58.1 kg)  Height: $Remove'5\' 10"'zJHYRhY$  (1.778 m)   Body mass index is 18.37 kg/m.  Wt Readings from Last 3 Encounters:  04/17/21 128 lb (58.1 kg)  04/16/21 130 lb 6.4 oz (59.1 kg)  04/06/21 122 lb 14.4 oz (55.7 kg)    Objective:  Physical Exam Vitals reviewed.  Constitutional:      General: He is not in acute distress.    Appearance: Normal appearance.  Cardiovascular:     Rate and Rhythm: Normal rate and regular rhythm.     Pulses: Normal pulses.     Heart  sounds: Normal heart sounds. No murmur heard. Neurological:     General: No focal deficit present.     Mental Status: He is alert and oriented to person, place, and time.  Psychiatric:        Mood and Affect: Mood is depressed. Affect is flat.        Behavior: Behavior normal.        Thought Content: Thought content normal.        Judgment: Judgment normal.     Depression screen [facility] 2/9 04/17/2021 07/24/2020 05/31/2019  Decreased Interest 3 0 0  Down, Depressed, Hopeless 3 0 0  PHQ - 2 Score 6 0 0  Altered sleeping 1 - 3  Tired, decreased energy 1 - 3  Change in appetite 0 - 3  Feeling bad or failure about yourself  3 - 0  Trouble concentrating 1 - 3  Moving slowly or fidgety/restless 3 - 3  Suicidal thoughts 0 - 0  PHQ-9 Score 15 - 15  Difficult doing work/chores Extremely dIfficult - Extremely dIfficult       Assessment And Plan:     1. Numbness and tingling in right hand TCM Performed. A member of the clinical team spoke with the patient upon dischare. Discharge summary was reviewed in full detail during the visit. Meds reconciled and compared to discharge meds. Medication list is updated and reviewed with the patient.  Greater than 50% face to face time was spent in counseling an coordination of care.  All questions were answered to the satisfaction of the patient.   Admitted from 6/26-7/1 at [facility] with a series of test and imaging studies.  He is to follow up with Neurology for a possible EMG - pregabalin (LYRICA) 25 MG capsule; Take 1 capsule (25 mg total) by mouth 2 (two) times daily.  Dispense: 60 capsule; Refill: 2  2. Vitamin D deficiency Will start him on 50,000 units two times a week    Also encouraged to spend 15 minutes in the sun daily.  - Vitamin D, Ergocalciferol, (DRISDOL) 1.25 MG (50000 UNIT) CAPS capsule; Take 1 capsule (50,000 Units total) by mouth 2 (two) times a week.  Dispense: 24 capsule; Refill: 1  3. Sharp pain  4. Unexplained weight loss He is  to see GI for a colonoscopy/endoscopy - BMP8+eGFR - CBC  5. Other depression Will start him on citalopram to help with his mood Discussed side effects and if has different thoughts to call to office - citalopram (CELEXA) 10 MG tablet; Take 1 tablet (10 mg total) by mouth daily.  Dispense: 30 tablet; Refill:  2    Patient was given opportunity to ask questions. Patient verbalized understanding of the plan and was able to repeat key elements of the plan. All questions were answered to their satisfaction.  [name], FNP   I, [name], FNP, have reviewed all documentation for this visit. The documentation on 04/17/21 for the exam, diagnosis, procedures, and orders are all accurate and complete.   IF YOU HAVE BEEN REFERRED TO A SPECIALIST, IT MAY TAKE 1-2 WEEKS TO SCHEDULE/PROCESS THE REFERRAL. IF YOU HAVE NOT HEARD FROM US/SPECIALIST IN TWO WEEKS, PLEASE GIVE [place] A CALL AT [phone].   THE PATIENT IS ENCOURAGED TO PRACTICE SOCIAL DISTANCING DUE TO THE COVID-19 PANDEMIC.

## 2021-04-23 ENCOUNTER — Inpatient Hospital Stay: Admission: RE | Admit: 2021-04-23 | Payer: Self-pay | Source: Ambulatory Visit

## 2021-04-25 ENCOUNTER — Encounter: Payer: Self-pay | Admitting: Nurse Practitioner

## 2021-04-29 ENCOUNTER — Inpatient Hospital Stay: Admission: RE | Admit: 2021-04-29 | Payer: Self-pay | Source: Ambulatory Visit

## 2021-05-08 ENCOUNTER — Other Ambulatory Visit: Payer: Self-pay

## 2021-05-08 ENCOUNTER — Encounter: Payer: Self-pay | Admitting: Gastroenterology

## 2021-05-08 ENCOUNTER — Ambulatory Visit (AMBULATORY_SURGERY_CENTER): Payer: Self-pay | Admitting: Gastroenterology

## 2021-05-08 VITALS — BP 107/80 | HR 70 | Temp 98.4°F | Resp 14 | Ht 70.0 in | Wt 130.0 lb

## 2021-05-08 DIAGNOSIS — M79609 Pain in unspecified limb: Secondary | ICD-10-CM

## 2021-05-08 DIAGNOSIS — R202 Paresthesia of skin: Secondary | ICD-10-CM

## 2021-05-08 DIAGNOSIS — K640 First degree hemorrhoids: Secondary | ICD-10-CM

## 2021-05-08 DIAGNOSIS — R634 Abnormal weight loss: Secondary | ICD-10-CM

## 2021-05-08 DIAGNOSIS — K5730 Diverticulosis of large intestine without perforation or abscess without bleeding: Secondary | ICD-10-CM

## 2021-05-08 MED ORDER — SODIUM CHLORIDE 0.9 % IV SOLN: 500.0000 mL | Freq: Once | INTRAVENOUS | Status: DC

## 2021-05-08 NOTE — Patient Instructions (Signed)
Repeat colonoscopy in 10 years for screening purposes.   Handouts provided on diverticulosis and hemorrhoids.   YOU HAD AN ENDOSCOPIC PROCEDURE TODAY AT THE [facility]:   Refer to the procedure report that was given to you for any specific questions about what was found during the examination.  If the procedure report does not answer your questions, please call your gastroenterologist to clarify.  If you requested that your care partner not be given the details of your procedure findings, then the procedure report has been included in a sealed envelope for you to review at your convenience later.  YOU SHOULD EXPECT: Some feelings of bloating in the abdomen. Passage of more gas than usual.  Walking can help get rid of the air that was put into your GI tract during the procedure and reduce the bloating. If you had a lower endoscopy (such as a colonoscopy or flexible sigmoidoscopy) you may notice spotting of blood in your stool or on the toilet paper. If you underwent a bowel prep for your procedure, you may not have a normal bowel movement for a few days.  Please Note:  You might notice some irritation and congestion in your nose or some drainage.  This is from the oxygen used during your procedure.  There is no need for concern and it should clear up in a day or so.  SYMPTOMS TO REPORT IMMEDIATELY:  Following lower endoscopy (colonoscopy or flexible sigmoidoscopy):  Excessive amounts of blood in the stool  Significant tenderness or worsening of abdominal pains  Swelling of the abdomen that is new, acute  Fever of 100F or higher  Following upper endoscopy (EGD)  Vomiting of blood or coffee ground material  New chest pain or pain under the shoulder blades  Painful or persistently difficult swallowing  New shortness of breath  Fever of 100F or higher  Black, tarry-looking stools  For urgent or emergent issues, a gastroenterologist can be reached at any hour by calling [phone]  [phone]. Do not use MyChart messaging for urgent concerns.    DIET:  We do recommend a small meal at first, but then you may proceed to your regular diet.  Drink plenty of fluids but you should avoid alcoholic beverages for 24 hours.  ACTIVITY:  You should plan to take it easy for the rest of today and you should NOT DRIVE or use heavy machinery until tomorrow (because of the sedation medicines used during the test).    FOLLOW UP: Our staff will call the number listed on your records 48-72 hours following your procedure to check on you and address any questions or concerns that you may have regarding the information given to you following your procedure. If we do not reach you, we will leave a message.  We will attempt to reach you two times.  During this call, we will ask if you have developed any symptoms of COVID 19. If you develop any symptoms (ie: fever, flu-like symptoms, shortness of breath, cough etc.) before then, please call [phone].  If you test positive for Covid 19 in the 2 weeks post procedure, please call and report this information to [place].    If any biopsies were taken you will be contacted by phone or by letter within the next 1-3 weeks.  Please call us at [phone] if you have not heard about the biopsies in 3 weeks.    SIGNATURES/CONFIDENTIALITY: You and/or your care partner have signed paperwork which will be entered into your  electronic medical record.  These signatures attest to the fact that that the information above on your After Visit Summary has been reviewed and is understood.  Full responsibility of the confidentiality of this discharge information lies with you and/or your care-partner.

## 2021-05-08 NOTE — Op Note (Signed)
[facility] Patient Name: Connor Andrews Procedure Date: 05/08/2021 1:32 PM MRN: [id number] Endoscopist: Lorin Picket E. Tomasa Rand , MD Age: 50 Referring MD:  Date of Birth: [birth year] Gender: Male Account #: [id number] Procedure:                Upper GI endoscopy Indications:              Weight loss Medicines:                Monitored Anesthesia Care Procedure:                Pre-Anesthesia Assessment:                           - Prior to the procedure, a History and Physical                            was performed, and patient medications and                            allergies were reviewed. The patient's tolerance of                            previous anesthesia was also reviewed. The risks                            and benefits of the procedure and the sedation                            options and risks were discussed with the patient.                            All questions were answered, and informed consent                            was obtained. Prior Anticoagulants: The patient has                            taken no previous anticoagulant or antiplatelet                            agents. ASA Grade Assessment: III - A patient with                            severe systemic disease. After reviewing the risks                            and benefits, the patient was deemed in                            satisfactory condition to undergo the procedure.                           After obtaining informed consent, the endoscope was  passed under direct vision. Throughout the                            procedure, the patient's blood pressure, pulse, and                            oxygen saturations were monitored continuously. The                            Endoscope was introduced through the mouth, and                            advanced to the third part of duodenum. The upper                            GI endoscopy was accomplished without  difficulty.                            The patient tolerated the procedure well. Scope In: Scope Out: Findings:                 The examined esophagus was normal.                           The entire examined stomach was normal.                           The examined duodenum was normal. Complications:            No immediate complications. Estimated Blood Loss:     Estimated blood loss: none. Impression:               - Normal esophagus.                           - Normal stomach.                           - Normal examined duodenum.                           - No specimens collected.                           - No abnormalities to explain patient's weight loss. Recommendation:           - Patient has a contact number available for                            emergencies. The signs and symptoms of potential                            delayed complications were discussed with the                            patient. Return to normal activities tomorrow.  Written discharge instructions were provided to the                            patient.                           - Resume previous diet.                           - Continue present medications.                           - Follow up with neurology to further evaluate                            neurologic symptoms. Ranetta Armacost E. Tomasa Rand, MD 05/08/2021 2:07:39 PM This report has been signed electronically.

## 2021-05-08 NOTE — Progress Notes (Signed)
pt tolerated well. VSS. awake and to recovery. Report given to RN.  

## 2021-05-08 NOTE — Op Note (Signed)
[facility] Patient Name: Connor Andrews Procedure Date: 05/08/2021 1:31 PM MRN: [id number] Endoscopist: Lorin Picket E. Tomasa Rand , MD Age: 50 Referring MD:  Date of Birth: 11-Apr-1971 Gender: Male Account #: [id number] Procedure:                Colonoscopy Indications:              This is the patient's first colonoscopy, Weight loss Medicines:                Monitored Anesthesia Care Procedure:                Pre-Anesthesia Assessment:                           - Prior to the procedure, a History and Physical                            was performed, and patient medications and                            allergies were reviewed. The patient's tolerance of                            previous anesthesia was also reviewed. The risks                            and benefits of the procedure and the sedation                            options and risks were discussed with the patient.                            All questions were answered, and informed consent                            was obtained. Prior Anticoagulants: The patient has                            taken no previous anticoagulant or antiplatelet                            agents. ASA Grade Assessment: III - A patient with                            severe systemic disease. After reviewing the risks                            and benefits, the patient was deemed in                            satisfactory condition to undergo the procedure.                           After obtaining informed consent, the colonoscope  was passed under direct vision. Throughout the                            procedure, the patient's blood pressure, pulse, and                            oxygen saturations were monitored continuously. The                            CF [id number] #[id number] was introduced through the anus                            and advanced to the the terminal ileum, with                             identification of the appendiceal orifice and IC                            valve. The colonoscopy was performed without                            difficulty. The patient tolerated the procedure                            well. The quality of the bowel preparation was                            good. The terminal ileum, ileocecal valve,                            appendiceal orifice, and rectum were photographed.                            The bowel preparation used was Miralax via split                            dose instruction. Scope In: 1:47:00 PM Scope Out: 2:02:46 PM Scope Withdrawal Time: 0 hours 12 minutes 17 seconds  Total Procedure Duration: 0 hours 15 minutes 46 seconds  Findings:                 The perianal and digital rectal examinations were                            normal. Pertinent negatives include normal                            sphincter tone and no palpable rectal lesions.                           Many small and large-mouthed diverticula were found                            in the sigmoid colon, descending colon and cecum.  The exam was otherwise normal throughout the                            examined colon.                           The terminal ileum appeared normal.                           Non-bleeding internal hemorrhoids were found during                            retroflexion. The hemorrhoids were Grade I                            (internal hemorrhoids that do not prolapse).                           No additional abnormalities were found on                            retroflexion. Complications:            No immediate complications. Estimated Blood Loss:     Estimated blood loss: none. Impression:               - Diverticulosis in the sigmoid colon, in the                            descending colon and in the cecum.                           - The examined portion of the ileum was normal.                           -  Non-bleeding internal hemorrhoids.                           - No specimens collected.                           - No abnormalities to explain weight loss. Recommendation:           - Patient has a contact number available for                            emergencies. The signs and symptoms of potential                            delayed complications were discussed with the                            patient. Return to normal activities tomorrow.                            Written discharge instructions were provided to the  patient.                           - Resume previous diet.                           - Continue present medications.                           - Repeat colonoscopy in 10 years for screening                            purposes.                           - Follow up with neurology Michaiah Maiden E. Tomasa Rand, MD 05/08/2021 2:11:13 PM This report has been signed electronically.

## 2021-05-08 NOTE — Progress Notes (Signed)
Pt's states no medical or surgical changes since previsit or office visit. 

## 2021-05-09 ENCOUNTER — Telehealth: Payer: Self-pay

## 2021-05-09 ENCOUNTER — Other Ambulatory Visit: Payer: Self-pay

## 2021-05-09 MED ORDER — AMLODIPINE BESYLATE 10 MG PO TABS: 10.0000 mg | ORAL_TABLET | Freq: Every day | ORAL | 1 refills | Status: DC

## 2021-05-09 NOTE — Telephone Encounter (Signed)
The pt is requesting a refill on his tramadol.

## 2021-05-12 ENCOUNTER — Telehealth: Payer: Self-pay | Admitting: *Deleted

## 2021-05-12 NOTE — Telephone Encounter (Signed)
Attempted f/u phone call. No answer. Left message. °

## 2021-05-12 NOTE — Telephone Encounter (Signed)
  Follow up Call-  Call back number 05/08/2021  Post procedure Call Back phone  # [phone]  Permission to leave phone message Yes  Some recent data might be hidden   LMOM to call back with any questions or concerns.  Also, call back if patient has developed fever, respiratory issues or been dx with COVID or had any family members or close contacts diagnosed since her procedure.

## 2021-05-13 ENCOUNTER — Other Ambulatory Visit: Payer: Self-pay | Admitting: Nurse Practitioner

## 2021-05-13 DIAGNOSIS — M25541 Pain in joints of right hand: Secondary | ICD-10-CM

## 2021-05-13 DIAGNOSIS — M5441 Lumbago with sciatica, right side: Secondary | ICD-10-CM

## 2021-05-13 MED ORDER — TRAMADOL HCL 50 MG PO TABS: 50.0000 mg | ORAL_TABLET | Freq: Four times a day (QID) | ORAL | 0 refills | Status: DC | PRN

## 2021-06-02 ENCOUNTER — Encounter: Payer: Self-pay | Admitting: Nurse Practitioner

## 2021-06-02 ENCOUNTER — Ambulatory Visit (INDEPENDENT_AMBULATORY_CARE_PROVIDER_SITE_OTHER): Payer: Self-pay | Admitting: Nurse Practitioner

## 2021-06-02 ENCOUNTER — Other Ambulatory Visit: Payer: Self-pay

## 2021-06-02 VITALS — BP 134/70 | HR 82 | Temp 98.4°F | Ht 70.0 in | Wt 133.6 lb

## 2021-06-02 DIAGNOSIS — E559 Vitamin D deficiency, unspecified: Secondary | ICD-10-CM

## 2021-06-02 DIAGNOSIS — M4802 Spinal stenosis, cervical region: Secondary | ICD-10-CM

## 2021-06-02 DIAGNOSIS — M25541 Pain in joints of right hand: Secondary | ICD-10-CM

## 2021-06-02 DIAGNOSIS — R200 Anesthesia of skin: Secondary | ICD-10-CM

## 2021-06-02 DIAGNOSIS — R202 Paresthesia of skin: Secondary | ICD-10-CM

## 2021-06-02 MED ORDER — VITAMIN D (ERGOCALCIFEROL) 1.25 MG (50000 UNIT) PO CAPS: 50000.0000 [IU] | ORAL_CAPSULE | ORAL | 1 refills | Status: DC

## 2021-06-02 MED ORDER — CYANOCOBALAMIN 1000 MCG PO TABS: 1000.0000 ug | ORAL_TABLET | Freq: Every day | ORAL | 1 refills | Status: DC

## 2021-06-02 MED ORDER — THIAMINE HCL 100 MG PO TABS: 100.0000 mg | ORAL_TABLET | Freq: Every day | ORAL | 1 refills | Status: DC

## 2021-06-02 NOTE — Progress Notes (Signed)
I,Katawbba Wiggins,acting as a Neurosurgeon for [employer], FNP.,have documented all relevant documentation on the behalf of Arnette Felts, FNP,as directed by  Arnette Felts, FNP while in the presence of Arnette Felts, FNP.  This visit occurred during the SARS-CoV-2 public health emergency.  Safety protocols were in place, including screening questions prior to the visit, additional usage of staff PPE, and extensive cleaning of exam room while observing appropriate contact time as indicated for disinfecting solutions.  Subjective:     Patient ID: Connor Andrews , male    DOB: 16-Apr-1971 , 50 y.o.   MRN: [id number]   No chief complaint on file.   HPI  The patient states that he feels that the medications are only making him sleep.  The patient said the numbness is on his left side, getting worse on the right hand and his feet are getting numb as well, he feels like he has liquid in his hands. He feels like his hand is swollen and the fingertips feel funny. He is not able to write. He is having difficulty with washing himself. Unable to eat due to not feeling the spoon or fork.  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  He is signing up for disability through the state. The patient states that he may need to be evaluated more.  He is reporting the inability to feel anything on his body.   He states "it is depressing because I can't do anything"    He reports when he is sleep the pain is not there, then he will take more medication when he wakes up. When he stands up the numbness worsens.   His MRI of cervical spine and thoracic area revealed as noted in report:   1. Patchy increased T2 signal mostly within the dorsal cervical cord from C3 to C6-C7, but also involving the right and left hemicords at C5-C6. Some but not all of these areas demonstrate mild enhancement. Differential considerations include transverse myelitis, ADEM, and subacute combined degeneration. 2.  Multilevel cervical spondylosis as described above. Severe neuroforaminal stenosis on the left at C3-C4 bilaterally at C4-C5.   Wt Readings from Last 3 Encounters: 06/02/21 : 133 lb 9.6 oz (60.6 kg) 05/08/21 : 130 lb (59 kg) 04/17/21 : 128 lb (58.1 kg)  He is also in the process of trying to get disability through his job, has forms to drop off at the desk.    Past Medical History:  Diagnosis Date   Arthritis    right hip   Hypertension    Low back pain    right side     Family History  Problem Relation Age of Onset   Hypertension Mother    Diabetes Mother    Hypertension Father      Current Outpatient Medications:    amLODipine (NORVASC) 10 MG tablet, Take 1 tablet (10 mg total) by mouth daily., Disp: 90 tablet, Rfl: 1   citalopram (CELEXA) 10 MG tablet, Take 1 tablet (10 mg total) by mouth daily., Disp: 30 tablet, Rfl: 2   cyanocobalamin 1000 MCG tablet, Take 1 tablet (1,000 mcg total) by mouth daily., Disp: 30 tablet, Rfl: 1   losartan (COZAAR) 25 MG tablet, Take 1 tablet (25 mg total) by mouth daily., Disp: 30 tablet, Rfl: 1   pregabalin (LYRICA) 25 MG capsule, Take 1 capsule (25 mg total) by mouth 2 (two) times daily., Disp: 60 capsule, Rfl: 2   thiamine 100 MG tablet, Take 1  tablet (100 mg total) by mouth daily., Disp: 30 tablet, Rfl: 1   traMADol (ULTRAM) 50 MG tablet, Take 1 tablet (50 mg total) by mouth every 6 (six) hours as needed for moderate pain., Disp: 30 tablet, Rfl: 0   Vitamin D, Ergocalciferol, (DRISDOL) 1.25 MG (50000 UNIT) CAPS capsule, Take 1 capsule (50,000 Units total) by mouth 2 (two) times a week., Disp: 24 capsule, Rfl: 1   No Known Allergies   Review of Systems  Constitutional: Negative.   Respiratory: Negative.    Cardiovascular: Negative.  Negative for chest pain, palpitations and leg swelling.  Neurological:  Negative for dizziness and headaches.  Psychiatric/Behavioral: Negative.      Today's Vitals   06/02/21 1119  BP: 134/70  Pulse:  82  Temp: 98.4 F (36.9 C)  TempSrc: Oral  Weight: 133 lb 9.6 oz (60.6 kg)  Height: 5\' 10"  (1.778 m)   Body mass index is 19.17 kg/m.  Wt Readings from Last 3 Encounters:  06/02/21 133 lb 9.6 oz (60.6 kg)  05/08/21 130 lb (59 kg)  04/17/21 128 lb (58.1 kg)    BP Readings from Last 3 Encounters:  06/02/21 134/70  05/08/21 107/80  04/17/21 110/70    Objective:  Physical Exam Vitals reviewed.  Constitutional:      General: He is not in acute distress.    Appearance: Normal appearance.  Cardiovascular:     Rate and Rhythm: Normal rate and regular rhythm.     Pulses: Normal pulses.     Heart sounds: Normal heart sounds. No murmur heard. Pulmonary:     Effort: Pulmonary effort is normal. No respiratory distress.     Breath sounds: Normal breath sounds. No wheezing.  Musculoskeletal:        General: No swelling or tenderness.  Skin:    Capillary Refill: Capillary refill takes less than 2 seconds.  Neurological:     General: No focal deficit present.     Mental Status: He is alert and oriented to person, place, and time.     Cranial Nerves: No cranial nerve deficit.     Sensory: Sensory deficit (right upper arm more than left) present.     Motor: No weakness.     Deep Tendon Reflexes:     Reflex Scores:      Brachioradialis reflexes are 1+ on the right side and 1+ on the left side.    Comments: Grip is weakened, bilateral hands are swollen        Assessment And Plan:     1. Arthralgia of right hand Comments: Continues to have hand pain - Ambulatory referral to Neurosurgery  2. Numbness and tingling in both hands Comments: symptoms are worsening, and moving to the left side. Now affecting his ability to hold on to items. We are trying to set an appt with Neurology/neurosurgeon. I will not make any changes to his medications at this time, has not been effective.  - cyanocobalamin 1000 MCG tablet; Take 1 tablet (1,000 mcg total) by mouth daily.  Dispense: 30 tablet;  Refill: 1 - thiamine 100 MG tablet; Take 1 tablet (100 mg total) by mouth daily.  Dispense: 30 tablet; Refill: 1 - Vitamin D, Ergocalciferol, (DRISDOL) 1.25 MG (50000 UNIT) CAPS capsule; Take 1 capsule (50,000 Units total) by mouth 2 (two) times a week.  Dispense: 24 capsule; Refill: 1 - Ambulatory referral to Neurosurgery  3. Cervical stenosis of spine Comments: Noted on his MRI cervical spine moderate to severe stenosis to C3-T1  with some bulging discs. Will refer to Neurosurgery.  - Ambulatory referral to Neurosurgery  4. Vitamin D deficiency - cyanocobalamin 1000 MCG tablet; Take 1 tablet (1,000 mcg total) by mouth daily.  Dispense: 30 tablet; Refill: 1 - thiamine 100 MG tablet; Take 1 tablet (100 mg total) by mouth daily.  Dispense: 30 tablet; Refill: 1 - Vitamin D, Ergocalciferol, (DRISDOL) 1.25 MG (50000 UNIT) CAPS capsule; Take 1 capsule (50,000 Units total) by mouth 2 (two) times a week.  Dispense: 24 capsule; Refill: 1   I personally spent 45 minutes face-to-face and non-face-to-face in the care of this patient, which includes all pre-, intra-, and post visit time on the date of service.  Patient was given opportunity to ask questions. Patient verbalized understanding of the plan and was able to repeat key elements of the plan. All questions were answered to their satisfaction.  Arnette Felts, FNP   I, Arnette Felts, FNP, have reviewed all documentation for this visit. The documentation on 06/02/21 for the exam, diagnosis, procedures, and orders are all accurate and complete.   IF YOU HAVE BEEN REFERRED TO A SPECIALIST, IT MAY TAKE 1-2 WEEKS TO SCHEDULE/PROCESS THE REFERRAL. IF YOU HAVE NOT HEARD FROM US/SPECIALIST IN TWO WEEKS, PLEASE GIVE [place] A CALL AT [phone].   THE PATIENT IS ENCOURAGED TO PRACTICE SOCIAL DISTANCING DUE TO THE COVID-19 PANDEMIC.

## 2021-06-03 ENCOUNTER — Telehealth: Payer: Self-pay

## 2021-06-03 NOTE — Telephone Encounter (Signed)
Spoke with pt reminding him of apt with Dr Allena Katz on 9/26. Pt also wanted to know If disability paperwork was available, he was notified 7-10 business days, & we will call when paperwork is ready.

## 2021-06-04 ENCOUNTER — Encounter: Payer: Self-pay | Admitting: Nurse Practitioner

## 2021-06-11 ENCOUNTER — Other Ambulatory Visit: Payer: Self-pay | Admitting: Nurse Practitioner

## 2021-06-11 NOTE — Progress Notes (Signed)
Spoke with patient and advised he has a referral to neurosurgery due to the numbness and tingling to his arms and legs. Phone number given.

## 2021-07-07 ENCOUNTER — Other Ambulatory Visit: Payer: Self-pay

## 2021-07-07 ENCOUNTER — Encounter: Payer: Self-pay | Admitting: Neurology

## 2021-07-07 ENCOUNTER — Ambulatory Visit (INDEPENDENT_AMBULATORY_CARE_PROVIDER_SITE_OTHER): Payer: Self-pay | Admitting: Neurology

## 2021-07-07 VITALS — BP 154/103 | HR 64 | Wt 134.0 lb

## 2021-07-07 DIAGNOSIS — R4589 Other symptoms and signs involving emotional state: Secondary | ICD-10-CM

## 2021-07-07 DIAGNOSIS — G0491 Myelitis, unspecified: Secondary | ICD-10-CM

## 2021-07-07 DIAGNOSIS — R29898 Other symptoms and signs involving the musculoskeletal system: Secondary | ICD-10-CM

## 2021-07-07 NOTE — Progress Notes (Addendum)
[place] [facility] Clinic Note - Initial Visit   Date: 07/07/21  Lakoda Mcanany MRN: [id number] DOB: 12/15/1970   Dear Dr. Tyrell Antonio:   Thank you for your kind referral of Elridge Stemm for consultation of right hand weakness. Although his history is well known to you, please allow [place] to reiterate it for the purpose of our medical record. The patient was accompanied to the clinic by friend, Andee Poles. who also provides collateral information.     History of Present Illness: Connor Andrews is a 50 y.o. right-handed male with hyperlipidemia, tobacco use, and low back pain presenting for evaluation of right arm weakness/numbness and abnormal MRI cervical spine.  Starting around the summer of 2021, he began having weakness and numbness of the right arm and fingers.  His tingling progressed to involve his forearm, left fingers, and legs.  He also has 20-30lb unintentional weight loss.    He was hospitalized at [facility] from 6/26 - 04/11/2021 and had extensive evaluation. Notable findings include MRI cervical spine showing increased T2 signal involving the dorsal cervical cord from C3 to C6-7, with some mild enhancement. MRI brain shows mild subcortical white matter changes.  MRI thoracic spine was normal. CSF testing shows was essentially normal (R4  W2 G57 G46*), no oligoclonal bands, normal IgG index, ACE, and VDRL.  Labs including CBC, CMP, TSH, ANA, HIV, VZV, CMV, RPR, NMO, anti-DNA, RF, vitamin B1, folate, MMA, and TB was negative.  His vitamin B12 is low-normal at 290.  Initially, there was concern that his CSF grew GPC so he was empirically start on antibiotics. Repeat cultures were negative and antibiotics were discontinued after which he was given 5-day of IV methylprednisolone for possible transverse myelitis. He was discharged home and has not been getting any physical therapy.    He has not noticed any improvement in any of this symptoms since receiving steroids, in fact,  he feels that his tingling and weakness is getting worse. He is very depressed by his physical limitations.  Most of his history has been obtained from [place], who has accompanied him here today.  He answers questions with one-word answers.    He lives with parents. He previously worked as an Agricultural consultant for a [employer]    Out-side paper records, electronic medical record, and images have been reviewed where available and summarized as:  MRI cervical and thoracic cord 04/06/2021: Cervical spine: 1. Patchy increased T2 signal mostly within the dorsal cervical cord from C3 to C6-C7, but also involving the right and left hemicords at C5-C6. Some but not all of these areas demonstrate mild enhancement. Differential considerations include transverse myelitis, ADEM, and subacute combined degeneration. 2. Multilevel cervical spondylosis as described above. Severe neuroforaminal stenosis on the left at C3-C4 bilaterally at C4-C5.   Thoracic spine:   1. Normal MRI of the thoracic spine.  MRI brain w contrast 04/07/2021: No abnormal enhancement or mass.  MRI brain wo contrast 04/06/2021: 1. No evidence of acute intracranial abnormality. Specifically, no acute infarct. 2. Moderate T2/FLAIR hyperintensities within the white matter, which are age-advanced in number. This finding is nonspecific but can be seen in the setting of chronic microvascular ischemia, a demyelinating process such as multiple sclerosis, chronic migraines, or as the sequelae of a prior infectious or inflammatory process.     Past Medical History:  Diagnosis Date   Arthritis    right hip   Hypertension    Low back pain    right side  History reviewed. No pertinent surgical history.   Medications:  Outpatient Encounter Medications as of 07/07/2021  Medication Sig   amLODipine (NORVASC) 10 MG tablet Take 1 tablet (10 mg total) by mouth daily.   cyanocobalamin 1000 MCG tablet Take 1 tablet (1,000 mcg total) by  mouth daily.   losartan (COZAAR) 25 MG tablet Take 1 tablet (25 mg total) by mouth daily.   pregabalin (LYRICA) 25 MG capsule Take 1 capsule (25 mg total) by mouth 2 (two) times daily.   thiamine 100 MG tablet Take 1 tablet (100 mg total) by mouth daily.   traMADol (ULTRAM) 50 MG tablet Take 1 tablet (50 mg total) by mouth every 6 (six) hours as needed for moderate pain.   Vitamin D, Ergocalciferol, (DRISDOL) 1.25 MG (50000 UNIT) CAPS capsule Take 1 capsule (50,000 Units total) by mouth 2 (two) times a week.   citalopram (CELEXA) 10 MG tablet Take 1 tablet (10 mg total) by mouth daily. (Patient not taking: Reported on 07/07/2021)   No facility-administered encounter medications on file as of 07/07/2021.    Allergies: No Known Allergies  Family History: Family History  Problem Relation Age of Onset   Hypertension Mother    Diabetes Mother    Hypertension Father     Social History: Social History   Tobacco Use   Smoking status: Every Day    Packs/day: 1.00    Types: Cigarettes   Smokeless tobacco: Never   Tobacco comments:    07/22/20 1 pk a week     07/07/2021: Patient has been cutting down   Vaping Use   Vaping Use: Never used  Substance Use Topics   Alcohol use: Not Currently    Comment: weekends only   Drug use: No   Social History   Social History Narrative   Lives with wife   Right handed    Lives in a one story home     Vital Signs:  BP (!) 154/103   Pulse 64   Wt 134 lb (60.8 kg)   SpO2 98%   BMI 19.23 kg/m    Neurological Exam: MENTAL STATUS including orientation to time, place, person, recent and remote memory, attention span and concentration, language, and fund of knowledge is normal.  Speech is not dysarthric. Depressed appearing, paucity of speech.  CRANIAL NERVES: II:  No visual field defects.    III-IV-VI: Pupils equal round and reactive to light.  Normal conjugate, extra-ocular eye movements in all directions of gaze.  No nystagmus.  No ptosis.    V:  Normal facial sensation.    VII:  Normal facial symmetry and movements.   VIII:  Normal hearing and vestibular function.   IX-X:  Normal palatal movement.   XI:  Normal shoulder shrug and head rotation.   XII:  Normal tongue strength and range of motion, no deviation or fasciculation.  MOTOR:  No atrophy, fasciculations or abnormal movements.  No pronator drift.   Upper Extremity:  Right  Left  Deltoid  5/5   5/5   Biceps  5/5   5/5   Triceps  5/5   5/5   Infraspinatus 5/5  5/5  Medial pectoralis 5/5  5/5  Wrist extensors  3/5   5/5   Wrist flexors  3/5   5/5   Finger extensors  4/5   5-/5   Finger flexors  4/5   5-/5   Dorsal interossei  4/5   5-/5   Abductor pollicis  4/5  5-/5   Tone (Ashworth scale)  0  0   Lower Extremity:  Right  Left  Hip flexors  5-/5   5/5   Hip extensors  5-/5   5/5   Adductor 5-/5  5/5  Abductor 5-/5  5/5  Knee flexors  5-/5   5/5   Knee extensors  5-/5   5/5   Dorsiflexors  4+/5   5/5   Plantarflexors  4+/5   5/5   Toe extensors  4+/5   5/5   Toe flexors  4+/5   5/5   Tone (Ashworth scale)  0  0   MSRs:  Right        Left                  brachioradialis 2+  2+  biceps 1+  1+  triceps 2+  2+  patellar 2+  2+  ankle jerk 2+  2+  Hoffman no  no  plantar response down  down   SENSORY:  Vibration reduced over the right arm and ankles bilaterally.  Pin prick and temperature reduced diffusely over the right arm and feet bilaterally.  Rhomberg sign is absent.  COORDINATION/GAIT: Imprecision with finger to nose testing on the right due to weakness, intact the left.   Gait narrow based and stable. Tandem and stressed gait intact.    IMPRESSION: Myelitis of the cervical spine, unclear etiology, manifesting with right arm >> leg weakness.   Surprisingly, his exam does not have significant upper motor neuron findings.  His CSF testing and serology testing has been normal.  Specifically, NMO antibody is negative and he has no oligoclonal  bands on CSF, normal cell count, and protein was borderline at 46.  CSF ACE was also normal.   Findings are not typical for demyelinating disease or transverse myelitis, as I would expect more of a acute to subacute course and his reports ongoing symptoms for the past year.  Findings also seem more diffuse than I would expect with transverse myelitis. I would expect his vitamin B12 level to be much lower for subacute combined degeneration.  With his weight loss, other possibilities such as infiltrative process or paraneoplastic disease is also considered, although rare.    I will plan to repeat his cervical spine imaging to look for interval change, check a few additional labs, and start PT.  I may refer him to neuroimmunology for their opinion, based on the results of his MRI.   PLAN/RECOMMENDATIONS:  MRI cervical spine wwo contrast Check ESR, CRP, copper, SPEP with IFE Start physical therapy For his depression, I will refer him to behavioral health  Further recommendations pending results.  Total time spent reviewing records, interview, history/exam, documentation, and coordination of care on day of encounter:  60 min    Thank you for allowing me to participate in patient's care.  If I can answer any additional questions, I would be pleased to do so.    Sincerely,    Jaslyn Bansal K. Posey Pronto, DO

## 2021-07-07 NOTE — Patient Instructions (Signed)
MRI cervical spine wwo contrast  NCS/EMG of the upper extremities  Referral to out-patient physical therapy  Referral to behavioral health

## 2021-07-08 ENCOUNTER — Telehealth: Payer: Self-pay

## 2021-07-08 NOTE — Telephone Encounter (Signed)
Called patient and left a message for a call back. Once patient calls back will need to tell him:   Please order sedimentation rate, CRP, copper, SPEP with IFE.  You will need to let pt know when to come for labs.  Also, please let them know that I am going to cancel his nerve testing since I want to get his MRI cervical spine first, which is where changes were seen when he was in the hospital.  Thanks.

## 2021-07-14 ENCOUNTER — Telehealth: Payer: Self-pay | Admitting: Neurology

## 2021-07-14 ENCOUNTER — Other Ambulatory Visit: Payer: Self-pay

## 2021-07-14 DIAGNOSIS — G0491 Myelitis, unspecified: Secondary | ICD-10-CM

## 2021-07-14 NOTE — Telephone Encounter (Signed)
Patient called back in returning a call from [facility] about ordering some lab work.

## 2021-07-14 NOTE — Telephone Encounter (Signed)
The pt called and said that he went to his neurology appt, he's still in pain, the tramadol and pregabalin isn't really helping but that he needed a refill.  The pt was notified to contact the neurologist that it's a note that they left a message for him to contact them, that they want to do additional labs on the pt and that I would let Christell Constant, DNP, FNP- [facility] kmow what he said.

## 2021-07-15 ENCOUNTER — Other Ambulatory Visit (INDEPENDENT_AMBULATORY_CARE_PROVIDER_SITE_OTHER): Payer: Self-pay

## 2021-07-15 ENCOUNTER — Other Ambulatory Visit: Payer: Self-pay | Admitting: Nurse Practitioner

## 2021-07-15 ENCOUNTER — Other Ambulatory Visit: Payer: Self-pay

## 2021-07-15 ENCOUNTER — Telehealth: Payer: Self-pay | Admitting: Neurology

## 2021-07-15 DIAGNOSIS — M25541 Pain in joints of right hand: Secondary | ICD-10-CM

## 2021-07-15 DIAGNOSIS — R202 Paresthesia of skin: Secondary | ICD-10-CM

## 2021-07-15 DIAGNOSIS — G0491 Myelitis, unspecified: Secondary | ICD-10-CM

## 2021-07-15 DIAGNOSIS — M5441 Lumbago with sciatica, right side: Secondary | ICD-10-CM

## 2021-07-15 DIAGNOSIS — R200 Anesthesia of skin: Secondary | ICD-10-CM

## 2021-07-15 LAB — SEDIMENTATION RATE: Sed Rate: 7 mm/hr (ref 0–20)

## 2021-07-15 LAB — C-REACTIVE PROTEIN: CRP: <1 mg/dL (ref 0.5–20.0)

## 2021-07-15 MED ORDER — PREGABALIN 25 MG PO CAPS: 25.0000 mg | ORAL_CAPSULE | Freq: Two times a day (BID) | ORAL | 2 refills | Status: DC

## 2021-07-15 MED ORDER — TRAMADOL HCL 50 MG PO TABS: 50.0000 mg | ORAL_TABLET | Freq: Four times a day (QID) | ORAL | 0 refills | Status: AC | PRN

## 2021-07-15 NOTE — Telephone Encounter (Signed)
Called patient and was unable to leave a message due to mailbox being full. 

## 2021-07-15 NOTE — Telephone Encounter (Signed)
It looks like his PCP has sent a prescription for ultram and Lyrica.  He may want to contact their office, if the pharmacy does not have anything.

## 2021-07-15 NOTE — Telephone Encounter (Signed)
Talked to patient and explained the labs process. All labs are put in and he is coming this morning

## 2021-07-15 NOTE — Telephone Encounter (Signed)
Pt came in to get labs done and mentioned he was supposed to get some pain medication called in, but there has not been anything at the pharmacy. He uses the [facility] on [address].

## 2021-07-16 NOTE — Telephone Encounter (Signed)
Called patient and unable to leave a message due to mailbox being full.  

## 2021-07-17 NOTE — Telephone Encounter (Signed)
Called patient and unable to leave a message due to voicemail being full.

## 2021-07-21 LAB — PROTEIN ELECTROPHORESIS, SERUM
Albumin ELP: 4.7 g/dL (ref 3.8–4.8)
Alpha 1: 0.2 g/dL (ref 0.2–0.3)
Alpha 2: 0.6 g/dL (ref 0.5–0.9)
Beta 2: 0.3 g/dL (ref 0.2–0.5)
Beta Globulin: 0.3 g/dL — ABNORMAL LOW (ref 0.4–0.6)
Gamma Globulin: 1 g/dL (ref 0.8–1.7)
Total Protein: 7.2 g/dL (ref 6.1–8.1)

## 2021-07-21 LAB — IMMUNOFIXATION ELECTROPHORESIS
IgG (Immunoglobin G), Serum: 974 mg/dL (ref 600–1640)
IgM, Serum: 211 mg/dL (ref 50–300)
Immunoglobulin A: 180 mg/dL (ref 47–310)

## 2021-07-21 LAB — COPPER, SERUM: Copper: 91 ug/dL (ref 70–175)

## 2021-07-30 ENCOUNTER — Ambulatory Visit: Payer: MEDICAID | Admitting: Nurse Practitioner

## 2021-07-30 ENCOUNTER — Other Ambulatory Visit: Payer: Self-pay

## 2021-07-30 DIAGNOSIS — R202 Paresthesia of skin: Secondary | ICD-10-CM

## 2021-07-31 ENCOUNTER — Encounter: Payer: Self-pay | Admitting: Neurology

## 2021-08-08 ENCOUNTER — Other Ambulatory Visit: Payer: Self-pay

## 2021-08-08 ENCOUNTER — Ambulatory Visit (INDEPENDENT_AMBULATORY_CARE_PROVIDER_SITE_OTHER): Payer: Self-pay | Admitting: Neurology

## 2021-08-08 ENCOUNTER — Telehealth: Payer: Self-pay | Admitting: Neurology

## 2021-08-08 DIAGNOSIS — R531 Weakness: Secondary | ICD-10-CM

## 2021-08-08 DIAGNOSIS — R202 Paresthesia of skin: Secondary | ICD-10-CM

## 2021-08-08 NOTE — Telephone Encounter (Signed)
Pt is inquiring about a certain glove to help with his hand. Doesn't know where to get one. He has looked around

## 2021-08-08 NOTE — Procedures (Signed)
[facility]  [address] [place], [address]  [place], [place] [address] Tel: [phone] Fax:  [phone] Test Date:  08/08/2021  Patient: Connor Andrews DOB: January 15, 1971 Physician: Nita Sickle, DO  Sex: Male Height: 5\' 10"  Ref Phys: , DO  ID#: Nita Sickle   Technician:    Patient Complaints: This is a 50 year old man referred for evaluation of right hand weakness.  NCV & EMG Findings: Extensive electrodiagnostic testing of the right upper extremity and additional studies of the left shows: Bilateral median, ulnar, and radial sensory responses are within normal limits. Bilateral median and ulnar motor responses are within normal limits.  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 There is no evidence of active or chronic motor axonal loss changes affecting any of the tested muscles.  There is a variable recruitment pattern in the right upper extremity which may be due to pain, poor effort, or central disorder of motor unit control.  Impression: There is no evidence of a cervical radiculopathy or sensorimotor polyneuropathy affecting the upper extremities.    Of note, there is a global pattern of incomplete motor unit recruitment seen by variable activation in the right arm which may be due to pain, central disorder of motor unit control, or less likely poor effort.     ___________________________ Thressa Sheller, DO    Nerve Conduction Studies Anti Sensory Summary Table   Stim Site NR Peak (ms) Norm Peak (ms) P-T Amp (V) Norm P-T Amp  Left Median Anti Sensory (2nd Digit)  32C  Wrist    3.6 <3.6 16.7 >15  Right Median Anti Sensory (2nd Digit)  32C  Wrist    3.6 <3.6 16.6 >15  Left Radial Anti Sensory (Base 1st Digit)  32C  Wrist    2.6 <2.7 23.2 >14  Right Radial Anti Sensory (Base 1st Digit)  32C  Wrist    2.7 <2.7 18.0 >14  Left Ulnar Anti Sensory (5th Digit)  32C  Wrist    3.1 <3.1 16.9 >10   Right Ulnar Anti Sensory (5th Digit)  32C  Wrist    3.1 <3.1 12.1 >10   Motor Summary Table   Stim Site NR Onset (ms) Norm Onset (ms) O-P Amp (mV) Norm O-P Amp Site1 Site2 Delta-0 (ms) Dist (cm) Vel (m/s) Norm Vel (m/s)  Left Median Motor (Abd Poll Brev)  32C  Wrist    3.8 <4.0 9.7 >6 Elbow Wrist 5.2 28.0 54 >50  Elbow    9.0  8.5  Ulnar-wrist crossover Elbow 3.5 0.0    Ulnar-wrist crossover    5.5  2.4         Right Median Motor (Abd Poll Brev)  32C  Wrist    3.7 <4.0 8.7 >6 Elbow Wrist 4.8 28.0 58 >50  Elbow    8.5  6.2  Ulnar-wrist crossover. Elbow 4.1 0.0    Ulnar-wrist crossover.    4.4  3.4         Left Ulnar Motor (Abd Dig Minimi)  32C  Wrist    3.0 <3.1 9.0 >7 B Elbow Wrist 4.0 21.0 53 >50  B Elbow    7.0  8.5  A Elbow B Elbow 1.6 10.0 63 >50  A Elbow    8.6  8.4         Right Ulnar Motor (Abd Dig Minimi)  32C  Wrist    2.7 <3.1 7.8 >7 B Elbow Wrist 3.9 21.0 54 >50  B Elbow    6.6  7.2  A Elbow B Elbow 1.5 10.0 67 >50  A Elbow    8.1  7.2          F Wave Studies   NR F-Lat (ms) Lat Norm (ms) L-R F-Lat (ms)  Right Ulnar (Mrkrs) (Abd Dig Min)  32C     28.80 <33    EMG   Side Muscle Ins Act Fibs Psw Fasc Number Recrt Dur Dur. Amp Amp. Poly Poly. Comment  Right 1stDorInt Nml Nml Nml Nml 3- Mod-V Nml Nml Nml Nml Nml Nml N/A  Right Triceps Nml Nml Nml Nml 1- Mod-V Nml Nml Nml Nml Nml Nml N/A  Right Ext Indicis Nml Nml Nml Nml 3- Mod-V Nml Nml Nml Nml Nml Nml N/A  Right PronatorTeres Nml Nml Nml Nml 2- Mod-V Nml Nml Nml Nml Nml Nml N/A  Right Biceps Nml Nml Nml Nml 1- Mod-V Nml Nml Nml Nml Nml Nml N/A  Right Deltoid Nml Nml Nml Nml 2- Mod-V Nml Nml Nml Nml Nml Nml N/A  Left 1stDorInt Nml Nml Nml Nml Nml Nml Nml Nml Nml Nml Nml Nml N/A  Left PronatorTeres Nml Nml Nml Nml Nml Nml Nml Nml Nml Nml Nml Nml N/A  Left Biceps Nml Nml Nml Nml Nml Nml Nml Nml Nml Nml Nml Nml N/A      Waveforms:

## 2021-08-12 ENCOUNTER — Other Ambulatory Visit: Payer: Self-pay

## 2021-08-12 DIAGNOSIS — G959 Disease of spinal cord, unspecified: Secondary | ICD-10-CM

## 2021-08-12 NOTE — Telephone Encounter (Signed)
MRI order sent to [facility]. Pt has no insurance

## 2021-08-12 NOTE — Telephone Encounter (Signed)
Please let pt know that his nerve testing does not show injury to the nerve in the arms, but does suggest that we need to look at his cervical spine. Please follow-up on his MRI cervical spine wwo contrast - needs to be at [facility] (pt is not insured).   We have referred him to [facility] PT who may be able to recommend a glove.

## 2021-08-12 NOTE — Telephone Encounter (Signed)
Called patient and left a message for a call back.  

## 2021-08-13 NOTE — Telephone Encounter (Signed)
Patient left a message after hours returning Connor Andrews's call.

## 2021-08-14 NOTE — Telephone Encounter (Signed)
Called and spoke to patient and informed him of EMG results and recommendations. Also, informed patient that he would need to contact [facility] patient assistance to get assistance for his Cervical MRI.  Patient stated that he had a MRI done on 04/06/21 of his cervical spine and wanted to know why he needed another one? Patient stated that it is a waste of his time and money and wanted to ask Dr. Allena Katz if he really needed that again? I informed patient that I would send Dr. Allena Katz his concerns and questions and get back to him.  Patient verbalized understanding and had no further questions or concerns.

## 2021-08-14 NOTE — Telephone Encounter (Signed)
His MRI from June shows abnormalities in his spinal cord causing his hand weakness.  We need to follow-up on this to see if these changes are stable, improving, or getting worse.

## 2021-08-19 NOTE — Telephone Encounter (Signed)
Called patient and explained to patient why another MRI is recommended by Dr. Allena Katz. Patient would like to know what Dr. Eliane Decree plan of care is for him. He is wondering if Dr. Allena Katz is going to refer him out to [facility] or [facility]? Patient states that he doesn't want to spend money on the MRI just to be referred out with no answers to his worsening symptoms. I explained to patient that wherever he is referred to they will want recent MRI imaging when a referral is sent out. Patient stated he just wants to know what to expect because it is a lot of money he has to pay. I informed patient that I will send Dr. Allena Katz a message and will call him back.

## 2021-08-19 NOTE — Telephone Encounter (Signed)
I understand his concerns and know that testing is costly, therefore ordered the imaging at [facility] where cost can be discounted he is applies for [facility] financial assistance.  I would like to get an idea if the changes on his MRI from June have stabilized or whether there are new changes. If there are new abnormalities, additional testing would be indicated. If imaging appears stable or improved, there may not be much else we can do.  If patient would prefer to see neuroimmunology for their opinion, we can certainly refer him. They will most likely want updated imaging. If he would like to be referred, please ask him to request images on CD from [facility] (please provide the number) and take with him, so they can review.

## 2021-08-21 ENCOUNTER — Ambulatory Visit: Payer: Self-pay | Attending: Neurology | Admitting: Physical Therapy

## 2021-08-21 ENCOUNTER — Ambulatory Visit: Payer: MEDICAID | Admitting: Occupational Therapy

## 2021-08-21 ENCOUNTER — Other Ambulatory Visit: Payer: Self-pay

## 2021-08-21 ENCOUNTER — Ambulatory Visit: Payer: MEDICAID | Admitting: Physical Therapy

## 2021-08-21 DIAGNOSIS — R278 Other lack of coordination: Secondary | ICD-10-CM | POA: Insufficient documentation

## 2021-08-21 DIAGNOSIS — M25611 Stiffness of right shoulder, not elsewhere classified: Secondary | ICD-10-CM | POA: Insufficient documentation

## 2021-08-21 DIAGNOSIS — M6281 Muscle weakness (generalized): Secondary | ICD-10-CM | POA: Insufficient documentation

## 2021-08-21 DIAGNOSIS — R262 Difficulty in walking, not elsewhere classified: Secondary | ICD-10-CM | POA: Insufficient documentation

## 2021-08-21 DIAGNOSIS — M79601 Pain in right arm: Secondary | ICD-10-CM | POA: Insufficient documentation

## 2021-08-21 NOTE — Patient Instructions (Signed)
   Moist heat on back for 15"-20" max  Cat/camel position for stretching  Sitting back toward heels - on hands and knees - arms extended       Strengthening: Hip Extension - Resisted    With tubing around right ankle, face anchor and pull leg straight back. Repeat __10__ times per set. Do __1-2_ sets per session. Do _1-2 ___ sessions per day.        Knee Flexion: Resisted (Sitting)  - Tie band to sturdy object in front of you    Sit with band under left foot and looped around ankle of supported leg. Pull unsupported leg back. Repeat __10__ times per set. Do __1-2__ sets per session. Do __1-2__ sessions per day.

## 2021-08-21 NOTE — Telephone Encounter (Signed)
Called patient and left a message for a call back.  

## 2021-08-22 NOTE — Therapy (Signed)
[facility] [address] [place], [place], [address] Phone: [phone]   Fax:  [phone]  Physical Therapy Evaluation  Patient Details  Name: Connor Andrews MRN: [id number] Date of Birth: 06/30/1971 Referring Provider (PT): Nita Sickle, DO   Encounter Date: 08/21/2021   PT End of Session - 08/22/21 1704     Visit Number 1    Authorization Type self pay    PT Start Time 0850    PT Stop Time 0930    PT Time Calculation (min) 40 min    Activity Tolerance Patient tolerated treatment well    Behavior During Therapy [facility] for tasks assessed/performed             Past Medical History:  Diagnosis Date   Arthritis    right hip   Hypertension    Low back pain    right side    No past surgical history on file.  There were no vitals filed for this visit.        [facility] PT Assessment - 08/22/21 0001       Assessment   Medical Diagnosis Myelitis:  Paresthesia: RUE weakness    Referring Provider (PT) Nita Sickle, DO    Onset Date/Surgical Date 04/06/21    Hand Dominance Right      Precautions   Precautions None      Balance Screen   Has the patient fallen in the past 6 months Yes    How many times? 5-6   5-   Has the patient had a decrease in activity level because of a fear of falling?  Yes    Is the patient reluctant to leave their home because of a fear of falling?  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      ROM / Strength   AROM / PROM / Strength Strength      Strength   Overall Strength Within functional limits for tasks performed    Overall Strength Comments RLE strength is  WNL's except for Rt hip extensors (3/5) and hamstrings (3+/5)      Ambulation/Gait   Ambulation/Gait Yes    Ambulation/Gait Assistance 6: Modified independent (Device/Increase time)    Ambulation Distance (Feet) 100 Feet    Assistive device None    Gait Pattern Within Functional Limits    Ambulation Surface Level;Indoor      Standardized Balance Assessment   Standardized Balance Assessment Timed Up and Go Test      Timed Up and Go Test   Normal TUG (seconds) 12.62                        Objective measurements completed on examination: See above findings.                PT Education - 08/22/21 1700     Education Details instructed pt to use moist heat on low back due to c/o discomfort at site of spinal tap; instructed in HEP for Rt hip ext. and hamstring  strengthening    Person(s) Educated Patient    Methods Explanation;Handout;Demonstration    Comprehension Verbalized understanding;Returned demonstration                 PT Long Term Goals - 08/22/21 1712       PT LONG TERM GOAL #1   Title N/A - eval only                    Plan - 08/22/21 1705     Clinical Impression Statement Pt is a 50 yr old gentleman with Rt sided weakness (primarily RUE) and paresthesias.  Pt is scheduled to receive OT services in which his RUE deficits will be addressed.  Pt's gait and balance are WFL's with TUG score 12.62 secs and pt able to perform tandem stance for 30 secs.  Pt does have weakness in his Rt hip extensors & Rt hamstrings; pt was instructed in HEP for strengthening these muscle groups with theraband provided.  No further PT services needed at this time - pt will receive OT to address RUE weakness and decreased functional use.    Personal Factors and Comorbidities Comorbidity 1;Finances    Comorbidities myelitis, Rt sided weakness, Paresthesia    PT Frequency One time visit    PT Next Visit Plan N/A - PT eval only    Recommended Other  Services OT as scheduled    Consulted and Agree with Plan of Care Patient             Patient will benefit from skilled therapeutic intervention in order to improve the following deficits and impairments:     Visit Diagnosis: Muscle weakness (generalized) - Plan: PT plan of care cert/re-cert  Difficulty in walking, not elsewhere classified - Plan: PT plan of care cert/re-cert     Problem List Patient Active Problem List   Diagnosis Date Noted   CSF abnormal    Weight loss    HTN (hypertension) 04/06/2021   Tobacco abuse 04/06/2021   Right sided weakness 04/06/2021    Aliyah Abeyta, Donavan Burnet, PT 08/22/2021, 5:18 PM  [facility] [address] [place], [place], [address] Phone: [phone]   Fax:  [phone]  Name: Connor Andrews MRN: [id number] Date of Birth: [birth year]

## 2021-08-25 ENCOUNTER — Encounter: Payer: Self-pay | Admitting: Occupational Therapy

## 2021-08-25 ENCOUNTER — Ambulatory Visit: Payer: Self-pay | Admitting: Occupational Therapy

## 2021-08-25 ENCOUNTER — Other Ambulatory Visit: Payer: Self-pay

## 2021-08-25 DIAGNOSIS — R278 Other lack of coordination: Secondary | ICD-10-CM

## 2021-08-25 DIAGNOSIS — M79601 Pain in right arm: Secondary | ICD-10-CM

## 2021-08-25 DIAGNOSIS — M25611 Stiffness of right shoulder, not elsewhere classified: Secondary | ICD-10-CM

## 2021-08-25 DIAGNOSIS — M6281 Muscle weakness (generalized): Secondary | ICD-10-CM

## 2021-08-25 NOTE — Therapy (Signed)
[facility] [address] [place], [place], [address] Phone: [phone]   Fax:  [phone]  Occupational Therapy Evaluation  Patient Details  Name: Connor Andrews MRN: [id number] Date of Birth: May 03, 1971 Referring Provider (OT): Nita Sickle, DO   Encounter Date: 08/25/2021   OT End of Session - 08/25/21 1645     Visit Number 1    Number of Visits 9    Date for OT Re-Evaluation 11/03/21   8 visits over 10 weeks   Authorization Type Self Pay  Medicaid Pending    OT Start Time 1100    OT Stop Time 1145    OT Time Calculation (min) 45 min    Activity Tolerance Patient tolerated treatment well    Behavior During Therapy [facility] for tasks assessed/performed             Past Medical History:  Diagnosis Date   Arthritis    right hip   Hypertension    Low back pain    right side    History reviewed. No pertinent surgical history.  There were no vitals filed for this visit.   Subjective Assessment - 08/25/21 1651     Subjective  Pt is a 50 year old male that presents to Neuro OPOT s/p onset of parasthesia and right sided weakness starting in 2021. Pt reports not being able to move hand and numbness and now feeling some of the same symptoms in LUE. Pt lives with parents currently and is unemployed. Pt is completing ADLs with mod I but reports significant difficulty with them.    Pertinent History HLD, Tobacco Use, Low Back Pain    Limitations Sensory Deficits    Patient Stated Goals "get better, get back mobile and back to work"    Currently in Pain? Yes    Pain Score 9     Pain Location Arm    Pain Orientation Right    Pain Descriptors / Indicators Throbbing;Pins and needles;Numbness    Pain Type Neuropathic pain    Pain Radiating Towards all down arm    Pain Onset More than a month ago    Pain Frequency Constant               [facility] OT Assessment - 08/25/21 0001       Assessment   Medical  Diagnosis Parasthesia, R sided weakness    Referring Provider (OT) Nita Sickle, DO    Onset Date/Surgical Date --   reports going on about a year   Hand Dominance Right    Prior Therapy no prior therapy      Precautions   Precautions None    Precaution Comments R sided weakness      Balance Screen   Has the patient fallen in the past 6 months Yes    How many times? 2-3   had PT evaluation     Home  Environment   Family/patient expects to be discharged to: Private residence    Living Arrangements Parent    Available Help at Discharge Family    Type of Home House    Home Layout Able to live on main level with bedroom/bathroom    Bathroom Shower/Tub Tub/Shower unit   standing     Prior Function   Level of Independence Independent    Vocation Unemployed    Leisure likes working on cars, playing with grandkids      ADL   Eating/Feeding Modified independent   needs help but  is not asking for assistance - reports very challenging   Grooming Modified independent   reports challenging   Upper Body Bathing Modified independent   reports challenges with bathing   Lower Body Bathing Modified independent    Upper Body Dressing Increased time;Independent   takes increased time - reports difficulty   Lower Body Dressing Increased time;Modified independent   reports difficulty   Toilet Transfer Modified independent    Toileting - Clothing Manipulation Modified independent   difficult   Toileting -  Hygiene Modified Independent;Increase time    Tub/Shower Transfer Modified independent   reports stepping over tub is difficult     IADL   Prior Level of Function Shopping independent prior    Shopping Completely unable to shop    Prior Level of Function Light Housekeeping independent prior    Light Housekeeping Does not participate in any housekeeping tasks    Prior Level of Function Meal Prep independent prior    Meal Prep Needs to have meals prepared and served;Does not utilize stove or  Immunologist own vehicle    Medication Management Is responsible for taking medication in correct dosages at correct time    Development worker, community financial matters independently (budgets, writes checks, pays rent, bills goes to [employer]), collects and keeps track of income   unemployed     Written Expression   Dominant Hand Right    Handwriting --   did not assess     Vision - History   Baseline Vision No visual deficits    Additional Comments pt reports blurriness - needs glasses he says      Observation/Other Assessments   Focus on Therapeutic Outcomes (FOTO)  N/A      Sensation   Light Touch Impaired Detail    Light Touch Impaired Details Impaired RUE    Hot/Cold Impaired Detail    Hot/Cold Impaired Details Impaired RUE    Proprioception Impaired Detail    Proprioception Impaired Details Impaired RUE      Coordination   9 Hole Peg Test Right;Left    Right 9 Hole Peg Test 52.44s    Left 9 Hole Peg Test 26.13s    Box and Blocks R 36, L 50      Edema   Edema some swelling in RUE d/t decreased movement      ROM / Strength   AROM / PROM / Strength AROM;Strength      AROM   Overall AROM  Deficits    Overall AROM Comments limitations in RUE - not formally measured [facility] for tasks performed      Strength   Overall Strength Deficits    Overall Strength Comments RUE < LUE    Strength Assessment Site Shoulder;Elbow    Right/Left Shoulder Right    Right Shoulder Flexion 4/5   L 5/5   Right Shoulder ABduction 4/5   L 5/5   Right/Left Elbow Right    Right Elbow Flexion 4/5   L 5/5   Right Elbow Extension 4/5   L 5/5     Hand Function   Right Hand Gross Grasp Impaired    Right Hand Grip (lbs) 16.7 lbs    Left Hand Gross Grasp Functional    Left Hand Grip (lbs) 72.5                              OT Education - 08/25/21 1138  Education Details Electrical engineer and strategies    Person(s) Educated Patient     Methods Explanation;Handout;Demonstration    Comprehension Verbalized understanding;Returned demonstration              OT Short Term Goals - 08/25/21 1700       OT SHORT TERM GOAL #1   Title Pt will be independent with HEP targeting RUE grip strength    Time 4    Period Weeks    Status New    Target Date 09/22/21      OT SHORT TERM GOAL #2   Title Pt will verbalize understanding of sensory strategies for increasing safety.    Time 4    Period Weeks    Status On-going      OT SHORT TERM GOAL #3   Title Pt will demonstrate increased grip strength in RUE to 20 lbs or greater    Baseline R 16.7, L 72.5    Time 4    Period Weeks    Status New      OT SHORT TERM GOAL #4   Title Pt will complete Box and Blocks with RUE and score 40 blocks or greater for increase in functional RUE use.    Baseline R 36, L 50    Time 10    Period Weeks    Status New               OT Long Term Goals - 08/25/21 1701       OT LONG TERM GOAL #1   Title Pt will be independent with HEP targeting RUE ROM and proximal strengthening    Time 10    Period Weeks    Status New    Target Date 11/03/21      OT LONG TERM GOAL #2   Title Pt will increase grip strength in RUE to 25 lbs or greater    Baseline R 16.7, L 72.5    Time 10    Period Weeks    Status New      OT LONG TERM GOAL #3   Title Pt will increase fine motor coordination in RUE by completing 9 hole peg test in 45 seconds or less    Baseline R 52.44s, L 26.13s    Time 10    Period Weeks    Status New      OT LONG TERM GOAL #4   Title Pt will report decrease in pain level in RUE to no greater than 7/10 with performing HEP    Time 10    Period Weeks    Status New                   Plan - 08/25/21 1657     Clinical Impression Statement Pt is a 50 year old male that presents to Neuro OPOT s/p onset of parasthesia and right sided weakness starting in 2021. Pt PMH significant for HLD, Tobacco Use, Low Back  Pain. Pt presents with decreased independence with ADLs and IADLs d/t sensation deficits in RUE and general weakness and decreased coordination. Skilled occupational therapy is recommended to target listed areas of deficit and increase independence with ADLs and IADLs.    OT Occupational Profile and History Problem Focused Assessment - Including review of records relating to presenting problem    Occupational performance deficits (Please refer to evaluation for details): ADL's;IADL's;Work    Body Structure / Function / Physical Skills ADL;Decreased knowledge of use  of DME;Strength;Pain;GMC;UE functional use;Proprioception;ROM;IADL;FMC;Coordination;Vision    Rehab Potential Good    Clinical Decision Making Limited treatment options, no task modification necessary    Comorbidities Affecting Occupational Performance: None    Modification or Assistance to Complete Evaluation  No modification of tasks or assist necessary to complete eval    OT Frequency 1x / week    OT Duration Other (comment)   8 visits over 10 weeks d/t any scheduling conflicts   OT Treatment/Interventions Self-care/ADL training;Aquatic Therapy;Cryotherapy;Electrical Stimulation;Paraffin;Ultrasound;Moist Heat;Fluidtherapy;Therapeutic exercise;Neuromuscular education;Energy conservation;Manual Therapy;Patient/family education;Visual/perceptual remediation/compensation;Passive range of motion;Building services engineer;Therapeutic activities;DME and/or AE instruction;Splinting    Plan HEP for RUE shoulder range of motion, grip strength, coordination. Review sensory safety strategies.    Consulted and Agree with Plan of Care Patient             Patient will benefit from skilled therapeutic intervention in order to improve the following deficits and impairments:   Body Structure / Function / Physical Skills: ADL, Decreased knowledge of use of DME, Strength, Pain, GMC, UE functional use, Proprioception, ROM, IADL, FMC, Coordination,  Vision       Visit Diagnosis: Muscle weakness (generalized)  Other lack of coordination  Stiffness of right shoulder, not elsewhere classified  Pain in right arm    Problem List Patient Active Problem List   Diagnosis Date Noted   CSF abnormal    Weight loss    HTN (hypertension) 04/06/2021   Tobacco abuse 04/06/2021   Right sided weakness 04/06/2021    Junious Dresser, OT/L 08/25/2021, 5:09 PM  [facility] [address] [place], [place], [address] Phone: [phone]   Fax:  [phone]  Name: Connor Andrews MRN: [id number] Date of Birth: 1970-11-17

## 2021-08-25 NOTE — Patient Instructions (Addendum)
Tips for safety with sensory deficits:  1. Test hot/cold with Right hand before touching with your Left. 2. Make sure to stay away from radiators, heat or really cold items on the left side    Sensory Retraining Exercises to Try at Home Sensory retraining exercises can help restore your brain's ability to interpret your senses.  All of the exercises involve your sense of touch. Each time you touch something, you send sensory stimulation to your brain and encourage your brain to rewire itself.  Repeat each exercise at least 10 times and practice for about 10-15 minutes a day. Remember, repetition and consistency are most important for a speedy recovery from stroke.  Here are several effective sensory stimulation activities for stroke patients:  1. Tabletop Touch Therapy Gather together objects with different textures and place them onto a table in front of you. Some examples of objects to grab are soft scarves, rough sandpaper, fluffy cotton balls, rough Velcro, and cool silverware.  Without looking at the objects, pick them up and feel them. Try to distinguish the difference between textures.  2. Texture Hunting Fill a bowl with uncooked rice and bury different textured objects in it, like marbles, coins, Velcro strips, and cotton balls. Then reach your hand into the bowl and try to find the objects without looking.  If you can't do this at first, keep trying. Repeated exposure to sensory retraining exercises helps keep the brain stimulated and promote recovery.  3. Texture Handling person holding a soft towel to stimulate the brain with sensory reeducation exercises Have someone place different objects in your hand with your eyes open. Sense how these objects feel.  Once you've gone through all the objects and observed how they feel, perform the exercise again with your eyes closed.  Put your focus into feeling each object to imprint the connection in your mind. Note any difference  between how the objects feel with your eyes open, then closed.  4. Temperature Differentiation This sensory reeducation exercise is particularly beneficial to stroke survivors who have trouble feeling heat or cold.  Soak a cloth in cold water and soak another cloth in warm (but not hot) water. Then, have someone place the cold cloth on your arm. Notice the sensation and if it is different than before the stroke.  After 30 seconds, have them switch the cold cloth with the warm cloth. Try to sense the difference in temperature.  Now, close your eyes. Have someone place one cloth on your arm and try to determine if you're feeling heat or cold.  Repeat this exercise back and forth alternating from hot to cold.  If you don't have someone to help you, then you can perform this exercise using your unaffected hand to place the cloths on your arm.  5. Sensory Locating Close your eyes and have someone place their hand somewhere on your arm. Then, point to the area that you think they touched.  If you don't point to the correct area, have them move your hand. Then, open your eyes to visually absorb the information.  Feedback like this helps retrain your brain. It's like telling your brain, "I was not touched here, I was touched there."  Repeat this exercise at least 10 times, preferably more!  Once you master this sensory retraining exercise, switch it up by having your assistant touch you with different textured objects, like a Q-tip or metal spoon.  Always keep your eyes closed during the exercise, and if you perform the  exercise incorrectly, open your eyes once someone moves your finger to absorb the feedback.    Improving Your Ability to Feel Sensory reeducation provides the brain with the stimulation it needs to recover sensation after stroke.  It emphasizes an important concept of stroke recovery: with enough repetition, there is increased potential for healing, including the return of  sensation and feeling in the affected side.  Results may not come as fast as you want or expect, but when you perform the exercises, the brain will respond. Good luck on the road to recovery!   Source: https://[email or web address]://www.weber-stevens.com/

## 2021-08-26 NOTE — Telephone Encounter (Signed)
Called patient and left a message for a call back.  

## 2021-08-27 NOTE — Telephone Encounter (Signed)
Called patient and call was forwarded to voicemail. Left a message for a call back.

## 2021-08-28 ENCOUNTER — Encounter: Payer: Self-pay | Admitting: Neurology

## 2021-09-01 ENCOUNTER — Encounter: Payer: Self-pay | Admitting: Occupational Therapy

## 2021-09-01 ENCOUNTER — Ambulatory Visit: Payer: Self-pay | Admitting: Occupational Therapy

## 2021-09-01 ENCOUNTER — Other Ambulatory Visit: Payer: Self-pay

## 2021-09-01 DIAGNOSIS — M25611 Stiffness of right shoulder, not elsewhere classified: Secondary | ICD-10-CM

## 2021-09-01 DIAGNOSIS — M6281 Muscle weakness (generalized): Secondary | ICD-10-CM

## 2021-09-01 DIAGNOSIS — M79601 Pain in right arm: Secondary | ICD-10-CM

## 2021-09-01 DIAGNOSIS — R278 Other lack of coordination: Secondary | ICD-10-CM

## 2021-09-01 NOTE — Patient Instructions (Signed)
SELF ASSISTED WITH OBJECT:  ? ?Shoulder Flexion - Supine ? ? ? ?Hold cane with both hands. Raise arms overhead, keep elbows straight. 10_ reps per set, 2-3 sets per day, _5_ days per week ? ?Cane Horizontal - Supine ? ? ? ?With straight arms holding cane above shoulders, bring cane out to right, center, out to left, and back to above head. ?Repeat 10_ times. Do 2-3_ times per day ?Copyright ? V[employer]. All rights reserved.  ? ?Supine: Chest Press (Active) ? ? ? ?Lie on back with arms fully extended. Lower bar or dowel slowly to chest and press to arm's length. Use cane ?Complete 3 sets of _10 repetitions.  ? ?Shoulder: Abduction (Supine) ? ? ? ?With right arm flat on floor, hold dowel in palm. ?Slowly move arm up to side of head by pushing with opposite arm. Do not let elbow bend. ?Hold _10_ seconds. Repeat  10_ times. Do 3_ sessions per day. ?CAUTION: Stretch slowly and gently. ? ?Copyright ? VHI. All rights reserved.   ?

## 2021-09-01 NOTE — Therapy (Signed)
[facility] [address] [place], [place], [address] Phone: [phone]   Fax:  [phone]  Occupational Therapy Treatment  Patient Details  Name: Connor Andrews MRN: [id number] Date of Birth: [birth year] Referring Provider (OT): Nita Sickle, DO   Encounter Date: 09/01/2021   OT End of Session - 09/01/21 1104     Visit Number 2    Number of Visits 9    Date for OT Re-Evaluation 11/03/21   8 visits over 10 weeks   Authorization Type Self Pay  Medicaid Pending    OT Start Time 1102    OT Stop Time 1145    OT Time Calculation (min) 43 min    Activity Tolerance Patient tolerated treatment well    Behavior During Therapy [facility] for tasks assessed/performed             Past Medical History:  Diagnosis Date   Arthritis    right hip   Hypertension    Low back pain    right side    History reviewed. No pertinent surgical history.  There were no vitals filed for this visit.   Subjective Assessment - 09/01/21 1103     Subjective  Pt denies any changes. Pt reports 9/10 pain in RUE arm and hand.    Pertinent History HLD, Tobacco Use, Low Back Pain    Limitations Sensory Deficits    Patient Stated Goals "get better, get back mobile and back to work"    Currently in Pain? Yes    Pain Score 9     Pain Location Arm   and hand   Pain Orientation Right    Pain Descriptors / Indicators Numbness;Tingling   + cold   Pain Type Neuropathic pain    Pain Onset More than a month ago    Pain Frequency Constant                          OT Treatments/Exercises (OP) - 09/01/21 1108       Exercises   Exercises Shoulder;Hand      Shoulder Exercises: Supine   Other Supine Exercises Closed Chain with foam roller x 10 reps shoulder flexion, horizontal abduction, chest press and abduction      Fine Motor Coordination (Hand/Wrist)   Fine Motor Coordination Small Pegboard;Tossing ball;Flipping cards;Stacking  coins;Manipulating coins    Small Pegboard Medium Pegs-  following pattern and placing into medium pegboard with RUE. Pt with min drops and min difficulty. Removed with in hand manipulation with mod difficulty    Flipping cards regular sized playing cards with RUE. Min difficulty.    Stacking coins stacking pennies with RUe    Tossing ball between hands and up in RUE. NO difficulty. Pt rotated ball in RUE with min difficulty.                    OT Education - 09/01/21 1113     Education Details Supine closed chain shoulder HEP - see pt instructions, Coordination HEP RUE    Person(s) Educated Patient    Methods Explanation;Handout;Demonstration    Comprehension Verbalized understanding;Returned demonstration              OT Short Term Goals - 08/25/21 1700       OT SHORT TERM GOAL #1   Title Pt will be independent with HEP targeting RUE grip strength    Time 4    Period Weeks  Status New    Target Date 09/22/21      OT SHORT TERM GOAL #2   Title Pt will verbalize understanding of sensory strategies for increasing safety.    Time 4    Period Weeks    Status On-going      OT SHORT TERM GOAL #3   Title Pt will demonstrate increased grip strength in RUE to 20 lbs or greater    Baseline R 16.7, L 72.5    Time 4    Period Weeks    Status New      OT SHORT TERM GOAL #4   Title Pt will complete Box and Blocks with RUE and score 40 blocks or greater for increase in functional RUE use.    Baseline R 36, L 50    Time 10    Period Weeks    Status New               OT Long Term Goals - 08/25/21 1701       OT LONG TERM GOAL #1   Title Pt will be independent with HEP targeting RUE ROM and proximal strengthening    Time 10    Period Weeks    Status New    Target Date 11/03/21      OT LONG TERM GOAL #2   Title Pt will increase grip strength in RUE to 25 lbs or greater    Baseline R 16.7, L 72.5    Time 10    Period Weeks    Status New      OT LONG  TERM GOAL #3   Title Pt will increase fine motor coordination in RUE by completing 9 hole peg test in 45 seconds or less    Baseline R 52.44s, L 26.13s    Time 10    Period Weeks    Status New      OT LONG TERM GOAL #4   Title Pt will report decrease in pain level in RUE to no greater than 7/10 with performing HEP    Time 10    Period Weeks    Status New                   Plan - 09/01/21 1120     Clinical Impression Statement Pt agreeable to goals. Pt progressing with RUE and goals.    OT Occupational Profile and History Problem Focused Assessment - Including review of records relating to presenting problem    Occupational performance deficits (Please refer to evaluation for details): ADL's;IADL's;Work    Body Structure / Function / Physical Skills ADL;Decreased knowledge of use of DME;Strength;Pain;GMC;UE functional use;Proprioception;ROM;IADL;FMC;Coordination;Vision    Rehab Potential Good    Clinical Decision Making Limited treatment options, no task modification necessary    Comorbidities Affecting Occupational Performance: None    Modification or Assistance to Complete Evaluation  No modification of tasks or assist necessary to complete eval    OT Frequency 1x / week    OT Duration Other (comment)   8 visits over 10 weeks d/t any scheduling conflicts   OT Treatment/Interventions Self-care/ADL training;Aquatic Therapy;Cryotherapy;Electrical Stimulation;Paraffin;Ultrasound;Moist Heat;Fluidtherapy;Therapeutic exercise;Neuromuscular education;Energy conservation;Manual Therapy;Patient/family education;Visual/perceptual remediation/compensation;Passive range of motion;Building services engineer;Therapeutic activities;DME and/or AE instruction;Splinting    Plan grip strength, coordination. Review sensory safety strategies.    Consulted and Agree with Plan of Care Patient             Patient will benefit from skilled therapeutic intervention in order to improve  the  following deficits and impairments:   Body Structure / Function / Physical Skills: ADL, Decreased knowledge of use of DME, Strength, Pain, GMC, UE functional use, Proprioception, ROM, IADL, FMC, Coordination, Vision       Visit Diagnosis: Muscle weakness (generalized)  Other lack of coordination  Stiffness of right shoulder, not elsewhere classified  Pain in right arm    Problem List Patient Active Problem List   Diagnosis Date Noted   CSF abnormal    Weight loss    HTN (hypertension) 04/06/2021   Tobacco abuse 04/06/2021   Right sided weakness 04/06/2021    Junious Dresser, OT/L 09/01/2021, 12:07 PM  [place] [facility] [address] [place], [place], [address] Phone: [phone]   Fax:  [phone]  Name: Connor Andrews MRN: [id number] Date of Birth: 1970/12/18

## 2021-09-09 ENCOUNTER — Ambulatory Visit: Payer: Self-pay | Admitting: Occupational Therapy

## 2021-09-09 ENCOUNTER — Encounter: Payer: Self-pay | Admitting: Occupational Therapy

## 2021-09-09 ENCOUNTER — Other Ambulatory Visit: Payer: Self-pay

## 2021-09-09 DIAGNOSIS — R278 Other lack of coordination: Secondary | ICD-10-CM

## 2021-09-09 DIAGNOSIS — M25611 Stiffness of right shoulder, not elsewhere classified: Secondary | ICD-10-CM

## 2021-09-09 DIAGNOSIS — M6281 Muscle weakness (generalized): Secondary | ICD-10-CM

## 2021-09-09 DIAGNOSIS — M79601 Pain in right arm: Secondary | ICD-10-CM

## 2021-09-09 NOTE — Therapy (Signed)
[facility] [address] [place], [place], [address] Phone: [phone]   Fax:  [phone]  Occupational Therapy Treatment  Patient Details  Name: Connor Andrews MRN: [id number] Date of Birth: 02-01-1971 Referring Provider (OT): Nita Sickle, DO   Encounter Date: 09/09/2021   OT End of Session - 09/09/21 1106     Visit Number 3    Number of Visits 9    Date for OT Re-Evaluation 11/03/21   8 visits over 10 weeks   Authorization Type Self Pay  Medicaid Pending    OT Start Time 1103    OT Stop Time 1145    OT Time Calculation (min) 42 min    Activity Tolerance Patient tolerated treatment well    Behavior During Therapy [employer] for tasks assessed/performed             Past Medical History:  Diagnosis Date   Arthritis    right hip   Hypertension    Low back pain    right side    History reviewed. No pertinent surgical history.  There were no vitals filed for this visit.   Subjective Assessment - 09/09/21 1105     Subjective  "i'm doing alright, how about you?" "I realize when I stretch forward it relieves it a little bit"    Pertinent History HLD, Tobacco Use, Low Back Pain    Limitations Sensory Deficits    Patient Stated Goals "get better, get back mobile and back to work"    Currently in Pain? Yes    Pain Score 10-Worst pain ever    Pain Location Arm   and hand   Pain Orientation Right    Pain Descriptors / Indicators Numbness;Tingling;Tightness    Pain Type Neuropathic pain    Pain Onset More than a month ago    Pain Frequency Constant                          OT Treatments/Exercises (OP) - 09/09/21 0001       ADLs   Functional Mobility functional amublation with emphasis on RUE arm swing for normal movement patterns      Exercises   Exercises Neck      Neck Exercises: Stretches   Neural Stretch Brachial Plexus Nerve Glides x 10 - educated and sent for HEP      Hand  Exercises   Other Hand Exercises Hand Gripper level with black spring with RUE upgraded to level 2 after approx 5 blocks - pt completed with min drops and min difficulty    Other Hand Exercises stacking 1 inch blocks for increased coordination and control with RUE - no drops and no difficulty with task today      Neurological Re-education Exercises   Other Exercises 1 --                    OT Education - 09/09/21 1114     Education Details pt encouraged to follow up with MRI that was ordered on 11/1 by Dr. Allena Katz d/t nature of his symptoms.  Yellow Theraputty RUE  Brachial Plexus Glides HEP    Person(s) Educated Patient    Methods Explanation;Demonstration    Comprehension Verbalized understanding;Need further instruction;Returned demonstration              OT Short Term Goals - 09/09/21 1107       OT SHORT TERM GOAL #1   Title Pt  will be independent with HEP targeting RUE grip strength    Time 4    Period Weeks    Status On-going    Target Date 09/22/21      OT SHORT TERM GOAL #2   Title Pt will verbalize understanding of sensory strategies for increasing safety.    Time 4    Period Weeks    Status On-going      OT SHORT TERM GOAL #3   Title Pt will demonstrate increased grip strength in RUE to 20 lbs or greater    Baseline R 16.7, L 72.5    Time 4    Period Weeks    Status On-going      OT SHORT TERM GOAL #4   Title Pt will complete Box and Blocks with RUE and score 40 blocks or greater for increase in functional RUE use.    Baseline R 36, L 50    Time 10    Period Weeks    Status On-going               OT Long Term Goals - 08/25/21 1701       OT LONG TERM GOAL #1   Title Pt will be independent with HEP targeting RUE ROM and proximal strengthening    Time 10    Period Weeks    Status New    Target Date 11/03/21      OT LONG TERM GOAL #2   Title Pt will increase grip strength in RUE to 25 lbs or greater    Baseline R 16.7, L 72.5     Time 10    Period Weeks    Status New      OT LONG TERM GOAL #3   Title Pt will increase fine motor coordination in RUE by completing 9 hole peg test in 45 seconds or less    Baseline R 52.44s, L 26.13s    Time 10    Period Weeks    Status New      OT LONG TERM GOAL #4   Title Pt will report decrease in pain level in RUE to no greater than 7/10 with performing HEP    Time 10    Period Weeks    Status New                   Plan - 09/09/21 1124     Clinical Impression Statement Pt encourgaed to follow up with MRI orders for cervical spine. Pt with improved RUE use but needs reminders for using functionally and with swinging naturally with ambulation.    OT Occupational Profile and History Problem Focused Assessment - Including review of records relating to presenting problem    Occupational performance deficits (Please refer to evaluation for details): ADL's;IADL's;Work    Body Structure / Function / Physical Skills ADL;Decreased knowledge of use of DME;Strength;Pain;GMC;UE functional use;Proprioception;ROM;IADL;FMC;Coordination;Vision    Rehab Potential Good    Clinical Decision Making Limited treatment options, no task modification necessary    Comorbidities Affecting Occupational Performance: None    Modification or Assistance to Complete Evaluation  No modification of tasks or assist necessary to complete eval    OT Frequency 1x / week    OT Duration Other (comment)   8 visits over 10 weeks d/t any scheduling conflicts   OT Treatment/Interventions Self-care/ADL training;Aquatic Therapy;Cryotherapy;Electrical Stimulation;Paraffin;Ultrasound;Moist Heat;Fluidtherapy;Therapeutic exercise;Neuromuscular education;Energy conservation;Manual Therapy;Patient/family education;Visual/perceptual remediation/compensation;Passive range of motion;Building services engineer;Therapeutic activities;DME and/or AE instruction;Splinting    Plan review  sensory strategies, see how putty is  going, RUE functional use and normal movement patterns, coordination    Consulted and Agree with Plan of Care Patient             Patient will benefit from skilled therapeutic intervention in order to improve the following deficits and impairments:   Body Structure / Function / Physical Skills: ADL, Decreased knowledge of use of DME, Strength, Pain, GMC, UE functional use, Proprioception, ROM, IADL, FMC, Coordination, Vision       Visit Diagnosis: Muscle weakness (generalized)  Other lack of coordination  Stiffness of right shoulder, not elsewhere classified  Pain in right arm    Problem List Patient Active Problem List   Diagnosis Date Noted   CSF abnormal    Weight loss    HTN (hypertension) 04/06/2021   Tobacco abuse 04/06/2021   Right sided weakness 04/06/2021    Junious Dresser, OT/L 09/09/2021, 12:47 PM  [facility] [address] [place], [place], [address] Phone: [phone]   Fax:  [phone]  Name: Connor Andrews MRN: [id number] Date of Birth: December 17, 1970

## 2021-09-09 NOTE — Patient Instructions (Signed)
1. Grip Strengthening (Resistive Putty)   Squeeze putty using thumb and all fingers. Repeat _20___ times. Do __2__ sessions per day.   2. Roll putty into tube on table and pinch between each finger and thumb x 10 reps each. (can do ring and small finger together)     Copyright  VHI. All rights reserved.   

## 2021-09-15 ENCOUNTER — Ambulatory Visit: Payer: Self-pay | Attending: Nurse Practitioner | Admitting: Occupational Therapy

## 2021-09-15 DIAGNOSIS — M6281 Muscle weakness (generalized): Secondary | ICD-10-CM | POA: Insufficient documentation

## 2021-09-15 DIAGNOSIS — M79601 Pain in right arm: Secondary | ICD-10-CM | POA: Insufficient documentation

## 2021-09-15 DIAGNOSIS — R278 Other lack of coordination: Secondary | ICD-10-CM | POA: Insufficient documentation

## 2021-09-22 ENCOUNTER — Ambulatory Visit: Payer: Self-pay | Admitting: Occupational Therapy

## 2021-09-22 ENCOUNTER — Other Ambulatory Visit: Payer: Self-pay

## 2021-09-22 DIAGNOSIS — M79601 Pain in right arm: Secondary | ICD-10-CM

## 2021-09-22 DIAGNOSIS — M6281 Muscle weakness (generalized): Secondary | ICD-10-CM

## 2021-09-22 DIAGNOSIS — R278 Other lack of coordination: Secondary | ICD-10-CM

## 2021-09-22 NOTE — Patient Instructions (Signed)
  MP Flexion (Active)    Bend large knuckles as far as they will go, keeping finger joints straight [employer] bill"). Repeat _10___ times. Do __3__ sessions per day.   Abduction / Adduction (Active)    With hand flat on table, spread all fingers apart, then bring them together as close as possible. Repeat __10__ times. Do __3__ sessions per day.    Recommendations:   1) Button hook for buttons 2) key ring on zipper  3) Red foam on fork/spoon and tan foam on pen 4) Look at hand when using it!! 5) Do NOT hold anything heavy, sharp, breakable, or hot with Rt hand 6) Travel mug w/ lid for coffee 7) Test temperature of water with Lt hand 8) Use foam cup to practice grading pressure of grip

## 2021-09-22 NOTE — Therapy (Signed)
[facility] [address] [place], [place], [address] Phone: [phone]   Fax:  [phone]  Occupational Therapy Treatment  Patient Details  Name: Connor Andrews MRN: [id number] Date of Birth: Mar 06, 1971 Referring Provider (OT): Nita Sickle, DO   Encounter Date: 09/22/2021   OT End of Session - 09/22/21 1217     Visit Number 4    Number of Visits 9    Date for OT Re-Evaluation 11/03/21   8 visits over 10 weeks   Authorization Type Self Pay  Medicaid Pending    OT Start Time 1102    OT Stop Time 1145    OT Time Calculation (min) 43 min    Activity Tolerance Patient tolerated treatment well    Behavior During Therapy [facility] for tasks assessed/performed             Past Medical History:  Diagnosis Date   Arthritis    right hip   Hypertension    Low back pain    right side    No past surgical history on file.  There were no vitals filed for this visit.   Subjective Assessment - 09/22/21 1106     Pertinent History HLD, Tobacco Use, Low Back Pain    Limitations Sensory Deficits    Patient Stated Goals "get better, get back mobile and back to work"    Currently in Pain? Yes    Pain Score 9     Pain Location Hand    Pain Orientation Right    Pain Descriptors / Indicators Numbness;Tightness;Tingling    Pain Type Neuropathic pain    Pain Onset More than a month ago    Pain Frequency Constant    Aggravating Factors  nothing    Pain Relieving Factors lying on stomach and stretching arm out             Pt issued handout on HEP for intrinsic hand movement. Pt demo each   Discussed safety considerations d/t decreased sensation Rt hand and pain management - see pt instructions for details.   Encouraged discussing w/ MD another MRI of neck if symptoms are getting worse.   Pt shown button hook and how to use. Pt issued foam for writing and eating utensils. Also recommended key ring on zipper.    Overall, motoric function appears fairly intact (possible ulnar n. Function impaired) however pain and neuropathic sensation impaired.                     OT Education - 09/22/21 1133     Education Details HEP, recommendations and safety considerations for sensory changes    Person(s) Educated Patient    Methods Explanation;Demonstration;Handout;Verbal cues    Comprehension Verbalized understanding;Returned demonstration              OT Short Term Goals - 09/22/21 1218       OT SHORT TERM GOAL #1   Title Pt will be independent with HEP targeting RUE grip strength    Time 4    Period Weeks    Status On-going    Target Date 09/22/21      OT SHORT TERM GOAL #2   Title Pt will verbalize understanding of sensory strategies for increasing safety.    Time 4    Period Weeks    Status Achieved      OT SHORT TERM GOAL #3   Title Pt will demonstrate increased grip strength in RUE to 20 lbs  or greater    Baseline R 16.7, L 72.5    Time 4    Period Weeks    Status On-going      OT SHORT TERM GOAL #4   Title Pt will complete Box and Blocks with RUE and score 40 blocks or greater for increase in functional RUE use.    Baseline R 36, L 50    Time 10    Period Weeks    Status On-going               OT Long Term Goals - 08/25/21 1701       OT LONG TERM GOAL #1   Title Pt will be independent with HEP targeting RUE ROM and proximal strengthening    Time 10    Period Weeks    Status New    Target Date 11/03/21      OT LONG TERM GOAL #2   Title Pt will increase grip strength in RUE to 25 lbs or greater    Baseline R 16.7, L 72.5    Time 10    Period Weeks    Status New      OT LONG TERM GOAL #3   Title Pt will increase fine motor coordination in RUE by completing 9 hole peg test in 45 seconds or less    Baseline R 52.44s, L 26.13s    Time 10    Period Weeks    Status New      OT LONG TERM GOAL #4   Title Pt will report decrease in pain  level in RUE to no greater than 7/10 with performing HEP    Time 10    Period Weeks    Status New                   Plan - 09/22/21 1218     Clinical Impression Statement Pt encourgaed to follow up with MRI orders for cervical spine. Pt with improved RUE use but needs reminders for using functionally and with swinging naturally with ambulation. Pt with greater understanding of safety considerations    OT Occupational Profile and History Problem Focused Assessment - Including review of records relating to presenting problem    Occupational performance deficits (Please refer to evaluation for details): ADL's;IADL's;Work    Body Structure / Function / Physical Skills ADL;Decreased knowledge of use of DME;Strength;Pain;GMC;UE functional use;Proprioception;ROM;IADL;FMC;Coordination;Vision    Rehab Potential Good    Clinical Decision Making Limited treatment options, no task modification necessary    Comorbidities Affecting Occupational Performance: None    Modification or Assistance to Complete Evaluation  No modification of tasks or assist necessary to complete eval    OT Frequency 1x / week    OT Duration Other (comment)   8 visits over 10 weeks d/t any scheduling conflicts   OT Treatment/Interventions Self-care/ADL training;Aquatic Therapy;Cryotherapy;Electrical Stimulation;Paraffin;Ultrasound;Moist Heat;Fluidtherapy;Therapeutic exercise;Neuromuscular education;Energy conservation;Manual Therapy;Patient/family education;Visual/perceptual remediation/compensation;Passive range of motion;Building services engineer;Therapeutic activities;DME and/or AE instruction;Splinting    Plan RUE functional use and normal movement patterns, coordination, consider paraffin for pain management?    Consulted and Agree with Plan of Care Patient             Patient will benefit from skilled therapeutic intervention in order to improve the following deficits and impairments:   Body Structure /  Function / Physical Skills: ADL, Decreased knowledge of use of DME, Strength, Pain, GMC, UE functional use, Proprioception, ROM, IADL, FMC, Coordination, Vision  Visit Diagnosis: Pain in right arm  Other lack of coordination  Muscle weakness (generalized)    Problem List Patient Active Problem List   Diagnosis Date Noted   CSF abnormal    Weight loss    HTN (hypertension) 04/06/2021   Tobacco abuse 04/06/2021   Right sided weakness 04/06/2021    Kelli Churn, OTR/L 09/22/2021, 12:20 PM  [facility] [address] [place], [place], [address] Phone: [phone]   Fax:  [phone]  Name: Connor Andrews MRN: [id number] Date of Birth: [birth year]

## 2021-09-29 ENCOUNTER — Ambulatory Visit: Payer: Self-pay | Admitting: Occupational Therapy

## 2021-10-08 ENCOUNTER — Ambulatory Visit: Payer: Self-pay | Admitting: Occupational Therapy

## 2021-10-14 ENCOUNTER — Ambulatory Visit: Payer: Self-pay | Attending: Nurse Practitioner | Admitting: Occupational Therapy

## 2021-10-14 ENCOUNTER — Other Ambulatory Visit: Payer: Self-pay

## 2021-10-14 ENCOUNTER — Encounter: Payer: Self-pay | Admitting: Occupational Therapy

## 2021-10-14 DIAGNOSIS — M79601 Pain in right arm: Secondary | ICD-10-CM

## 2021-10-14 DIAGNOSIS — R278 Other lack of coordination: Secondary | ICD-10-CM

## 2021-10-14 DIAGNOSIS — M6281 Muscle weakness (generalized): Secondary | ICD-10-CM

## 2021-10-14 DIAGNOSIS — M25611 Stiffness of right shoulder, not elsewhere classified: Secondary | ICD-10-CM

## 2021-10-14 NOTE — Therapy (Signed)
[facility] [address] [place], [place], [address] Phone: [phone]   Fax:  [phone]  Occupational Therapy Treatment  Patient Details  Name: Connor Andrews MRN: [id number] Date of Birth: 1971-02-05 Referring Provider (OT): Narda Amber, DO   Encounter Date: 10/14/2021   OT End of Session - 10/14/21 1107     Visit Number 5    Number of Visits 9    Date for OT Re-Evaluation 11/03/21   8 visits over 10 weeks   Authorization Type Self Pay   Medicaid Pending    OT Start Time 1102    OT Stop Time 1145    OT Time Calculation (min) 43 min    Activity Tolerance Patient tolerated treatment well    Behavior During Therapy [facility] for tasks assessed/performed             Past Medical History:  Diagnosis Date   Arthritis    right hip   Hypertension    Low back pain    right side    History reviewed. No pertinent surgical history.  There were no vitals filed for this visit.   Subjective Assessment - 10/14/21 1104     Subjective  "it's the same, it's going to my left hand now - as soon as I stand up both of my arms get numb really quick"    Pertinent History HLD, Tobacco Use, Low Back Pain    Limitations Sensory Deficits    Patient Stated Goals "get better, get back mobile and back to work"    Currently in Pain? Yes    Pain Score 9     Pain Location Arm    Pain Orientation Right;Left    Pain Descriptors / Indicators Throbbing;Numbness;Tingling;Tightness    Pain Type Neuropathic pain    Pain Onset More than a month ago    Pain Frequency Constant             Education education provided re: importance of monitoring symptoms and follow up with MD re: MRI that was ordered. Pt verbalized understanding of recommendation however has not followed up with MD after multiple attempts for recommending to do so by OT. Pt reports wanting to be admitted to hospital for concern of tightness in L thoracic region and  progressing of symptoms to LUE now. Pt reports wanting to see PCP and have her send him to ED.   Medium Pegs with RUE with in hand manipulation with min difficulty and min drops. Patient copied pattern with no difficulty.  Paraffin  to RUE x 10 minutes with education re: adapted strategies for ADLs and coordination with ball and small pegs with LUE simultaneously.  Discussed no show policy with patient and encouraged patient to call and cancel appointments instead of no showing. Pt was unsure of his schedule as he cannot get into MyChart per report. Issued current calendar.                   OT Education - 10/14/21 1135     Education Details encouraged patient to follow up with MD again re: MRI and progression of symptoms to LUE.    Person(s) Educated Patient    Methods Explanation    Comprehension Verbalized understanding              OT Short Term Goals - 09/22/21 1218       OT SHORT TERM GOAL #1   Title Pt will be independent with HEP targeting RUE  grip strength    Time 4    Period Weeks    Status On-going    Target Date 09/22/21      OT SHORT TERM GOAL #2   Title Pt will verbalize understanding of sensory strategies for increasing safety.    Time 4    Period Weeks    Status Achieved      OT SHORT TERM GOAL #3   Title Pt will demonstrate increased grip strength in RUE to 20 lbs or greater    Baseline R 16.7, L 72.5    Time 4    Period Weeks    Status On-going      OT SHORT TERM GOAL #4   Title Pt will complete Box and Blocks with RUE and score 40 blocks or greater for increase in functional RUE use.    Baseline R 36, L 50    Time 10    Period Weeks    Status On-going               OT Long Term Goals - 08/25/21 1701       OT LONG TERM GOAL #1   Title Pt will be independent with HEP targeting RUE ROM and proximal strengthening    Time 10    Period Weeks    Status New    Target Date 11/03/21      OT LONG TERM GOAL #2   Title Pt will  increase grip strength in RUE to 25 lbs or greater    Baseline R 16.7, L 72.5    Time 10    Period Weeks    Status New      OT LONG TERM GOAL #3   Title Pt will increase fine motor coordination in RUE by completing 9 hole peg test in 45 seconds or less    Baseline R 52.44s, L 26.13s    Time 10    Period Weeks    Status New      OT LONG TERM GOAL #4   Title Pt will report decrease in pain level in RUE to no greater than 7/10 with performing HEP    Time 10    Period Weeks    Status New                   Plan - 10/14/21 1117     Clinical Impression Statement Pt continues to req encourgement to follow up with MRI orders for cervical spine. Pt with now progressing symptoms to LUE per report. Pt reports wanting to see PCP before going to ED to be admitted for symptoms. Continue to encourage follow up with MDs for testing as symptoms have progressed.    OT Occupational Profile and History Problem Focused Assessment - Including review of records relating to presenting problem    Occupational performance deficits (Please refer to evaluation for details): ADL's;IADL's;Work    Body Structure / Function / Physical Skills ADL;Decreased knowledge of use of DME;Strength;Pain;GMC;UE functional use;Proprioception;ROM;IADL;FMC;Coordination;Vision    Rehab Potential Good    Clinical Decision Making Limited treatment options, no task modification necessary    Comorbidities Affecting Occupational Performance: None    Modification or Assistance to Complete Evaluation  No modification of tasks or assist necessary to complete eval    OT Frequency 1x / week    OT Duration Other (comment)   8 visits over 10 weeks d/t any scheduling conflicts   OT Treatment/Interventions Self-care/ADL training;Aquatic Therapy;Cryotherapy;Electrical Stimulation;Paraffin;Ultrasound;Moist Heat;Fluidtherapy;Therapeutic exercise;Neuromuscular education;Energy conservation;Manual Therapy;Patient/family  education;Visual/perceptual remediation/compensation;Passive range of motion;Therapist, nutritional;Therapeutic activities;DME and/or AE instruction;Splinting    Plan RUE functional use and normal movement patterns, coordination, consider paraffin for pain management? ultrasound for ulnar nerve?    Consulted and Agree with Plan of Care Patient             Patient will benefit from skilled therapeutic intervention in order to improve the following deficits and impairments:   Body Structure / Function / Physical Skills: ADL, Decreased knowledge of use of DME, Strength, Pain, GMC, UE functional use, Proprioception, ROM, IADL, FMC, Coordination, Vision       Visit Diagnosis: Pain in right arm  Other lack of coordination  Stiffness of right shoulder, not elsewhere classified  Muscle weakness (generalized)    Problem List Patient Active Problem List   Diagnosis Date Noted   CSF abnormal    Weight loss    HTN (hypertension) 04/06/2021   Tobacco abuse 04/06/2021   Right sided weakness 04/06/2021    Zachery Conch, OT 10/14/2021, 11:47 AM  [facility] [address] [place], [place], [address] Phone: [phone]   Fax:  [phone]  Name: Connor Andrews MRN: [id number] Date of Birth: Nov 23, 1970

## 2021-10-15 ENCOUNTER — Encounter: Payer: Self-pay | Admitting: Nurse Practitioner

## 2021-10-15 ENCOUNTER — Ambulatory Visit (INDEPENDENT_AMBULATORY_CARE_PROVIDER_SITE_OTHER): Payer: Self-pay | Admitting: Nurse Practitioner

## 2021-10-15 VITALS — BP 136/88 | HR 89 | Wt 134.2 lb

## 2021-10-15 DIAGNOSIS — R200 Anesthesia of skin: Secondary | ICD-10-CM

## 2021-10-15 DIAGNOSIS — M25541 Pain in joints of right hand: Secondary | ICD-10-CM

## 2021-10-15 DIAGNOSIS — R202 Paresthesia of skin: Secondary | ICD-10-CM

## 2021-10-15 DIAGNOSIS — G8929 Other chronic pain: Secondary | ICD-10-CM

## 2021-10-15 DIAGNOSIS — M5441 Lumbago with sciatica, right side: Secondary | ICD-10-CM

## 2021-10-15 DIAGNOSIS — M546 Pain in thoracic spine: Secondary | ICD-10-CM

## 2021-10-15 DIAGNOSIS — I10 Essential (primary) hypertension: Secondary | ICD-10-CM

## 2021-10-15 DIAGNOSIS — F3289 Other specified depressive episodes: Secondary | ICD-10-CM

## 2021-10-15 DIAGNOSIS — E559 Vitamin D deficiency, unspecified: Secondary | ICD-10-CM

## 2021-10-15 MED ORDER — LOSARTAN POTASSIUM 25 MG PO TABS: 25.0000 mg | ORAL_TABLET | Freq: Every day | ORAL | 1 refills | Status: DC

## 2021-10-15 MED ORDER — AMLODIPINE BESYLATE 10 MG PO TABS: 10.0000 mg | ORAL_TABLET | Freq: Every day | ORAL | 1 refills | Status: DC

## 2021-10-15 MED ORDER — PREGABALIN 75 MG PO CAPS: 75.0000 mg | ORAL_CAPSULE | Freq: Two times a day (BID) | ORAL | 5 refills | Status: DC

## 2021-10-15 MED ORDER — CELECOXIB 50 MG PO CAPS: 50.0000 mg | ORAL_CAPSULE | Freq: Two times a day (BID) | ORAL | 1 refills | Status: DC

## 2021-10-15 MED ORDER — THIAMINE HCL 100 MG PO TABS: 100.0000 mg | ORAL_TABLET | Freq: Every day | ORAL | 1 refills | Status: DC

## 2021-10-15 MED ORDER — VITAMIN D (ERGOCALCIFEROL) 1.25 MG (50000 UNIT) PO CAPS: 50000.0000 [IU] | ORAL_CAPSULE | ORAL | 1 refills | Status: DC

## 2021-10-15 NOTE — Patient Instructions (Signed)
Celecoxib Capsules What is this medication? CELECOXIB (sell a KOX ib) treats mild to moderate pain, inflammation, or arthritis. It belongs to a group of medications called NSAIDs. This medicine may be used for other purposes; ask your health care provider or pharmacist if you have questions. COMMON BRAND NAME(S): Celebrex What should I tell my care team before I take this medication? They need to know if you have any of these conditions: Bleeding disorders Coronary artery bypass graft (CABG) within the past 2 weeks Heart attack Heart disease Heart failure High blood pressure High levels of potassium in the blood If you often drink alcohol Kidney disease Liver disease Low red blood cell counts Lung or breathing disease (asthma) Receiving steroids like dexamethasone or prednisone Smoke cigarettes Stomach bleeding Stomach or intestine problems Take medications that treat or prevent blood clots An unusual or allergic reaction to celecoxib, other medications, foods, dyes, or preservatives Pregnant or trying to get pregnant Breast-feeding How should I use this medication? Take this medication by mouth with water. Take it as directed on the prescription label at the same time every day. Do not cut, crush or chew this medication. Swallow the capsules whole. You may open the capsule and put the contents in 1 teaspoon of applesauce. Swallow the medication and applesauce right away. Do not chew the medication or applesauce. Keep taking it unless your care team tells you to stop. A special MedGuide will be given to you by the pharmacist with each prescription and refill. Be sure to read this information carefully each time. Talk to your care team regarding the use of this medication in children. While this medication may be prescribed for children as young as 2 years old for selected conditions, precautions do apply. Patients over 65 years of age may have a stronger reaction and need a smaller  dose. Overdosage: If you think you have taken too much of this medicine contact a poison control center or emergency room at once. NOTE: This medicine is only for you. Do not share this medicine with others. What if I miss a dose? If you miss a dose, take it as soon as you can. If it is almost time for your next dose, take only that dose. Do not take double or extra doses. What may interact with this medication? Do not take this medication with any of the following: Cidofovir Ketorolac Thioridazine This medication may also interact with the following: Alcohol Aspirin and aspirin-like medications Atomoxetine Certain medications for blood pressure, heart disease, irregular heart beat Certain medications for depression, anxiety, or psychotic disorders Certain medications that treat or prevent blood clots like warfarin, enoxaparin, dalteparin, apixaban, dabigatran, and rivaroxaban Cyclosporine Digoxin Diuretics Fluconazole Lithium Methotrexate Other NSAIDs, medications for pain and inflammation, like ibuprofen or naproxen Pemetrexed Rifampin Steroid medications like prednisone or cortisone This list may not describe all possible interactions. Give your health care provider a list of all the medicines, herbs, non-prescription drugs, or dietary supplements you use. Also tell them if you smoke, drink alcohol, or use illegal drugs. Some items may interact with your medicine. What should I watch for while using this medication? Visit your care team for regular checks on your progress. Tell your care team if your symptoms do not start to get better or if they get worse. Do not take other medications that contain aspirin, ibuprofen, or naproxen with this medication. Side effects such as stomach upset, nausea, or ulcers may be more likely to occur. Many non-prescription medications contain aspirin, ibuprofen,   or naproxen. Always read labels carefully. This medication can cause serious ulcers and  bleeding in the stomach. It can happen with no warning. Smoking, drinking alcohol, older age, and poor health can also increase risks. Call your care team right away if you have stomach pain or blood in your vomit or stool. This medication does not prevent a heart attack or stroke. This medication may increase the chance of a heart attack or stroke. The chance may increase the longer you use this medication or if you have heart disease. If you take aspirin to prevent a heart attack or stroke, talk to your care team about using this medication. Alcohol may interfere with the effect of this medication. Avoid alcoholic drinks. This medication may cause serious skin reactions. They can happen weeks to months after starting the medication. Contact your care team right away if you notice fevers or flu-like symptoms with a rash. The rash may be red or purple and then turn into blisters or peeling of the skin. Or, you might notice a red rash with swelling of the face, lips or lymph nodes in your neck or under your arms. Talk to your care team if you are pregnant before taking this medication. Taking this medication between weeks 20 and 30 of pregnancy may harm your unborn baby. Your care team will monitor you closely if you need to take it. After 30 weeks of pregnancy, do not take this medication. You may get drowsy or dizzy. Do not drive, use machinery, or do anything that needs mental alertness until you know how this medication affects you. Do not stand up or sit up quickly, especially if you are an older patient. This reduces the risk of dizzy or fainting spells. Be careful brushing or flossing your teeth or using a toothpick because you may get an infection or bleed more easily. If you have any dental work done, tell your dentist you are receiving this medication. This medication may make it more difficult to get pregnant. Talk to your care team if you are concerned about your fertility. What side effects may I  notice from receiving this medication? Side effects that you should report to your care team as soon as possible: Allergic reactions--skin rash, itching, hives, swelling of the face, lips, tongue, or throat Bleeding--bloody or black, tar-like stools, vomiting blood or brown material that looks like coffee grounds, red or dark brown urine, small red or purple spots on skin, unusual bruising or bleeding Heart attack--pain or tightness in the chest, shoulders, arms, or jaw, nausea, shortness of breath, cold or clammy skin, feeling faint or lightheaded Heart failure--shortness of breath, swelling of ankles, feet, or hands, sudden weight gain, unusual weakness or fatigue Increase in blood pressure Kidney injury--decrease in the amount of urine, swelling of the ankles, hands, or feet Liver injury--right upper belly pain, loss of appetite, nausea, light-colored stool, dark yellow or brown urine, yellowing skin or eyes, unusual weakness or fatigue Rash, fever, and swollen lymph nodes Redness, blistering, peeling, or loosening of the skin, including inside the mouth Stroke--sudden numbness or weakness of the face, arm, or leg, trouble speaking, confusion, trouble walking, loss of balance or coordination, dizziness, severe headache, change in vision Side effects that usually do not require medical attention (report to your care team if they continue or are bothersome): Headache Loss of appetite Nausea Upset stomach This list may not describe all possible side effects. Call your doctor for medical advice about side effects. You may report side   effects to FDA at 1-800-FDA-1088. Where should I keep my medication? Keep out of the reach of children and pets. Store at room temperature between 15 and 30 degrees C (59 and 86 degrees F). Get rid of any unused medication after the expiration date. To get rid of medications that are no longer needed or have expired: Take the medication to a medication take-back  program. Check with your pharmacy or law enforcement to find a location. If you cannot return the medication, check the label or package insert to see if the medication should be thrown out in the garbage or flushed down the toilet. If you are not sure, ask your care team. If it is safe to put it in the trash, empty the medication out of the container. Mix the medication with cat litter, dirt, coffee grounds, or other unwanted substance. Seal the mixture in a bag or container. Put it in the trash. NOTE: This sheet is a summary. It may not cover all possible information. If you have questions about this medicine, talk to your doctor, pharmacist, or health care provider.  2022 Elsevier/Gold Standard (2021-06-17 00:00:00)  

## 2021-10-15 NOTE — Progress Notes (Signed)
I,Yamilka J Llittleton,acting as a Education administrator for [employer], FNP.,have documented all relevant documentation on the behalf of [name], FNP,as directed by  [name], FNP while in the presence of [name], [place].   This visit occurred during the SARS-CoV-2 public health emergency.  Safety protocols were in place, including screening questions prior to the visit, additional usage of staff PPE, and extensive cleaning of exam room while observing appropriate contact time as indicated for disinfecting solutions.  Subjective:     Patient ID: Connor Andrews , male    DOB: [birth year] , 51 y.o.   MRN: [id number]   Chief Complaint  Patient presents with   Hypertension    HPI  Patient presents today for a bp check. He is still concerned about the numbness and tingling in his hands.he also reports pain on the right side. He would like to get a referral to [facility] to be evaluated for his pain. He is unable to hold a spoon and put his clothes on, mostly wearing sweat pants.    Wt Readings from Last 3 Encounters: 10/15/21 : 134 lb 3.2 oz (60.9 kg) 07/07/21 : 134 lb (60.8 kg) 06/02/21 : 133 lb 9.6 oz (60.6 kg)    Hypertension    Past Medical History:  Diagnosis Date   Arthritis    right hip   Hypertension    Low back pain    right side     Family History  Problem Relation Age of Onset   Hypertension Mother    Diabetes Mother    Hypertension Father      Current Outpatient Medications:    celecoxib (CELEBREX) 50 MG capsule, Take 1 capsule (50 mg total) by mouth 2 (two) times daily., Disp: 60 capsule, Rfl: 1   amLODipine (NORVASC) 10 MG tablet, Take 1 tablet (10 mg total) by mouth daily., Disp: 90 tablet, Rfl: 1   cyanocobalamin 1000 MCG tablet, Take 1 tablet (1,000 mcg total) by mouth daily. (Patient not taking: Reported on 10/15/2021), Disp: 30 tablet, Rfl: 1   losartan (COZAAR) 25 MG tablet, Take 1 tablet (25 mg total) by mouth daily., Disp: 30 tablet, Rfl: 1   pregabalin  (LYRICA) 75 MG capsule, Take 1 capsule (75 mg total) by mouth 2 (two) times daily., Disp: 60 capsule, Rfl: 5   thiamine 100 MG tablet, Take 1 tablet (100 mg total) by mouth daily., Disp: 30 tablet, Rfl: 1   traMADol (ULTRAM) 50 MG tablet, Take 1 tablet (50 mg total) by mouth every 6 (six) hours as needed for moderate pain. (Patient not taking: Reported on 10/15/2021), Disp: 30 tablet, Rfl: 0   [START ON 10/16/2021] Vitamin D, Ergocalciferol, (DRISDOL) 1.25 MG (50000 UNIT) CAPS capsule, Take 1 capsule (50,000 Units total) by mouth 2 (two) times a week., Disp: 24 capsule, Rfl: 1   No Known Allergies   Review of Systems  Constitutional: Negative.   Respiratory: Negative.    Cardiovascular: Negative.   Psychiatric/Behavioral: Negative.      Today's Vitals   10/15/21 0820  BP: 136/88  Pulse: 89  Weight: 134 lb 3.2 oz (60.9 kg)   Body mass index is 19.26 kg/m.   Objective:  Physical Exam Constitutional:      General: He is not in acute distress.    Appearance: Normal appearance.  Cardiovascular:     Rate and Rhythm: Normal rate and regular rhythm.     Pulses: Normal pulses.     Heart sounds: Normal heart sounds. No murmur  heard. Pulmonary:     Effort: Pulmonary effort is normal. No respiratory distress.     Breath sounds: Normal breath sounds.  Skin:    General: Skin is warm and dry.     Capillary Refill: Capillary refill takes less than 2 seconds.     Comments: There is notable bulging veins to the right lateral area at ribs 5-12 when raises right arm above head with tenderness.   Neurological:     General: No focal deficit present.     Mental Status: He is alert and oriented to person, place, and time.     Cranial Nerves: No cranial nerve deficit.     Motor: No weakness.  Psychiatric:        Mood and Affect: Mood normal.        Behavior: Behavior normal.        Thought Content: Thought content normal.        Judgment: Judgment normal.        Assessment And Plan:     1.  Essential hypertension Comments: Blood pressure is fairly controlled, unsure is he is taking daily as he is getting low. - amLODipine (NORVASC) 10 MG tablet; Take 1 tablet (10 mg total) by mouth daily.  Dispense: 90 tablet; Refill: 1 - losartan (COZAAR) 25 MG tablet; Take 1 tablet (25 mg total) by mouth daily.  Dispense: 30 tablet; Refill: 1 - BMP8+eGFR  2. Arthralgia of right hand Comments: Will increase his Lyrica to 75 mg BID.  - Ambulatory referral to Neurology - celecoxib (CELEBREX) 50 MG capsule; Take 1 capsule (50 mg total) by mouth 2 (two) times daily.  Dispense: 60 capsule; Refill: 1  3. Chronic right-sided low back pain with right-sided sciatica Comments: Continues to have pain will increase his lyrica and add cymbalta to help  - Ambulatory referral to Neurology  4. Acute right-sided thoracic back pain Comments: Pain is more on lateral side with slightly bulging vessels with tenderness - CBC with Differential/Platelet  5. Numbness and tingling in right hand He is not improving and would like a second opinion. Will refer to [place] referral to Neurology - pregabalin (LYRICA) 75 MG capsule; Take 1 capsule (75 mg total) by mouth 2 (two) times daily.  Dispense: 60 capsule; Refill: 5  6. Other depression Comments: Will change to cymbalta, f/u in 6 weeks. Depression screen was 25, his affect is flat seems to be worse - Ambulatory referral to Psychiatry - celecoxib (CELEBREX) 50 MG capsule; Take 1 capsule (50 mg total) by mouth 2 (two) times daily.  Dispense: 60 capsule; Refill: 1  7. Vitamin D deficiency Will check vitamin D level and supplement as needed.    Also encouraged to spend 15 minutes in the sun daily.  - thiamine 100 MG tablet; Take 1 tablet (100 mg total) by mouth daily.  Dispense: 30 tablet; Refill: 1 - Vitamin D, Ergocalciferol, (DRISDOL) 1.25 MG (50000 UNIT) CAPS capsule; Take 1 capsule (50,000 Units total) by mouth 2 (two) times a  week.  Dispense: 24 capsule; Refill: 1  8. Numbness and tingling in both hands Comments: Continues to have symptoms and seen Neurology in [place] but does not feel like improving. Will refer to [facility] for a second opinion.  - thiamine 100 MG tablet; Take 1 tablet (100 mg total) by mouth daily.  Dispense: 30 tablet; Refill: 1 - Vitamin D, Ergocalciferol, (DRISDOL) 1.25 MG (50000 UNIT) CAPS capsule; Take 1 capsule (50,000 Units total)  by mouth 2 (two) times a week.  Dispense: 24 capsule; Refill: 1     Patient was given opportunity to ask questions. Patient verbalized understanding of the plan and was able to repeat key elements of the plan. All questions were answered to their satisfaction.  [name], FNP   I, [name], FNP, have reviewed all documentation for this visit. The documentation on 10/15/21 for the exam, diagnosis, procedures, and orders are all accurate and complete.   IF YOU HAVE BEEN REFERRED TO A SPECIALIST, IT MAY TAKE 1-2 WEEKS TO SCHEDULE/PROCESS THE REFERRAL. IF YOU HAVE NOT HEARD FROM US/SPECIALIST IN TWO WEEKS, PLEASE GIVE [place] A CALL AT [phone].   THE PATIENT IS ENCOURAGED TO PRACTICE SOCIAL DISTANCING DUE TO THE COVID-19 PANDEMIC.

## 2021-10-16 LAB — CBC WITH DIFFERENTIAL/PLATELET
Basophils Absolute: 0.1 10*3/uL (ref 0.0–0.2)
Basos: 1 %
EOS (ABSOLUTE): 0.1 10*3/uL (ref 0.0–0.4)
Eos: 2 %
Hematocrit: 43.9 % (ref 37.5–51.0)
Hemoglobin: 14.6 g/dL (ref 13.0–17.7)
Immature Grans (Abs): 0 10*3/uL (ref 0.0–0.1)
Immature Granulocytes: 0 %
Lymphocytes Absolute: 1.3 10*3/uL (ref 0.7–3.1)
Lymphs: 23 %
MCH: 30.9 pg (ref 26.6–33.0)
MCHC: 33.3 g/dL (ref 31.5–35.7)
MCV: 93 fL (ref 79–97)
Monocytes Absolute: 0.4 10*3/uL (ref 0.1–0.9)
Monocytes: 7 %
Neutrophils Absolute: 3.8 10*3/uL (ref 1.4–7.0)
Neutrophils: 67 %
Platelets: 280 10*3/uL (ref 150–450)
RBC: 4.72 x10E6/uL (ref 4.14–5.80)
RDW: 12 % (ref 11.6–15.4)
WBC: 5.7 10*3/uL (ref 3.4–10.8)

## 2021-10-16 LAB — BMP8+EGFR
BUN/Creatinine Ratio: 16 (ref 9–20)
BUN: 13 mg/dL (ref 6–24)
CO2: 22 mmol/L (ref 20–29)
Calcium: 9.5 mg/dL (ref 8.7–10.2)
Chloride: 99 mmol/L (ref 96–106)
Creatinine, Ser: 0.83 mg/dL (ref 0.76–1.27)
Glucose: 100 mg/dL — ABNORMAL HIGH (ref 70–99)
Potassium: 3.9 mmol/L (ref 3.5–5.2)
Sodium: 138 mmol/L (ref 134–144)
eGFR: 106 mL/min/{1.73_m2} (ref >59)

## 2021-10-21 ENCOUNTER — Ambulatory Visit: Payer: Self-pay | Admitting: Occupational Therapy

## 2021-11-11 ENCOUNTER — Telehealth: Payer: Self-pay

## 2021-11-11 NOTE — Telephone Encounter (Signed)
Pt called about vm left on 11/11/21. He states neurology specialist has not gotten back to him about an appointment,  payment for appointment had already been received by them. Pt was gieven phone number to [facility]. He will give them a call to schedule appointment, referral placed on 10/15/2021.

## 2021-11-19 ENCOUNTER — Ambulatory Visit (INDEPENDENT_AMBULATORY_CARE_PROVIDER_SITE_OTHER): Payer: Self-pay | Admitting: Nurse Practitioner

## 2021-11-19 ENCOUNTER — Encounter: Payer: Self-pay | Admitting: Nurse Practitioner

## 2021-11-19 ENCOUNTER — Other Ambulatory Visit: Payer: Self-pay

## 2021-11-19 VITALS — BP 144/96 | HR 94 | Temp 99.1°F | Ht 70.0 in | Wt 132.8 lb

## 2021-11-19 DIAGNOSIS — R200 Anesthesia of skin: Secondary | ICD-10-CM

## 2021-11-19 DIAGNOSIS — I10 Essential (primary) hypertension: Secondary | ICD-10-CM

## 2021-11-19 DIAGNOSIS — R202 Paresthesia of skin: Secondary | ICD-10-CM

## 2021-11-19 DIAGNOSIS — M25541 Pain in joints of right hand: Secondary | ICD-10-CM

## 2021-11-19 NOTE — Progress Notes (Signed)
I,Katawbba Wiggins,acting as a Neurosurgeon for [employer], FNP.,have documented all relevant documentation on the behalf of Arnette Felts, FNP,as directed by  Arnette Felts, FNP while in the presence of Arnette Felts, FNP.   This visit occurred during the SARS-CoV-2 public health emergency.  Safety protocols were in place, including screening questions prior to the visit, additional usage of staff PPE, and extensive cleaning of exam room while observing appropriate contact time as indicated for disinfecting solutions.  Subjective:     Patient ID: Connor Andrews , male    DOB: 05-06-1971 , 51 y.o.   MRN: [id number]   Chief Complaint  Patient presents with   Hand Pain    HPI  The patient is here today for a follow-up on his hand pain. He has an appt with [facility] in April, he is on the cancellation list. When he wakes up his hands are sleep. When he stands up he has numbness. He does not have any braces.     Past Medical History:  Diagnosis Date   Arthritis    right hip   Hypertension    Low back pain    right side     Family History  Problem Relation Age of Onset   Hypertension Mother    Diabetes Mother    Hypertension Father      Current Outpatient Medications:    amLODipine (NORVASC) 10 MG tablet, Take 1 tablet (10 mg total) by mouth daily., Disp: 90 tablet, Rfl: 1   celecoxib (CELEBREX) 50 MG capsule, Take 1 capsule (50 mg total) by mouth 2 (two) times daily., Disp: 60 capsule, Rfl: 1   cyanocobalamin 1000 MCG tablet, Take 1 tablet (1,000 mcg total) by mouth daily., Disp: 30 tablet, Rfl: 1   losartan (COZAAR) 25 MG tablet, Take 1 tablet (25 mg total) by mouth daily., Disp: 30 tablet, Rfl: 1   pregabalin (LYRICA) 75 MG capsule, Take 1 capsule (75 mg total) by mouth 2 (two) times daily., Disp: 60 capsule, Rfl: 5   thiamine 100 MG tablet, Take 1 tablet (100 mg total) by mouth daily., Disp: 30 tablet, Rfl: 1   traMADol (ULTRAM) 50 MG tablet, Take 1 tablet (50 mg total) by  mouth every 6 (six) hours as needed for moderate pain., Disp: 30 tablet, Rfl: 0   Vitamin D, Ergocalciferol, (DRISDOL) 1.25 MG (50000 UNIT) CAPS capsule, Take 1 capsule (50,000 Units total) by mouth 2 (two) times a week., Disp: 24 capsule, Rfl: 1   No Known Allergies   Review of Systems  Constitutional: Negative.   Respiratory: Negative.    Cardiovascular: Negative.   Neurological:  Negative for dizziness, facial asymmetry and headaches.  Psychiatric/Behavioral: Negative.      Today's Vitals   11/19/21 1625  BP: (!) 144/96  Pulse: 94  Temp: 99.1 F (37.3 C)  Weight: 132 lb 12.8 oz (60.2 kg)  Height: 5\' 10"  (1.778 m)   Body mass index is 19.05 kg/m.  Wt Readings from Last 3 Encounters:  11/19/21 132 lb 12.8 oz (60.2 kg)  10/15/21 134 lb 3.2 oz (60.9 kg)  07/07/21 134 lb (60.8 kg)    BP Readings from Last 3 Encounters:  11/19/21 (!) 144/96  10/15/21 136/88  07/07/21 (!) 154/103    Objective:  Physical Exam Vitals reviewed.  Constitutional:      Appearance: Normal appearance.  Cardiovascular:     Rate and Rhythm: Normal rate and regular rhythm.     Pulses: Normal pulses.  Heart sounds: Normal heart sounds. No murmur heard. Pulmonary:     Effort: Pulmonary effort is normal. No respiratory distress.     Breath sounds: Normal breath sounds. No wheezing.  Neurological:     Mental Status: He is alert.        Assessment And Plan:     1. Arthralgia of right hand Comments: He has an appt with Neuro at [facility] in April.   2. Numbness and tingling in both hands Comments: Worse at night and when stands up, will give Rx for wrist splints with thumb.  3. Essential hypertension Comments: Blood pressure is elevated he has not taken his medications today, advised to take medications when she gets home. May also be related to pain.     Patient was given opportunity to ask questions. Patient verbalized understanding of the plan and was able to repeat key elements of the  plan. All questions were answered to their satisfaction.  Arnette Felts, FNP   I, Arnette Felts, FNP, have reviewed all documentation for this visit. The documentation on 11/19/21 for the exam, diagnosis, procedures, and orders are all accurate and complete.   IF YOU HAVE BEEN REFERRED TO A SPECIALIST, IT MAY TAKE 1-2 WEEKS TO SCHEDULE/PROCESS THE REFERRAL. IF YOU HAVE NOT HEARD FROM US/SPECIALIST IN TWO WEEKS, PLEASE GIVE [place] A CALL AT [phone].   THE PATIENT IS ENCOURAGED TO PRACTICE SOCIAL DISTANCING DUE TO THE COVID-19 PANDEMIC.

## 2022-01-06 ENCOUNTER — Telehealth (HOSPITAL_COMMUNITY): Payer: Self-pay | Admitting: Professional

## 2022-01-06 ENCOUNTER — Other Ambulatory Visit: Payer: Self-pay

## 2022-01-06 ENCOUNTER — Ambulatory Visit (INDEPENDENT_AMBULATORY_CARE_PROVIDER_SITE_OTHER): Payer: No Payment, Other | Admitting: Licensed Clinical Social Worker

## 2022-01-06 ENCOUNTER — Encounter (HOSPITAL_COMMUNITY): Payer: Self-pay | Admitting: Licensed Clinical Social Worker

## 2022-01-06 DIAGNOSIS — F332 Major depressive disorder, recurrent severe without psychotic features: Secondary | ICD-10-CM

## 2022-01-06 DIAGNOSIS — F411 Generalized anxiety disorder: Secondary | ICD-10-CM | POA: Insufficient documentation

## 2022-01-06 NOTE — Progress Notes (Signed)
Comprehensive Clinical Assessment (CCA) Note ? ?01/06/2022 ?Connor Andrews ?[id number] ? ?Chief Complaint:  ?Chief Complaint  ?Patient presents with  ? Depression  ? Anxiety  ? ?Visit Diagnosis: Major Depression and GAD  ? ? ?Client is a 51 year old male. Client is referred by neurology for a depression and anxiety.   ?Client states mental health symptoms as evidenced by:  ? ? ?Depression Difficulty Concentrating; Fatigue; Hopelessness; Increase/decrease in appetite; Irritability; Sleep (too much or little); Tearfulness; Weight gain/loss; Worthlessness Difficulty Concentrating; Fatigue; Hopelessness; Increase/decrease in appetite; Irritability; Sleep (too much or little); Tearfulness; Weight gain/loss; Worthlessness  ?Duration of Depressive Symptoms Greater than two weeks Greater than two weeks  ?    ?Anxiety Tension; Worrying; Fatigue; Irritability Tension; Worrying; Fatigue; Irritability  ?Psychosis HallucinationsPsychosis. Hallucinations. The comment is Pt reports that pain medications can make him hear things at times. Taken on 01/06/22 1316 HallucinationsPsychosis. Hallucinations. The comment is Pt reports that pain medications can make him hear things at times. Last Filed Value  ?Duration of Psychotic Symptoms Greater than six months Greater than six months  ?Trauma None None  ?Obsessions None None  ?Compulsions None None  ?Inattention None None  ?Hyperactivity/Impulsivity None None  ?Oppositional/Defiant Behaviors None None  ?Emotional Irregularity Chronic feelings of emptiness Chronic feelings of emptiness  ? ?Client denies suicidal and homicidal ideations at this time   ?Client denies hallucinations and delusions at this time  ? ?Client was screened for the following SDOH: Smoking, financials, food, transportation, exercise, stress\tension, social interaction, depression, and housing. ? ?Assessment Information that integrates subjective and objective details with a therapist's professional interpretation:   ? ? Patient was alert and oriented x5.  Patient presented today with his daughter Connor Andrews.  Patient presented with anxious and depressed mood\affect.  He engaged well in therapy session and was pleasant, cooperative, and good eye contact. ? Binyamin comes in today as a referral from his neurologist.  Patient reports for the past 2 years he has had some type of pain in his arms, hands, and back.  Patient reports this as an aching/burning pain.  Patient reports that he has been seen by 1 neurologist and is following up with another neurologist as no diagnosis has been found.  Patient reports that his depression and anxiety has increased as of late due to isolation, lack of motivation, worthlessness, tension, and worry.  Patient reports that he is currently living with his parents with little to no financial support given as he cannot work.  Patient reports he used to enjoy things such as working on cars but because of his onset of numbness in his back, arms, and hands he has not been able to engage in working on cars anymore.  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  But has not worked in the past 2 years.  Patient reports good support systems by his 2 daughters, brother, sister, and parents.  LCSW advised patient to plan of action which was referral to partial hospitalization program, and referral to medication management. ? ?Client meets criteria for: GAD depression and generalized anxiety disorder ? ?Client states use of the following substances: None reported ? ? ? ? ?Treatment recommendations are include plan: Referral to PHP  ? ? ? ?Client was in agreement with treatment recommendations. ? ?CCA Screening, Triage and Referral (STR) ? ?Patient Reported Information ? ?Referral name: [facility] ? ? ?Whom do you see for routine medical problems? Primary Care ? ?P[facility]Facility [facility] ? ?Practice/Facility Phone  Number: No data recorded ?Name of Contact: [facility] ? ?What Do  You Feel Would Help You the Most Today? Treatment for Depression or other mood problem ? ? ?Have You Recently Been in Any Inpatient Treatment (Hospital/Detox/Crisis Center/28-Day Program)? No ? ?Have You Ever Received Services From [facility] Before? Yes ? ?Who Do You See at [facility] ? ?Have You Recently Had Any Thoughts About Hurting Yourself? Yes ? ?Are You Planning to Commit Suicide/Harm Yourself At This time? No ? ?Have you Recently Had Thoughts About [place]? No ? ? ?Have You Used Any Alcohol or Drugs in the Past 24 Hours? No ? ? ?Do You Currently Have a Therapist/Psychiatrist? No ? ? ?Have You Been Recently Discharged From Any Office Practice or Programs? No ? ? ?  ?CCA Screening Triage Referral Assessment ?Type of Contact: Face-to-Face ? ? ?Collateral Involvement: Connor Andrews Daughter ? ? ?Is CPS involved or ever been involved? Never ? ?Is APS involved or ever been involved? Never ? ? ?Patient Determined To Be At Risk for Harm To Self or Others Based on Review of Patient Reported Information or Presenting Complaint? No ? ? ?Location of Assessment: [facility] ? ? ?Does Patient Present under Involuntary Commitment? No ? ? ?[place] of Residence: [place] ? ? ? ? ?CCA Biopsychosocial ?Intake/Chief Complaint:  Depression and anxiety. Pt reports that 2 years onset for numbness in back, arms, hands. He reports that he has seen multiple neurologist but not Dx has been found. Pt reports because of the pain he has not been able to work. ? ?Current Symptoms/Problems: isolation, lack of motivation, anger, and irrability ? ? ?Patient Reported Schizophrenia/Schizoaffective Diagnosis in Past: No ? ? ?Strengths: family support ? ?Preferences: medications ? ?Abilities: Pt reports that he like to work on cars but due to to un-Dx injury he has not done that in years. ? ? ?Type of Services Patient Feels are Needed: medication mgnt ? ? ?Mental Health Symptoms ?Depression:   ?Difficulty  Concentrating; Fatigue; Hopelessness; Increase/decrease in appetite; Irritability; Sleep (too much or little); Tearfulness; Weight gain/loss; Worthlessness ?  ?Duration of Depressive symptoms:  ?Greater than two weeks ?  ?Mania:  No data recorded  ?Anxiety:    ?Tension; Worrying; Fatigue; Irritability ?  ?Psychosis:   ?Hallucinations (Pt reports that pain medications can make him hear things at times) ?  ?Duration of Psychotic symptoms:  ?Greater than six months ?  ?Trauma:   ?None ?  ?Obsessions:   ?None ?  ?Compulsions:   ?None ?  ?Inattention:   ?None ?  ?Hyperactivity/Impulsivity:   ?None ?  ?Oppositional/Defiant Behaviors:   ?None ?  ?Emotional Irregularity:   ?Chronic feelings of emptiness ?  ?Other Mood/Personality Symptoms:  No data recorded  ? ?Mental Status Exam ?Appearance and self-care  ?Stature:   ?Average ?  ?Weight:   ?Average weight ?  ?Clothing:   ?Casual ?  ?Grooming:   ?Normal ?  ?Cosmetic use:  No data recorded  ?Posture/gait:   ?Normal ?  ?Motor activity:   ?Not Remarkable ?  ?Sensorium  ?Attention:   ?Normal ?  ?Concentration:   ?Normal ?  ?Orientation:   ?X5 ?  ?Recall/memory:   ?Normal ?  ?Affect and Mood  ?Affect:   ?Anxious ?  ?Mood:   ?Anxious; Depressed ?  ?Relating  ?Eye contact:   ?None ?  ?Facial expression:   ?Anxious; Depressed ?  ?Attitude toward examiner:  No data recorded  ?Thought and Language  ?  Speech flow:  ?Clear and Coherent ?  ?Thought content:   ?Appropriate to Mood and Circumstances ?  ?Preoccupation:   ?None ?  ?Hallucinations:   ?Auditory ?  ?Organization:  No data recorded  ?Executive Functions  ?Fund of Knowledge:   ?[place] ?  ?Intelligence:   ?Average ?  ?Abstraction:   ?Functional ?  ?Judgement:   ?Fair ?  ?Reality Testing:   ?Adequate ?  ?Insight:   ?Fair ?  ?Decision Making:   ?Normal ?  ?Social Functioning  ?Social Maturity:   ?Isolates ?  ?Social Judgement:   ?Normal ?  ?Stress  ?Stressors:   ?Illness; Work; Museum/gallery curator; Brewing technologist; Housing; Family conflict ?   ?Coping Ability:   ?Normal ?  ?Skill Deficits:   ?Interpersonal; Activities of daily living ?  ?Supports:   ?Family ?  ? ? ?Religion: ?Religion/Spirituality ?Are You A Religious Person?: Yes ?What is Your Re

## 2022-02-19 ENCOUNTER — Encounter (HOSPITAL_COMMUNITY): Payer: Self-pay | Admitting: Student in an Organized Health Care Education/Training Program

## 2022-02-19 ENCOUNTER — Ambulatory Visit (INDEPENDENT_AMBULATORY_CARE_PROVIDER_SITE_OTHER): Payer: No Payment, Other | Admitting: Student in an Organized Health Care Education/Training Program

## 2022-02-19 VITALS — BP 151/108 | HR 68 | Ht 69.0 in | Wt 127.0 lb

## 2022-02-19 DIAGNOSIS — F411 Generalized anxiety disorder: Secondary | ICD-10-CM | POA: Diagnosis not present

## 2022-02-19 DIAGNOSIS — F332 Major depressive disorder, recurrent severe without psychotic features: Secondary | ICD-10-CM

## 2022-02-19 DIAGNOSIS — F4310 Post-traumatic stress disorder, unspecified: Secondary | ICD-10-CM

## 2022-02-19 MED ORDER — SERTRALINE HCL 25 MG PO TABS
25.0000 mg | ORAL_TABLET | Freq: Every day | ORAL | 0 refills | Status: DC
Start: 2022-02-19 — End: 2022-03-19

## 2022-02-19 MED ORDER — OLANZAPINE 2.5 MG PO TABS: 2.5000 mg | ORAL_TABLET | Freq: Every day | ORAL | 0 refills | Status: DC

## 2022-02-19 NOTE — Progress Notes (Signed)
Psychiatric Initial Adult Assessment  ? ?Patient Identification: Connor Andrews ?MRN:  [id number] ?Date of Evaluation:  02/19/2022 ?Referral Source: Penelope GalasMoore, Janice FNP ?Chief Complaint:  No chief complaint on file. ? ?Visit Diagnosis:  ?  ICD-10-CM   ?1. Severe episode of recurrent major depressive disorder, without psychotic features (HCC)  F33.2 sertraline (ZOLOFT) 25 MG tablet  ?  OLANZapine (ZYPREXA) 2.5 MG tablet  ?  ?2. GAD (generalized anxiety disorder)  F41.1 sertraline (ZOLOFT) 25 MG tablet  ?  ?3. PTSD (post-traumatic stress disorder)  F43.10   ?  ? ? ?History of Present Illness:   ?Connor Andrews is a 51 yr old male who presents to establish care and for medication management.  No previous PPHx. ? ?He reports his symptoms started when his pain/numbness started.  He reports that about 2 years ago he started having numbness and pain in his Right arm/hand.  He reports that over time he has had pain in his back, Left arm, and legs.  He reports that it is so overwhelming and frustrating.  He reports working since he was 15 and now cannot due to pain/inability.  He reports he has trouble feeding himself and combined with his decreased appetite has lost about 35 lbs.  He reports trouble taking care of himself due to his physical limits.  He reports that he has been to multiple neurologists and keeps having to wait multiple months for new appointments when he is referred somewhere new.  He reports he had some relief from his symptoms when he was hospitalized and given IV steroids but other than this nothing has been helpful. ? ?He reports no past diagnosis' and not being on any psychiatric medications in the past.  He reports no psychiatric hospitalizations. He reports no Suicide attempts or self injurious behaviors.  He reports medical history of HTN.  He reports no history of head trauma or seizures.  He reports NKDA. ? ?He reports he currently lives with his elderly parents (mid 2680's).  He reports having a 52 yr  old daughter and 52 yr old son (in college).  He is unable to work due to his pain/numbness and has been attempting to get onto disability.  He reports drinking 1 beer a night with dinner.  He reports he quit smoking when his symptoms started due to inability to hold them and fear of burning his hands due to numbness.  He reports no illicit substance use. ? ?Discussed if he would be interested in starting medications and he reported he is.  Discussed starting Zoloft for his depression and anxiety.  Also discussed starting Zyprexa to help boost the effectiveness of Zoloft and to increase his appetite.  Discussed potential side effects and discussed he needed to contact the office if he had any abnormal muscle movements.  Discussed the need to obtain labwork (CMP, CBC, A1c, Lipid Panel) and EKG at his next PCP appointment.  Discussed with him what to do in the event of a future crisis.  D[facility], [facility], go to the nearest ED, or call [phone].   He reported understanding and had no concerns. He reports his chronic pain and numbness but otherwise has no other concerns at present. ? ? ?Associated Signs/Symptoms: ?Depression Symptoms:  depressed mood, ?anhedonia, ?insomnia, ?fatigue, ?feelings of worthlessness/guilt, ?hopelessness, ?recurrent thoughts of death, ?anxiety, ?panic attacks, ?loss of energy/fatigue, ?weight loss, ?decreased appetite, ?(Hypo) Manic Symptoms:   Reports none ?Anxiety Symptoms:  Excessive Worry, ?  Panic Symptoms, ?Psychotic Symptoms:   Reports none ?PTSD Symptoms: ?Re-experiencing:  Flashbacks ?Intrusive Thoughts ?Nightmares ?Hypervigilance:  Yes ?Hyperarousal:  Increased Startle Response ? ?Past Psychiatric History: No previous diagnosis's, medications, or hospitalizations ? ?Previous Psychotropic Medications: No  ? ?Substance Abuse History in the last 12 months:  No. ? ?Consequences of Substance Abuse: ?NA ? ?Past Medical History:  ?Past Medical History:   ?Diagnosis Date  ? 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 Hypertension   ? Low back pain   ? right side  ? History reviewed. No pertinent surgical history. ? ?Family Psychiatric History: No known Diagnosis', Substance Abuse, or Suicides. ? ?Family History:  ?Family History  ?Problem Relation Age of Onset  ? Hypertension Mother   ? Diabetes Mother   ? Hypertension Father   ? ? ?Social History:   ?Social History  ? ?Socioeconomic History  ? Marital status: Married  ?  Spouse name: Duwayne Heck  ? Number of children: 2  ? Years of education: Not on file  ? Highest education level: GED or equivalent  ?Occupational History  ?  Comment: 07/22/20 out of work d/t symptoms  ?Tobacco Use  ? Smoking status: Every Day  ?  Packs/day: 0.25  ?  Types: Cigarettes  ? Smokeless tobacco: Never  ? Tobacco comments:  ?  07/22/20 1 pk a week   ?  07/07/2021: Patient has been cutting down   ?  10/15/2021 - unable to hold the cigarette so he is cutting back  ?Vaping Use  ? Vaping Use: Never used  ?Substance and Sexual Activity  ? Alcohol use: Not Currently  ?  Comment: weekends only  ? Drug use: No  ? Sexual activity: Yes  ?  Partners: Female  ?Other Topics Concern  ? Not on file  ?Social History Narrative  ? Lives with wife  ? Right handed   ? Lives in a one story home   ? ?Social Determinants of Health  ? ?Financial Resource Strain: High Risk  ? Difficulty of Paying Living Expenses: Very hard  ?Food Insecurity: Food Insecurity Present  ? Worried About Programme researcher, broadcasting/film/video in the Last Year: Often true  ? Ran Out of Food in the Last Year: Often true  ?Transportation Needs: Unmet Transportation Needs  ? Lack of Transportation (Medical): Yes  ? Lack of Transportation (Non-Medical): Yes  ?Physical Activity: Inactive  ? Days of Exercise per Week: 0 days  ? Minutes of Exercise per Session: 0 min  ?Stress: Stress Concern Present  ? Feeling of Stress : Very much  ?Social Connections: Socially Isolated  ? Frequency of Communication with Friends and Family: Once a  week  ? Frequency of Social Gatherings with Friends and Family: Never  ? Attends Religious Services: Never  ? Active Member of Clubs or Organizations: No  ? Attends Banker Meetings: Never  ? Marital Status: Separated  ? ? ?Additional Social History: Lives with parents.  Not currently working.   ? ?Allergies:  No Known Allergies ? ?Metabolic Disorder Labs: ?Lab Results  ?Component Value Date  ? HGBA1C 4.6 (L) 05/31/2019  ? ?No results found for: PROLACTIN ?No results found for: CHOL, TRIG, HDL, CHOLHDL, VLDL, LDLCALC ?Lab Results  ?Component Value Date  ? TSH 1.071 04/06/2021  ? ? ?Therapeutic Level Labs: ?No results found for: LITHIUM ?No results found for: CBMZ ?No results found for: VALPROATE ? ?Current Medications: ?Current Outpatient Medications  ?Medication Sig Dispense Refill  ? amLODipine (NORVASC) 10 MG  tablet Take 1 tablet (10 mg total) by mouth daily. 90 tablet 1  ? celecoxib (CELEBREX) 50 MG capsule Take 1 capsule (50 mg total) by mouth 2 (two) times daily. 60 capsule 1  ? cyanocobalamin 1000 MCG tablet Take 1 tablet (1,000 mcg total) by mouth daily. 30 tablet 1  ? losartan (COZAAR) 25 MG tablet Take 1 tablet (25 mg total) by mouth daily. 30 tablet 1  ? OLANZapine (ZYPREXA) 2.5 MG tablet Take 1 tablet (2.5 mg total) by mouth at bedtime. 30 tablet 0  ? pregabalin (LYRICA) 75 MG capsule Take 1 capsule (75 mg total) by mouth 2 (two) times daily. 60 capsule 5  ? sertraline (ZOLOFT) 25 MG tablet Take 1 tablet (25 mg total) by mouth daily. 30 tablet 0  ? thiamine 100 MG tablet Take 1 tablet (100 mg total) by mouth daily. 30 tablet 1  ? traMADol (ULTRAM) 50 MG tablet Take 1 tablet (50 mg total) by mouth every 6 (six) hours as needed for moderate pain. 30 tablet 0  ? Vitamin D, Ergocalciferol, (DRISDOL) 1.25 MG (50000 UNIT) CAPS capsule Take 1 capsule (50,000 Units total) by mouth 2 (two) times a week. 24 capsule 1  ? ?No current facility-administered medications for this visit.   ? ? ?Musculoskeletal: ?Strength & Muscle Tone: decreased ?Gait & Station: unsteady ?Patient leans: N/A ? ?Psychiatric Specialty Exam: ?Review of Systems  ?Respiratory:  Negative for shortness of breath.   ?Cardiovascular:  Negativ

## 2022-03-19 ENCOUNTER — Ambulatory Visit (INDEPENDENT_AMBULATORY_CARE_PROVIDER_SITE_OTHER): Payer: No Payment, Other | Admitting: Student in an Organized Health Care Education/Training Program

## 2022-03-19 ENCOUNTER — Ambulatory Visit (INDEPENDENT_AMBULATORY_CARE_PROVIDER_SITE_OTHER): Payer: Self-pay | Admitting: Nurse Practitioner

## 2022-03-19 ENCOUNTER — Telehealth: Payer: Self-pay | Admitting: *Deleted

## 2022-03-19 ENCOUNTER — Encounter (HOSPITAL_COMMUNITY): Payer: Self-pay | Admitting: Student in an Organized Health Care Education/Training Program

## 2022-03-19 VITALS — BP 138/70 | HR 78 | Temp 98.5°F | Ht 69.0 in | Wt 130.0 lb

## 2022-03-19 DIAGNOSIS — F3289 Other specified depressive episodes: Secondary | ICD-10-CM

## 2022-03-19 DIAGNOSIS — F411 Generalized anxiety disorder: Secondary | ICD-10-CM

## 2022-03-19 DIAGNOSIS — F332 Major depressive disorder, recurrent severe without psychotic features: Secondary | ICD-10-CM

## 2022-03-19 DIAGNOSIS — R202 Paresthesia of skin: Secondary | ICD-10-CM

## 2022-03-19 DIAGNOSIS — R200 Anesthesia of skin: Secondary | ICD-10-CM

## 2022-03-19 DIAGNOSIS — I10 Essential (primary) hypertension: Secondary | ICD-10-CM

## 2022-03-19 MED ORDER — OLANZAPINE 2.5 MG PO TABS: 2.5000 mg | ORAL_TABLET | Freq: Every day | ORAL | 0 refills | Status: DC

## 2022-03-19 MED ORDER — SERTRALINE HCL 50 MG PO TABS: 50.0000 mg | ORAL_TABLET | Freq: Every day | ORAL | 0 refills | Status: DC

## 2022-03-19 NOTE — Chronic Care Management (AMB) (Unsigned)
  Care Management   Outreach Note  03/19/2022 Name: Connor Andrews MRN: [id number] DOB: [birth year]  Referred by: Arnette Felts, FNP Reason for referral : Care Coordination (Initial outreach to schedule referral with BSW)   An unsuccessful telephone outreach was attempted today. The patient was referred to the case management team for assistance with care management and care coordination.   Follow Up Plan:  A HIPAA compliant phone message was left for the patient providing contact information and requesting a return call.  The care management team will reach out to the patient again over the next 5 days.  If patient returns call to provider office, please advise to call Embedded Care Management Care Guide Misty Stanley* at [phone]  Care Guide, Embedded Care Coordination [facility]  Care Management  Direct Dial: [phone]

## 2022-03-19 NOTE — Patient Instructions (Addendum)
Hypertension, Adult High blood pressure (hypertension) is when the force of blood pumping through the arteries is too strong. The arteries are the blood vessels that carry blood from the heart throughout the body. Hypertension forces the heart to work harder to pump blood and may cause arteries to become narrow or stiff. Untreated or uncontrolled hypertension can lead to a heart attack, heart failure, a stroke, kidney disease, and other problems. A blood pressure reading consists of a higher number over a lower number. Ideally, your blood pressure should be below 120/80. The first ("top") number is called the systolic pressure. It is a measure of the pressure in your arteries as your heart beats. The second ("bottom") number is called the diastolic pressure. It is a measure of the pressure in your arteries as the heart relaxes. What are the causes? The exact cause of this condition is not known. There are some conditions that result in high blood pressure. What increases the risk? Certain factors may make you more likely to develop high blood pressure. Some of these risk factors are under your control, including: Smoking. Not getting enough exercise or physical activity. Being overweight. Having too much fat, sugar, calories, or salt (sodium) in your diet. Drinking too much alcohol. Other risk factors include: Having a personal history of heart disease, diabetes, high cholesterol, or kidney disease. Stress. Having a family history of high blood pressure and high cholesterol. Having obstructive sleep apnea. Age. The risk increases with age. What are the signs or symptoms? High blood pressure may not cause symptoms. Very high blood pressure (hypertensive crisis) may cause: Headache. Fast or irregular heartbeats (palpitations). Shortness of breath. Nosebleed. Nausea and vomiting. Vision changes. Severe chest pain, dizziness, and seizures. How is this diagnosed? This condition is diagnosed by  measuring your blood pressure while you are seated, with your arm resting on a flat surface, your legs uncrossed, and your feet flat on the floor. The cuff of the blood pressure monitor will be placed directly against the skin of your upper arm at the level of your heart. Blood pressure should be measured at least twice using the same arm. Certain conditions can cause a difference in blood pressure between your right and left arms. If you have a high blood pressure reading during one visit or you have normal blood pressure with other risk factors, you may be asked to: Return on a different day to have your blood pressure checked again. Monitor your blood pressure at home for 1 week or longer. If you are diagnosed with hypertension, you may have other blood or imaging tests to help your health care provider understand your overall risk for other conditions. How is this treated? This condition is treated by making healthy lifestyle changes, such as eating healthy foods, exercising more, and reducing your alcohol intake. You may be referred for counseling on a healthy diet and physical activity. Your health care provider may prescribe medicine if lifestyle changes are not enough to get your blood pressure under control and if: Your systolic blood pressure is above 130. Your diastolic blood pressure is above 80. Your personal target blood pressure may vary depending on your medical conditions, your age, and other factors. Follow these instructions at home: Eating and drinking  Eat a diet that is high in fiber and potassium, and low in sodium, added sugar, and fat. An example of this eating plan is called the DASH diet. DASH stands for Dietary Approaches to Stop Hypertension. To eat this way: Eat   plenty of fresh fruits and vegetables. Try to fill one half of your plate at each meal with fruits and vegetables. Eat whole grains, such as whole-wheat pasta, brown rice, or whole-grain bread. Fill about one  fourth of your plate with whole grains. Eat or drink low-fat dairy products, such as skim milk or low-fat yogurt. Avoid fatty cuts of meat, processed or cured meats, and poultry with skin. Fill about one fourth of your plate with lean proteins, such as fish, chicken without skin, beans, eggs, or tofu. Avoid pre-made and processed foods. These tend to be higher in sodium, added sugar, and fat. Reduce your daily sodium intake. Many people with hypertension should eat less than 1,500 mg of sodium a day. Do not drink alcohol if: Your health care provider tells you not to drink. You are pregnant, may be pregnant, or are planning to become pregnant. If you drink alcohol: Limit how much you have to: 0-1 drink a day for women. 0-2 drinks a day for men. Know how much alcohol is in your drink. In the [place] one drink equals one 12 oz bottle of beer (355 mL), one 5 oz glass of wine (148 mL), or one 1 oz glass of hard liquor (44 mL). Lifestyle  Work with your health care provider to maintain a healthy body weight or to lose weight. Ask what an ideal weight is for you. Get at least 30 minutes of exercise that causes your heart to beat faster (aerobic exercise) most days of the week. Activities may include walking, swimming, or biking. Include exercise to strengthen your muscles (resistance exercise), such as Pilates or lifting weights, as part of your weekly exercise routine. Try to do these types of exercises for 30 minutes at least 3 days a week. Do not use any products that contain nicotine or tobacco. These products include cigarettes, chewing tobacco, and vaping devices, such as e-cigarettes. If you need help quitting, ask your health care provider. Monitor your blood pressure at home as told by your health care provider. Keep all follow-up visits. This is important. Medicines Take over-the-counter and prescription medicines only as told by your health care provider. Follow directions carefully. Blood  pressure medicines must be taken as prescribed. Do not skip doses of blood pressure medicine. Doing this puts you at risk for problems and can make the medicine less effective. Ask your health care provider about side effects or reactions to medicines that you should watch for. Contact a health care provider if you: Think you are having a reaction to a medicine you are taking. Have headaches that keep coming back (recurring). Feel dizzy. Have swelling in your ankles. Have trouble with your vision. Get help right away if you: Develop a severe headache or confusion. Have unusual weakness or numbness. Feel faint. Have severe pain in your chest or abdomen. Vomit repeatedly. Have trouble breathing. These symptoms may be an emergency. Get help right away. Call 911. Do not wait to see if the symptoms will go away. Do not drive yourself to the hospital. Summary Hypertension is when the force of blood pumping through your arteries is too strong. If this condition is not controlled, it may put you at risk for serious complications. Your personal target blood pressure may vary depending on your medical conditions, your age, and other factors. For most people, a normal blood pressure is less than 120/80. Hypertension is treated with lifestyle changes, medicines, or a combination of both. Lifestyle changes include losing weight, eating a healthy,   low-sodium diet, exercising more, and limiting alcohol. This information is not intended to replace advice given to you by your health care provider. Make sure you discuss any questions you have with your health care provider. Document Revised: 08/05/2021 Document Reviewed: 08/05/2021 Elsevier Patient Education  [place].   04/07/2022 Office Visit Neurology George Hugh, Claudie Revering, [address], [place] [address]   [phone] (Work)   [phone] (Fax)

## 2022-03-19 NOTE — Progress Notes (Signed)
I,Tianna Badgett,acting as a Neurosurgeon for [employer], FNP.,have documented all relevant documentation on the behalf of Arnette Felts, FNP,as directed by  Arnette Felts, FNP while in the presence of Arnette Felts, FNP.  This visit occurred during the SARS-CoV-2 public health emergency.  Safety protocols were in place, including screening questions prior to the visit, additional usage of staff PPE, and extensive cleaning of exam room while observing appropriate contact time as indicated for disinfecting solutions.  Subjective:     Patient ID: Connor Andrews , male    DOB: 21-Jan-1971 , 51 y.o.   MRN: [id number]   Chief Complaint  Patient presents with   Hypertension    HPI  Patient presents today for a bp check. He is still concerned about the numbness and tingling in his hands. He has an appt with behavioral health   BP Readings from Last 3 Encounters: 03/19/22 : 138/70 11/19/21 : (!) 144/96 10/15/21 : 136/88    Hypertension This is a chronic problem. The current episode started more than 1 month ago. The problem has been gradually improving since onset. The problem is controlled. There are no associated agents to hypertension. Past treatments include calcium channel blockers. There are no compliance problems.  There is no history of chronic renal disease.     Past Medical History:  Diagnosis Date   Arthritis    right hip   Hypertension    Low back pain    right side     Family History  Problem Relation Age of Onset   Hypertension Mother    Diabetes Mother    Hypertension Father      Current Outpatient Medications:    amLODipine (NORVASC) 10 MG tablet, Take 1 tablet (10 mg total) by mouth daily., Disp: 90 tablet, Rfl: 1   celecoxib (CELEBREX) 50 MG capsule, Take 1 capsule (50 mg total) by mouth 2 (two) times daily., Disp: 60 capsule, Rfl: 1   cyanocobalamin 1000 MCG tablet, Take 1 tablet (1,000 mcg total) by mouth daily., Disp: 30 tablet, Rfl: 1   losartan (COZAAR) 25 MG  tablet, Take 1 tablet (25 mg total) by mouth daily., Disp: 30 tablet, Rfl: 1   pregabalin (LYRICA) 75 MG capsule, Take 1 capsule (75 mg total) by mouth 2 (two) times daily., Disp: 60 capsule, Rfl: 5   thiamine 100 MG tablet, Take 1 tablet (100 mg total) by mouth daily., Disp: 30 tablet, Rfl: 1   traMADol (ULTRAM) 50 MG tablet, Take 1 tablet (50 mg total) by mouth every 6 (six) hours as needed for moderate pain., Disp: 30 tablet, Rfl: 0   Vitamin D, Ergocalciferol, (DRISDOL) 1.25 MG (50000 UNIT) CAPS capsule, Take 1 capsule (50,000 Units total) by mouth 2 (two) times a week., Disp: 24 capsule, Rfl: 1   OLANZapine (ZYPREXA) 2.5 MG tablet, Take 1 tablet (2.5 mg total) by mouth at bedtime., Disp: 30 tablet, Rfl: 0   sertraline (ZOLOFT) 50 MG tablet, Take 1 tablet (50 mg total) by mouth daily., Disp: 30 tablet, Rfl: 0   No Known Allergies   Review of Systems  Constitutional: Negative.   Respiratory: Negative.    Cardiovascular: Negative.   Gastrointestinal: Negative.   Neurological: Negative.      Today's Vitals   03/19/22 1517  BP: 138/70  Pulse: 78  Temp: 98.5 F (36.9 C)  TempSrc: Oral  Weight: 130 lb (59 kg)  Height: 5\' 9"  (1.753 m)   Body mass index is 19.2 kg/m.  Wt Readings from Last  3 Encounters:  03/19/22 130 lb (59 kg)  11/19/21 132 lb 12.8 oz (60.2 kg)  10/15/21 134 lb 3.2 oz (60.9 kg)    Objective:  Physical Exam Vitals reviewed.  Constitutional:      General: He is not in acute distress.    Appearance: Normal appearance.  Cardiovascular:     Rate and Rhythm: Normal rate and regular rhythm.     Pulses: Normal pulses.     Heart sounds: Normal heart sounds. No murmur heard. Pulmonary:     Effort: Pulmonary effort is normal. No respiratory distress.     Breath sounds: Normal breath sounds. No wheezing.  Musculoskeletal:     Comments: Continues to have limitations with his hands  Neurological:     Mental Status: He is alert and oriented to person, place, and  time.     Cranial Nerves: No cranial nerve deficit.     Motor: No weakness.  Psychiatric:        Mood and Affect: Mood normal.        Behavior: Behavior normal.        Thought Content: Thought content normal.        Judgment: Judgment normal.         Assessment And Plan:     1. Essential hypertension Comments: Blood pressure is fairly controlled. Continue current medications.  - AMB Referral to [facility]  2. Numbness and tingling in right hand Comments: I am referring to [facility] to see if can help with filing for Medicaid/disability - AMB Referral to [facility]  3. Other depression Comments: Continue follow up with Psychiatry/counselor - AMB Referral to [facility]   Noticed right before leaving in the note from behavioral health they were requesting labs and an EKG however he does not have any insurance. I am trying to get him set up with our social worker to help with resources.  Given the phone number for the neuroimmunologist at [facility] he has an appt on 04/07/2022  Patient was given opportunity to ask questions. Patient verbalized understanding of the plan and was able to repeat key elements of the plan. All questions were answered to their satisfaction.  Arnette Felts, FNP   I, Arnette Felts, FNP, have reviewed all documentation for this visit. The documentation on 03/19/22 for the exam, diagnosis, procedures, and orders are all accurate and complete.   IF YOU HAVE BEEN REFERRED TO A SPECIALIST, IT MAY TAKE 1-2 WEEKS TO SCHEDULE/PROCESS THE REFERRAL. IF YOU HAVE NOT HEARD FROM US/SPECIALIST IN TWO WEEKS, PLEASE GIVE [place] A CALL AT [phone].   THE PATIENT IS ENCOURAGED TO PRACTICE SOCIAL DISTANCING DUE TO THE COVID-19 PANDEMIC.

## 2022-03-19 NOTE — Progress Notes (Signed)
BH MD/PA/NP OP Progress Note  03/19/2022 5:15 PM Connor Andrews  MRN:  [id number]  Chief Complaint:  Chief Complaint  Patient presents with   Medication Management   HPI:  Connor Andrews is a 51 yr old male who presents to establish care and for medication management.  Psychiatric diagnosis of MDD, Severe w/out Psychosis and GAD.  He reports that he has not had improvement in his symptoms since starting the Zoloft and Zyprexa.  He reports continuing to have issues scheduling appointments with neurology and reports ongoing frustration with having to wait several months to see a neurologist and told he needs to see a different neurologist and it will again take several months for this appointment.  He reports not being able to work and struggles with finances because of this continues to weigh heavily on him.  He reports difficulty seeing his children and grandchildren because of his physical and financial issues.  He also reports issues with having to stay with his parents who are in their 66s because he is unable to afford anything else.  He reports that he has had some SI at times because he feels so overwhelmed but states he is always able to push those thoughts away.  Discussed with him what to do in the event of a future crisis.  Discussed that he can return to [facility], go to the [facility], go to the nearest ED, or call 911 or [phone].   He reported understanding and that he would reach out if he starts feeling overwhelmed.  Discussed that we would increase his Zoloft.  Discussed the need for some lab work because he is on Zyprexa and he reports he will go to his PCP's to have this done.  Discussed that he will have close follow-up for 3 weeks to return to the office for further medication adjustments.  He was agreeable to this and had no other concerns at present.  He reports his sleep continues to be poor.  He reports his appetite continues to be poor.  He reports no SI, HI, or AVH.  He reports no side  effects from his medications.   Visit Diagnosis:    ICD-10-CM   1. Severe episode of recurrent major depressive disorder, without psychotic features (HCC)  F33.2 sertraline (ZOLOFT) 50 MG tablet    OLANZapine (ZYPREXA) 2.5 MG tablet    2. GAD (generalized anxiety disorder)  F41.1 sertraline (ZOLOFT) 50 MG tablet      Past Psychiatric History: No previous diagnosis's, medications, or hospitalizations  Past Medical History:  Past Medical History:  Diagnosis Date   Arthritis    right hip   Hypertension    Low back pain    right side   No past surgical history on file.  Family Psychiatric History: No known Diagnosis', Substance Abuse, or Suicides.  Family History:  Family History  Problem Relation Age of Onset   Hypertension Mother    Diabetes Mother    Hypertension Father     Social History:  Social History   Socioeconomic History   Marital status: Married    Spouse name: Duwayne Heck   Number of children: 2   Years of education: Not on file   Highest education level: GED or equivalent  Occupational History    Comment: 07/22/20 out of work d/t symptoms  Tobacco Use   Smoking status: Every Day    Packs/day: 0.25    Types: Cigarettes   Smokeless tobacco: Never   Tobacco comments:  07/22/20 1 pk a week     07/07/2021: Patient has been cutting down     10/15/2021 - unable to hold the cigarette so he is cutting back  Vaping Use   Vaping Use: Never used  Substance and Sexual Activity   Alcohol use: Not Currently    Comment: weekends only   Drug use: No   Sexual activity: Yes    Partners: Female  Other Topics Concern   Not on file  Social History Narrative   Lives with wife   Right handed    Lives in a one story home    Social Determinants of Health   Financial Resource Strain: High Risk (01/06/2022)   Overall Financial Resource Strain (CARDIA)    Difficulty of Paying Living Expenses: Very hard  Food Insecurity: Food Insecurity Present (01/06/2022)   Hunger  Vital Sign    Worried About Running Out of Food in the Last Year: Often true    Ran Out of Food in the Last Year: Often true  Transportation Needs: Unmet Transportation Needs (01/06/2022)   PRAPARE - Administrator, Civil Service (Medical): Yes    Lack of Transportation (Non-Medical): Yes  Physical Activity: Inactive (01/06/2022)   Exercise Vital Sign    Days of Exercise per Week: 0 days    Minutes of Exercise per Session: 0 min  Stress: Stress Concern Present (01/06/2022)   [employer] of Occupational Health - Occupational Stress Questionnaire    Feeling of Stress : Very much  Social Connections: Socially Isolated (01/06/2022)   Social Connection and Isolation Panel [NHANES]    Frequency of Communication with Friends and Family: Once a week    Frequency of Social Gatherings with Friends and Family: Never    Attends Religious Services: Never    Database administrator or Organizations: No    Attends Engineer, structural: Never    Marital Status: Separated    Allergies: No Known Allergies  Metabolic Disorder Labs: Lab Results  Component Value Date   HGBA1C 4.6 (L) 05/31/2019   No results found for: "PROLACTIN" No results found for: "CHOL", "TRIG", "HDL", "CHOLHDL", "VLDL", "LDLCALC" Lab Results  Component Value Date   TSH 1.071 04/06/2021   TSH 0.538 05/31/2019    Therapeutic Level Labs: No results found for: "LITHIUM" No results found for: "VALPROATE" No results found for: "CBMZ"  Current Medications: Current Outpatient Medications  Medication Sig Dispense Refill   amLODipine (NORVASC) 10 MG tablet Take 1 tablet (10 mg total) by mouth daily. 90 tablet 1   celecoxib (CELEBREX) 50 MG capsule Take 1 capsule (50 mg total) by mouth 2 (two) times daily. 60 capsule 1   cyanocobalamin 1000 MCG tablet Take 1 tablet (1,000 mcg total) by mouth daily. 30 tablet 1   losartan (COZAAR) 25 MG tablet Take 1 tablet (25 mg total) by mouth daily. 30 tablet 1    pregabalin (LYRICA) 75 MG capsule Take 1 capsule (75 mg total) by mouth 2 (two) times daily. 60 capsule 5   thiamine 100 MG tablet Take 1 tablet (100 mg total) by mouth daily. 30 tablet 1   traMADol (ULTRAM) 50 MG tablet Take 1 tablet (50 mg total) by mouth every 6 (six) hours as needed for moderate pain. 30 tablet 0   Vitamin D, Ergocalciferol, (DRISDOL) 1.25 MG (50000 UNIT) CAPS capsule Take 1 capsule (50,000 Units total) by mouth 2 (two) times a week. 24 capsule 1   OLANZapine (ZYPREXA) 2.5 MG tablet  Take 1 tablet (2.5 mg total) by mouth at bedtime. 30 tablet 0   sertraline (ZOLOFT) 50 MG tablet Take 1 tablet (50 mg total) by mouth daily. 30 tablet 0   No current facility-administered medications for this visit.     Musculoskeletal: Strength & Muscle Tone: decreased Gait & Station: unsteady Patient leans: N/A  Psychiatric Specialty Exam: Review of Systems  Respiratory:  Negative for shortness of breath.   Cardiovascular:  Negative for chest pain.  Gastrointestinal:  Negative for abdominal pain, constipation, diarrhea, nausea and vomiting.  Neurological:  Negative for dizziness, weakness (than baseline) and headaches.  Psychiatric/Behavioral:  Positive for dysphoric mood and sleep disturbance. Negative for self-injury and suicidal ideas. The patient is nervous/anxious.     Blood pressure (!) 144/98, pulse (!) 104, height 5\' 9"  (1.753 m), weight 130 lb (59 kg).Body mass index is 19.2 kg/m.  General Appearance: Casual  Eye Contact:  Fair  Speech:  Clear and Coherent and Normal Rate  Volume:  Normal  Mood:  Depressed  Affect:  Congruent, Depressed, and Tearful  Thought Process:  Coherent and Goal Directed  Orientation:  Full (Time, Place, and Person)  Thought Content: Logical   Suicidal Thoughts:  No  Homicidal Thoughts:  No  Memory:  Immediate;   Good Recent;   Good  Judgement:  Fair  Insight:  Fair  Psychomotor Activity:  Normal  Concentration:  Concentration: Good and  Attention Span: Good  Recall:  Good  Fund of Knowledge: Good  Language: Good  Akathisia:  Negative  Handed:  Right  AIMS (if indicated): done AIMS = 0  Assets:  Communication Skills Desire for Improvement Housing Resilience Social Support  ADL's:  Impaired due to physical impairment  Cognition: WNL  Sleep:  Poor   Screenings: GAD-7    Advertising copywriterlowsheet Row Counselor [facility]  Total GAD-7 Score 21      PHQ2-9    Flowsheet Row Counsel[facility] Visit from 10/15/2021 in [facility] Office Visit from 04/17/2021 in [facility] Office Visit from 07/24/2020 in [facility] Office Visit from 05/31/2019 in [facility]  PHQ-2 Total Score 6 6 6  0 0  PHQ-9 Total Score 27 25 15  -- 15      Flowsheet Row Counselor [facility] (Discharged) from [facility] ED [facility]   C-SSRS RISK CATEGORY Moderate Risk No Risk No Risk        Assessment and Plan:  Dennard Nipugene is still having significant depression and anxiety due to his medical issues and the problems scheduling visits with neurology.  Due to this we will increase his Zoloft.  He plans to go to his PCP to get lab work that is needed for monitoring while on antipsychotic-CMP, CBC, A1c, lipid panel, and EKG.  We will also get a TSH.  He will return to the office in 3 weeks.   MDD, Recurrent, Severe   GAD  PTSD: -Increase Zoloft to 50 mg daily for depression and anxiety -Continue Zyprexa 2.5 mg QHS for augmentation, sleep, and increase appetite   Collaboration of Care: Case Discussed with Supervising Physician Dr. Lucianne MussKumar  Patient/Guardian was advised Release of Information must be obtained prior to  any record release in order to collaborate their care with  an outside provider. Patient/Guardian was advised if they have not already done so to contact the registration department to sign all necessary forms in order for [place] to release information regarding their care.   Consent: Patient/Guardian gives verbal consent for treatment and assignment of benefits for services provided during this visit. Patient/Guardian expressed understanding and agreed to proceed.    Lauro Franklin, MD 03/19/2022, 5:15 PM

## 2022-03-23 NOTE — Chronic Care Management (AMB) (Signed)
  Care Management   Note  03/23/2022 Name: Connor Andrews MRN: [id number] DOB: 09/07/1971  Connor Andrews is a 51 y.o. year old male who is a primary care patient of Arnette Felts, FNP. I reached out to Hal Neer by phone today offer care coordination services.   [name] was given information about care management services today including:  Care management services include personalized support from designated clinical staff supervised by his physician, including individualized plan of care and coordination with other care providers 24/7 contact phone numbers for assistance for urgent and routine care needs. The patient may stop care management services at any time by phone call to the office staff.  Patient agreed to services and verbal consent obtained.   Follow up plan: Telephone appointment with care management team member scheduled for:03/25/22  [facility] Guide, Embedded Care Coordination [facility]  Care Management  Direct Dial: [phone]

## 2022-03-25 ENCOUNTER — Ambulatory Visit: Payer: Self-pay

## 2022-03-25 ENCOUNTER — Telehealth: Payer: Self-pay

## 2022-03-25 DIAGNOSIS — I10 Essential (primary) hypertension: Secondary | ICD-10-CM

## 2022-03-25 DIAGNOSIS — F3289 Other specified depressive episodes: Secondary | ICD-10-CM

## 2022-03-25 NOTE — Telephone Encounter (Signed)
  Care Management   Follow Up Note   03/25/2022 Name: Connor Andrews MRN: [id number] DOB: [birth year]   Referred by: Arnette Felts, FNP Reason for referral : Care Coordination (Unsuccessful call)   An unsuccessful telephone outreach was attempted today. The patient was referred to the case management team for assistance with care management and care coordination.   Follow Up Plan: The care management team will reach out to the patient again over the next 30 days.   Bevelyn Ngo, BSW, CDP Social Worker, Certified Dementia Practitioner [facility] Care Management [phone]

## 2022-03-25 NOTE — Chronic Care Management (AMB) (Signed)
Social Work Note  03/25/2022 Name: Connor Andrews MRN: [id number] DOB: 1971/03/04  Connor Andrews is a 51 y.o. year old male who is a primary care patient of Arnette Felts, FNP.  The Care Management team was consulted for assistance with chronic disease management and care coordination needs.  Mr. Mahaffy was given information about Care Management services today including:  Care Management services include personalized support from designated clinical staff supervised by his physician, including individualized plan of care and coordination with other care providers 24/7 contact phone numbers for assistance for urgent and routine care needs. The patient may stop care management services at any time (effective at the end of the month) by phone call to the office staff.  Patient agreed to services and consent obtained.   Engaged with patient by telephone for initial visit in response to provider referral for social work chronic care management and care coordination services.  Assessment: Review of patient past medical history, allergies, medications, and health status, including review of pertinent consultant reports was performed as part of comprehensive evaluation and provision of care management/care coordination services.   SDOH (Social Determinants of Health) assessments and interventions performed:  Yes SDOH Interventions    Flowsheet Row Most Recent Value  SDOH Interventions   Housing Interventions Intervention Not Indicated  Transportation Interventions Patient Refused  [Patient utilizes Family]        Advanced Directives Status: Not addressed in this encounter.  Care Plan  No Known Allergies  Outpatient Encounter Medications as of 03/25/2022  Medication Sig   amLODipine (NORVASC) 10 MG tablet Take 1 tablet (10 mg total) by mouth daily.   celecoxib (CELEBREX) 50 MG capsule Take 1 capsule (50 mg total) by mouth 2 (two) times daily.   cyanocobalamin 1000 MCG tablet Take 1  tablet (1,000 mcg total) by mouth daily.   losartan (COZAAR) 25 MG tablet Take 1 tablet (25 mg total) by mouth daily.   OLANZapine (ZYPREXA) 2.5 MG tablet Take 1 tablet (2.5 mg total) by mouth at bedtime.   pregabalin (LYRICA) 75 MG capsule Take 1 capsule (75 mg total) by mouth 2 (two) times daily.   sertraline (ZOLOFT) 50 MG tablet Take 1 tablet (50 mg total) by mouth daily.   thiamine 100 MG tablet Take 1 tablet (100 mg total) by mouth daily.   traMADol (ULTRAM) 50 MG tablet Take 1 tablet (50 mg total) by mouth every 6 (six) hours as needed for moderate pain.   Vitamin D, Ergocalciferol, (DRISDOL) 1.25 MG (50000 UNIT) CAPS capsule Take 1 capsule (50,000 Units total) by mouth 2 (two) times a week.   No facility-administered encounter medications on file as of 03/25/2022.    Patient Active Problem List   Diagnosis Date Noted   GAD (generalized anxiety disorder) 01/06/2022   Severe episode of recurrent major depressive disorder, without psychotic features (HCC) 01/06/2022   CSF abnormal    Weight loss    HTN (hypertension) 04/06/2021   Tobacco abuse 04/06/2021   Right sided weakness 04/06/2021    Conditions to be addressed/monitored: HTN and Depression; Financial constraints related to lack of income and health coverage  Care Plan : Social Work Plan of Care  Updates made by Bevelyn Ngo since 03/25/2022 12:00 AM     Problem: Quality of Life (General Plan of Care)      Goal: Quality of Life Maintained   Start Date: 03/25/2022  Priority: High  Note:   Current Barriers:  Chronic disease management support and education needs  related to HTN and Depression  Financial constraints related to lack of income Lacks knowledge of how to apply for Medicaid  Social Worker Clinical Goal(s):  patient will work with SW to identify and address any acute and/or chronic care coordination needs related to the self health management of HTN and Depression  patient will work with SW to address  concerns related to lack of health care and income SW Interventions:  Inter-disciplinary care team collaboration (see longitudinal plan of care) Collaboration with Arnette Felts, FNP regarding development and update of comprehensive plan of care as evidenced by provider attestation and co-signature Telephonic visit completed with the patient to assist with care coordination needs Discussed the patient has been out of work for over two years and has no source of income or health coverage Patient reports he currently lives with his parents and has difficulty performing all ADL's; his parents will assist as they are able to Determined the patient did apply for disability over two years ago and has yet to receive a determination Discussed plan for SW to refer the patient to [facility] in an attempt to help the process of applying for disability Educated the patient on the process to apply for Medicaid, patient would like a paper application to apply Provided a paper application for the patient to pick up from his primary care providers office Voice message left for Disability Manager at the [facility] requesting a return call to place patient referral Collaboration with RN Care Manager Lawanna Kobus Little regarding interventions and plan Scheduled follow up call over the next month  Patient Goals/Self-Care Activities patient will:   -  Obtain Medicaid application from primary care office -Complete and submit Medicaid application to DSS -Engage with [facility] regarding Disability application process  Follow Up Plan: The care management team will reach out to the patient again over the next 30 days.        Follow Up Plan: SW will follow up with patient by phone over the next month      Bevelyn Ngo, BSW, CDP Social Worker, Certified Dementia Practitioner [facility] Care Management [phone]

## 2022-03-25 NOTE — Chronic Care Management (AMB) (Signed)
Care Management    RN Visit Note  03/25/2022 Name: Connor Andrews MRN: [id number] DOB: 1971/07/13  Subjective: Connor Andrews is a 51 y.o. year old male who is a primary care patient of Connor Felts, FNP. The care management team was consulted for assistance with disease management and care coordination needs.    Collaboration with Connor Andrews BSW  for  initiation of plan of care  in response to provider referral for case management and/or care coordination services.   Consent to Services:   Connor Andrews was given information about Care Management services today including:  Care Management services includes personalized support from designated clinical staff supervised by his physician, including individualized plan of care and coordination with other care providers 24/7 contact phone numbers for assistance for urgent and routine care needs. The patient may stop case management services at any time by phone call to the office staff.  Patient agreed to services and consent obtained.   Assessment: Review of patient past medical history, allergies, medications, health status, including review of consultants reports, laboratory and other test data, was performed as part of comprehensive evaluation and provision of chronic care management services.   SDOH (Social Determinants of Health) assessments and interventions performed:    Care Plan  No Known Allergies  Outpatient Encounter Medications as of 03/25/2022  Medication Sig   amLODipine (NORVASC) 10 MG tablet Take 1 tablet (10 mg total) by mouth daily.   celecoxib (CELEBREX) 50 MG capsule Take 1 capsule (50 mg total) by mouth 2 (two) times daily.   cyanocobalamin 1000 MCG tablet Take 1 tablet (1,000 mcg total) by mouth daily.   losartan (COZAAR) 25 MG tablet Take 1 tablet (25 mg total) by mouth daily.   OLANZapine (ZYPREXA) 2.5 MG tablet Take 1 tablet (2.5 mg total) by mouth at bedtime.   pregabalin (LYRICA) 75 MG capsule Take 1 capsule  (75 mg total) by mouth 2 (two) times daily.   sertraline (ZOLOFT) 50 MG tablet Take 1 tablet (50 mg total) by mouth daily.   thiamine 100 MG tablet Take 1 tablet (100 mg total) by mouth daily.   traMADol (ULTRAM) 50 MG tablet Take 1 tablet (50 mg total) by mouth every 6 (six) hours as needed for moderate pain.   Vitamin D, Ergocalciferol, (DRISDOL) 1.25 MG (50000 UNIT) CAPS capsule Take 1 capsule (50,000 Units total) by mouth 2 (two) times a week.   No facility-administered encounter medications on file as of 03/25/2022.    Patient Active Problem List   Diagnosis Date Noted   GAD (generalized anxiety disorder) 01/06/2022   Severe episode of recurrent major depressive disorder, without psychotic features (HCC) 01/06/2022   CSF abnormal    Weight loss    HTN (hypertension) 04/06/2021   Tobacco abuse 04/06/2021   Right sided weakness 04/06/2021    Conditions to be addressed/monitored: HTN and Depression  Care Plan : RN Care Manager Plan of Care  Updates made by Connor Churches, RN since 03/25/2022 12:00 AM     Problem: No plan of care established for managment of chronic disease states (Essential Hypertension, Depression)   Priority: High     Long-Range Goal: Establishment of plan of care for management of chronic disease states (Essential Hypertension, Depression)   Start Date: 03/25/2022  Expected End Date: 03/26/2023  This Visit's Progress: On track  Priority: High  Note:   Current Barriers:  Knowledge Deficits related to plan of care for management of HTN and Depression  Chronic  Disease Management support and education needs related to HTN and Depression  Financial Constraints   RNCM Clinical Goal(s):  Patient will continue to work with RN Care Manager to address care management and care coordination needs related to  HTN and Depression as evidenced by adherence to CM Team Scheduled appointments work with Child psychotherapist to address  related to the management of Financial  constraints related to no income or health insurance related to the management of HTN and Depression as evidenced by review of EMR and patient or Child psychotherapist report through collaboration with Medical illustrator, provider, and care team.   Interventions: 1:1 collaboration with primary care provider regarding development and update of comprehensive plan of care as evidenced by provider attestation and co-signature Inter-disciplinary care team collaboration (see longitudinal plan of care) Evaluation of current treatment plan related to  self management and patient's adherence to plan as established by provider   Interdisciplinary Collaboration Interventions:  (Status: New goal.) Long Term Goal   Collaborated with BSW to initiate plan of care to address needs related to Financial constraints related to no income or health insurance  in patient with HTN and Depression Collaboration with Connor Felts, FNP regarding development and update of comprehensive plan of care as evidenced by provider attestation and co-signature Inter-disciplinary care team collaboration   Patient Goals/Self-Care Activities: Take all medications as prescribed Attend all scheduled provider appointments Perform all self care activities independently  Call provider office for new concerns or questions  Work with the social worker to address care coordination needs and will continue to work with the clinical team to address health care and disease management related needs  Follow Up Plan:  Initial RN Care Manager telephonic outreach is scheduled for:  04/16/22     Delsa Sale, RN, BSN, CCM Care Management Coordinator [facility] Care Management/[facility]  Direct Phone: [phone]

## 2022-03-25 NOTE — Patient Instructions (Signed)
Visit Information  Thank you for taking time to visit with me today. Please don't hesitate to contact me if I can be of assistance to you before our next scheduled telephone appointment.  Following are the goals we discussed today:  Patient Goals/Self-Care Activities patient will:   - Obtain Medicaid application from primary care office -Complete and submit Medicaid application to [place] with [facility] regarding Disability application process  Our next appointment is by telephone on 04/22/22  Please call the care guide team at [phone] if you need to cancel or reschedule your appointment.   If you are experiencing a Mental Health or [place] or need someone to talk to, please go to [facility] [address] [place] [phone])   Following is a copy of your full plan of care:  Care Plan : Social Work Plan of Care  Updates made by Daneen Schick since 03/25/2022 12:00 AM     Problem: Quality of Life (General Plan of Care)      Goal: Quality of Life Maintained   Start Date: 03/25/2022  Priority: High  Note:   Current Barriers:  Chronic disease management support and education needs related to HTN and Depression  Financial constraints related to lack of income Lacks knowledge of how to apply for Medicaid  Social Worker Clinical Goal(s):  patient will work with SW to identify and address any acute and/or chronic care coordination needs related to the self health management of HTN and Depression  patient will work with SW to address concerns related to lack of health care and income SW Interventions:  Inter-disciplinary care team collaboration (see longitudinal plan of care) Collaboration with [name], [place] regarding development and update of comprehensive plan of care as evidenced by provider attestation and co-signature Telephonic visit completed with the patient to assist with care coordination  needs Discussed the patient has been out of work for over two years and has no source of income or health coverage Patient reports he currently lives with his parents and has difficulty performing all ADL's; his parents will assist as they are able to Determined the patient did apply for disability over two years ago and has yet to receive a determination Discussed plan for SW to refer the patient to [facility] in an attempt to help the process of applying for disability Educated the patient on the process to apply for Medicaid, patient would like a paper application to apply Provided a paper application for the patient to pick up from his primary care providers office Voice message left for Disability Manager at the [facility] requesting a return call to place patient referral Collaboration with [place] regarding interventions and plan Scheduled follow up call over the next month  Patient Goals/Self-Care Activities patient will:   -  Obtain Medicaid application from primary care office -Complete and submit Medicaid application to [place] with [facility] regarding Disability application process  Follow Up Plan: The care management team will reach out to the patient again over the next 30 days.       Connor Andrews was given information about Care Management services by the embedded care coordination team including:  Care Management services include personalized support from designated clinical staff supervised by his physician, including individualized plan of care and coordination with other care providers 24/7 contact phone numbers for assistance for urgent and routine care needs. The patient may stop CCM services at  any time (effective at the end of the month) by phone call to the office staff.  Patient agreed to services and verbal consent obtained.   Patient verbalizes understanding of instructions and care plan provided today and agrees to  view in [place]. Active MyChart status and patient understanding of how to access instructions and care plan via MyChart confirmed with patient.     The care management team will reach out to the patient again over the next 30 days.   Daneen Schick, BSW, CDP Social Worker, Certified Dementia Practitioner [place] Management [phone]

## 2022-03-30 ENCOUNTER — Encounter: Payer: Self-pay | Admitting: Nurse Practitioner

## 2022-04-09 ENCOUNTER — Encounter (HOSPITAL_COMMUNITY): Payer: No Payment, Other | Admitting: Student in an Organized Health Care Education/Training Program

## 2022-04-15 ENCOUNTER — Other Ambulatory Visit: Payer: Self-pay | Admitting: Gastroenterology

## 2022-04-15 DIAGNOSIS — R634 Abnormal weight loss: Secondary | ICD-10-CM

## 2022-04-16 ENCOUNTER — Telehealth: Payer: Self-pay

## 2022-04-16 NOTE — Telephone Encounter (Signed)
  Care Management   Follow Up Note   04/16/2022 Name: Connor Andrews MRN: [id number] DOB: [birth year]   Referred by: Arnette Felts, FNP Reason for referral : Care Coordination (RN CM initial outreach )   An unsuccessful telephone outreach was attempted today. The patient was referred to the case management team for assistance with care management and care coordination.   Follow Up Plan: Telephone follow up appointment with care management team member scheduled for: 05/04/22  Delsa Sale, RN, BSN, CCM Care Management Coordinator [facility] Care Management/[facility]  Direct Phone: [phone]

## 2022-04-22 ENCOUNTER — Telehealth: Payer: Self-pay

## 2022-04-22 NOTE — Telephone Encounter (Signed)
  Care Management   Follow Up Note   04/22/2022 Name: Connor Andrews MRN: [id number] DOB: 1971/03/09   Referred by: Arnette Felts, FNP Reason for referral : Care Coordination (Unsuccessful call)   An unsuccessful telephone outreach was attempted today. The patient was referred to the case management team for assistance with care management and care coordination.   Follow Up Plan: A HIPPA compliant phone message was left for the patient providing contact information and requesting a return call.   Bevelyn Ngo, BSW, CDP Social Worker, Certified Dementia Practitioner [facility] Care Management [phone]

## 2022-05-04 ENCOUNTER — Ambulatory Visit: Payer: Self-pay

## 2022-05-04 DIAGNOSIS — F3289 Other specified depressive episodes: Secondary | ICD-10-CM

## 2022-05-04 DIAGNOSIS — I10 Essential (primary) hypertension: Secondary | ICD-10-CM

## 2022-05-04 DIAGNOSIS — R200 Anesthesia of skin: Secondary | ICD-10-CM

## 2022-05-05 NOTE — Chronic Care Management (AMB) (Signed)
Care Management    RN Visit Note  05/04/2022 Name: Connor Andrews MRN: [id number] DOB: 05-Sep-1971  Subjective: Connor Andrews is a 51 y.o. year old male who is a primary care patient of Connor Andrews, [place]. The care management team was consulted for assistance with disease management and care coordination needs.    Engaged with patient by telephone for initial visit in response to provider referral for case management and/or care coordination services.   Consent to Services:   Mr. Connor Andrews was given information about Care Management services today including:  Care Management services includes personalized support from designated clinical staff supervised by his physician, including individualized plan of care and coordination with other care providers 24/7 contact phone numbers for assistance for urgent and routine care needs. The patient may stop case management services at any time by phone call to the office staff.  Patient agreed to services and consent obtained.   Assessment: Review of patient past medical history, allergies, medications, health status, including review of consultants reports, laboratory and other test data, was performed as part of comprehensive evaluation and provision of chronic care management services.   SDOH (Social Determinants of Health) assessments and interventions performed:  Yes, see SW care plan  Care Plan  No Known Allergies  Outpatient Encounter Medications as of 05/04/2022  Medication Sig   amLODipine (NORVASC) 10 MG tablet Take 1 tablet (10 mg total) by mouth daily.   celecoxib (CELEBREX) 50 MG capsule Take 1 capsule (50 mg total) by mouth 2 (two) times daily.   cyanocobalamin 1000 MCG tablet Take 1 tablet (1,000 mcg total) by mouth daily.   losartan (COZAAR) 25 MG tablet Take 1 tablet (25 mg total) by mouth daily.   pregabalin (LYRICA) 75 MG capsule Take 1 capsule (75 mg total) by mouth 2 (two) times daily.   thiamine 100 MG tablet Take 1 tablet  (100 mg total) by mouth daily.   traMADol (ULTRAM) 50 MG tablet Take 1 tablet (50 mg total) by mouth every 6 (six) hours as needed for moderate pain.   Vitamin D, Ergocalciferol, (DRISDOL) 1.25 MG (50000 UNIT) CAPS capsule Take 1 capsule (50,000 Units total) by mouth 2 (two) times a week.   No facility-administered encounter medications on file as of 05/04/2022.    Patient Active Problem List   Diagnosis Date Noted   GAD (generalized anxiety disorder) 01/06/2022   Severe episode of recurrent major depressive disorder, without psychotic features ([place]) 01/06/2022   CSF abnormal    Weight loss    HTN (hypertension) 04/06/2021   Tobacco abuse 04/06/2021   Right sided weakness 04/06/2021    Conditions to be addressed/monitored: HTN, Depression, and numbness and tingling of right hand  Care Plan : [place] of Care  Updates made by Lynne Logan, RN since 05/04/2022 12:00 AM     Problem: No plan of care established for managment of chronic disease states (Essential Hypertension, Depression)   Priority: High     Long-Range Goal: Establishment of plan of care for management of chronic disease states (Essential Hypertension, Depression)   Start Date: 03/25/2022  Expected End Date: 03/26/2023  Recent Progress: On track  Priority: High  Note:   Current Barriers:  Knowledge Deficits related to plan of care for management of HTN and Depression  Chronic Disease Management support and education needs related to HTN and Depression  Financial Constraints   RNCM Clinical Goal(s):  Patient will continue to work with RN Care Manager to  address care management and care coordination needs related to  HTN and Depression as evidenced by adherence to CM Team Scheduled appointments work with Education officer, museum to address  related to the management of Financial constraints related to no income or health insurance related to the management of HTN and Depression as evidenced by review of EMR and  patient or Education officer, museum report through collaboration with Consulting civil engineer, provider, and care team.   Interventions: 1:1 collaboration with primary care provider regarding development and update of comprehensive plan of care as evidenced by provider attestation and co-signature Inter-disciplinary care team collaboration (see longitudinal plan of care) Evaluation of current treatment plan related to  self management and patient's adherence to plan as established by provider  Interdisciplinary Collaboration Interventions:  (Status: New goal.) Long Term Goal   Collaborated with BSW to initiate plan of care to address needs related to Financial constraints related to no income or health insurance  in patient with HTN and Depression Collaboration with Connor Brine, FNP regarding development and update of comprehensive plan of care as evidenced by provider attestation and co-signature Inter-disciplinary care team collaboration   Hypertension Interventions:  (Status:  New goal.) Long Term Goal Last practice recorded BP readings:  BP Readings from Last 3 Encounters:  03/19/22 138/70  11/19/21 (!) 144/96  10/15/21 136/88  Most recent eGFR/CrCl:  Lab Results  Component Value Date   EGFR 106 10/15/2021    No components found for: "CRCL" Evaluation of current treatment plan related to hypertension self management and patient's adherence to plan as established by provider Reviewed medications with patient and discussed importance of compliance Discussed plans with patient for ongoing care management follow up and provided patient with direct contact information for care management team Advised patient, providing education and rationale, to monitor blood pressure daily and record, calling PCP for findings outside established parameters Provided education on prescribed diet low Sodium  Discussed complications of poorly controlled blood pressure such as heart disease, stroke, circulatory complications,  vision complications, kidney impairment, sexual dysfunction   Patient Goals/Self-Care Activities: Take all medications as prescribed Attend all scheduled provider appointments Perform all self care activities independently  Call provider office for new concerns or questions  Work with the social worker to address care coordination needs and will continue to work with the clinical team to address health care and disease management related needs check blood pressure 3 times per week keep a blood pressure log take blood pressure log to all doctor appointments call doctor for signs and symptoms of high blood pressure keep all doctor appointments take medications for blood pressure exactly as prescribed begin an exercise program report new symptoms to your doctor  Follow Up Plan:  Initial RN Care Manager telephonic outreach is scheduled for:  06/01/22     Barb Merino, RN, BSN, CCM Care Management Coordinator [place] [facility]  Direct Phone: [phone]

## 2022-05-05 NOTE — Patient Instructions (Signed)
Visit Information  Thank you for taking time to visit with me today. Please don't hesitate to contact me if I can be of assistance to you before our next scheduled telephone appointment.  Following are the goals we discussed today:  Take all medications as prescribed Attend all scheduled provider appointments Perform all self care activities independently  Call provider office for new concerns or questions  Work with the social worker to address care coordination needs and will continue to work with the clinical team to address health care and disease management related needs check blood pressure 3 times per week keep a blood pressure log take blood pressure log to all doctor appointments call doctor for signs and symptoms of high blood pressure keep all doctor appointments take medications for blood pressure exactly as prescribed begin an exercise program report new symptoms to your doctor  Our next appointment is by telephone on 06/01/22 at 12:30 PM   Please call the care guide team at [phone] if you need to cancel or reschedule your appointment.   If you are experiencing a Mental Health or Behavioral Health Crisis or need someone to talk to, please call 1-800-273-TALK (toll free, 24 hour hotline)   Patient verbalizes understanding of instructions and care plan provided today and agrees to view in MyChart. Active MyChart status and patient understanding of how to access instructions and care plan via MyChart confirmed with patient.     Delsa Sale, RN, BSN, CCM Care Management Coordinator [facility] Management/[facility]  Direct Phone: [phone]

## 2022-05-06 ENCOUNTER — Ambulatory Visit: Payer: Self-pay

## 2022-05-06 DIAGNOSIS — I10 Essential (primary) hypertension: Secondary | ICD-10-CM

## 2022-05-06 DIAGNOSIS — F3289 Other specified depressive episodes: Secondary | ICD-10-CM

## 2022-05-06 NOTE — Progress Notes (Signed)
This encounter was created in error - please disregard.

## 2022-05-06 NOTE — Patient Instructions (Signed)
Visit Information  Thank you for taking time to visit with me today. Please don't hesitate to contact me if I can be of assistance to you.   Following are the goals we discussed today:   Goals Addressed     Patient Stated     "To be able to get back to normal" (pt-stated)        Care Coordination Interventions: Evaluation of current treatment plan related to hypertension self management and patient's adherence to plan as established by provider Reviewed medications with patient and discussed importance of compliance Advised patient, providing education and rationale, to monitor blood pressure daily and record, calling PCP for findings outside established parameters Provided education on prescribed diet low Sodium  Assessed social determinant of health barriers Determined patient is working with Department of Social Services to apply for [employer] and reapply for SS disability, approval is pending  Collaborated with Bevelyn Ngo BSW regarding ongoing assistance for help with obtaining insurance  Assessed for need for neurology referral to further evaluate symptoms of numbness and tingling to right arm/hand     Our next appointment is by telephone on 06/01/22 at 12:30 PM   Please call the care guide team at [phone] if you need to cancel or reschedule your appointment.   If you are experiencing a Mental Health or Behavioral Health Crisis or need someone to talk to, please call 1-800-273-TALK (toll free, 24 hour hotline)  Patient verbalizes understanding of instructions and care plan provided today and agrees to view in MyChart. Active MyChart status and patient understanding of how to access instructions and care plan via MyChart confirmed with patient.     Telephone follow up appointment with care management team member scheduled for: 06/01/22 @12 :30 PM   , RN, BSN, CCM Care Management Coordinator [facility] Care Management  Direct Phone: [phone]

## 2022-05-06 NOTE — Patient Instructions (Signed)
Visit Information  Thank you for taking time to visit with me today. Please don't hesitate to contact me if I can be of assistance to you before our next scheduled telephone appointment.  Following are the goals we discussed today:  Patient Goals/Self-Care Activities patient will:   -  Obtain [employer] application from primary care office -Complete and submit [employer] application   Please call the care guide team at [phone] if you need to schedule an appointment with me.   If you are experiencing a Mental Health or Behavioral Health Crisis or need someone to talk to, please go to [facility] [address] [place] [phone])   Patient verbalizes understanding of instructions and care plan provided today and agrees to view in MyChart. Active MyChart status and patient understanding of how to access instructions and care plan via MyChart confirmed with patient.     No further follow up required: Please contact me as needed.  Bevelyn Ngo, BSW, CDP Social Worker, Certified Dementia Practitioner Care Coordination [phone]

## 2022-05-06 NOTE — Addendum Note (Signed)
Addended by: Riley Churches on: 05/06/2022 09:20 AM   Modules accepted: Orders, Level of Service

## 2022-05-06 NOTE — Addendum Note (Signed)
Addended by: Riley Churches on: 05/06/2022 09:24 AM   Modules accepted: Orders, Level of Service

## 2022-05-06 NOTE — Chronic Care Management (AMB) (Signed)
Social Work Note  05/06/2022 Name: Connor Andrews MRN: [id number] DOB: 02/19/1971  Connor Andrews is a 51 y.o. year old male who is a primary care patient of [name], [place].  The Care Management team was consulted for assistance with chronic disease management and care coordination needs.  Connor Andrews was given information about Care Management services today including:  Care Management services include personalized support from designated clinical staff supervised by his physician, including individualized plan of care and coordination with other care providers 24/7 contact phone numbers for assistance for urgent and routine care needs. The patient may stop care management services at any time (effective at the end of the month) by phone call to the office staff.  Patient agreed to services and consent obtained.   Engaged with patient by telephone for follow up visit in response to provider referral for social work chronic care management and care coordination services.  Assessment: Review of patient past medical history, allergies, medications, and health status, including review of pertinent consultant reports was performed as part of comprehensive evaluation and provision of care management/care coordination services.   SDOH (Social Determinants of Health) assessments and interventions performed:  No    Advanced Directives Status: Not addressed in this encounter.  Care Plan  No Known Allergies  Outpatient Encounter Medications as of 05/06/2022  Medication Sig   amLODipine (NORVASC) 10 MG tablet Take 1 tablet (10 mg total) by mouth daily.   celecoxib (CELEBREX) 50 MG capsule Take 1 capsule (50 mg total) by mouth 2 (two) times daily.   cyanocobalamin 1000 MCG tablet Take 1 tablet (1,000 mcg total) by mouth daily.   losartan (COZAAR) 25 MG tablet Take 1 tablet (25 mg total) by mouth daily.   OLANZapine (ZYPREXA) 2.5 MG tablet Take 1 tablet (2.5 mg total) by mouth at bedtime.    pregabalin (LYRICA) 75 MG capsule Take 1 capsule (75 mg total) by mouth 2 (two) times daily.   sertraline (ZOLOFT) 50 MG tablet Take 1 tablet (50 mg total) by mouth daily.   thiamine 100 MG tablet Take 1 tablet (100 mg total) by mouth daily.   traMADol (ULTRAM) 50 MG tablet Take 1 tablet (50 mg total) by mouth every 6 (six) hours as needed for moderate pain.   Vitamin D, Ergocalciferol, (DRISDOL) 1.25 MG (50000 UNIT) CAPS capsule Take 1 capsule (50,000 Units total) by mouth 2 (two) times a week.   No facility-administered encounter medications on file as of 05/06/2022.    Patient Active Problem List   Diagnosis Date Noted   GAD (generalized anxiety disorder) 01/06/2022   Severe episode of recurrent major depressive disorder, without psychotic features ([place]) 01/06/2022   CSF abnormal    Weight loss    HTN (hypertension) 04/06/2021   Tobacco abuse 04/06/2021   Right sided weakness 04/06/2021    Conditions to be addressed/monitored: HTN and Depression; Financial constraints related to costs of medical care due to lack of insurance  Care Plan : [place] of Care  Updates made by Daneen Schick since 05/06/2022 12:00 AM  Completed 05/06/2022   Problem: No plan of care established for managment of chronic disease states (Essential Hypertension, Depression) Resolved 05/06/2022  Priority: High     Long-Range Goal: Establishment of plan of care for management of chronic disease states (Essential Hypertension, Depression) Completed 05/06/2022  Start Date: 03/25/2022  Expected End Date: 03/26/2023  Recent Progress: On track  Priority: High  Note:   Current Barriers:  Knowledge  Deficits related to plan of care for management of HTN and Depression  Chronic Disease Management support and education needs related to HTN and Depression  Financial Constraints   RNCM Clinical Goal(s):  Patient will continue to work with RN Care Manager to address care management and care coordination needs  related to  HTN and Depression as evidenced by adherence to CM Team Scheduled appointments work with Education officer, museum to address  related to the management of Financial constraints related to no income or health insurance related to the management of HTN and Depression as evidenced by review of EMR and patient or Education officer, museum report through collaboration with Consulting civil engineer, provider, and care team.   Interventions: 1:1 collaboration with primary care provider regarding development and update of comprehensive plan of care as evidenced by provider attestation and co-signature Inter-disciplinary care team collaboration (see longitudinal plan of care) Evaluation of current treatment plan related to  self management and patient's adherence to plan as established by provider  Interdisciplinary Collaboration Interventions:  (Status: New goal.) Long Term Goal   Collaborated with BSW to initiate plan of care to address needs related to Financial constraints related to no income or health insurance  in patient with HTN and Depression Collaboration with [name], FNP regarding development and update of comprehensive plan of care as evidenced by provider attestation and co-signature Inter-disciplinary care team collaboration   Hypertension Interventions:  (Status:  New goal.) Long Term Goal Last practice recorded BP readings:  BP Readings from Last 3 Encounters:  03/19/22 138/70  11/19/21 (!) 144/96  10/15/21 136/88  Most recent eGFR/CrCl:  Lab Results  Component Value Date   EGFR 106 10/15/2021    No components found for: "CRCL" Evaluation of current treatment plan related to hypertension self management and patient's adherence to plan as established by provider Reviewed medications with patient and discussed importance of compliance Discussed plans with patient for ongoing care management follow up and provided patient with direct contact information for care management team Advised patient,  providing education and rationale, to monitor blood pressure daily and record, calling PCP for findings outside established parameters Provided education on prescribed diet low Sodium  Discussed complications of poorly controlled blood pressure such as heart disease, stroke, circulatory complications, vision complications, kidney impairment, sexual dysfunction   Patient Goals/Self-Care Activities: Take all medications as prescribed Attend all scheduled provider appointments Perform all self care activities independently  Call provider office for new concerns or questions  Work with the social worker to address care coordination needs and will continue to work with the clinical team to address health care and disease management related needs check blood pressure 3 times per week keep a blood pressure log take blood pressure log to all doctor appointments call doctor for signs and symptoms of high blood pressure keep all doctor appointments take medications for blood pressure exactly as prescribed begin an exercise program report new symptoms to your doctor  Follow Up Plan:  Initial RN Care Manager telephonic outreach is scheduled for:  06/01/22     Care Plan : Social Work Plan of Care  Updates made by Daneen Schick since 05/06/2022 12:00 AM  Completed 05/06/2022   Problem: Quality of Life (General Plan of Care) Resolved 05/06/2022     Goal: Quality of Life Maintained Completed 05/06/2022  Start Date: 03/25/2022  Priority: High  Note:   Current Barriers:  Chronic disease management support and education needs related to HTN and Depression  Financial constraints related to  lack of income Lacks knowledge of how to apply for Medicaid  Social Worker Clinical Goal(s):  patient will work with SW to identify and address any acute and/or chronic care coordination needs related to the self health management of HTN and Depression  patient will work with SW to address concerns related to lack  of health care and income SW Interventions:  Inter-disciplinary care team collaboration (see longitudinal plan of care) Collaboration with [name], [place] regarding development and update of comprehensive plan of care as evidenced by provider attestation and co-signature Collaboration with RN Care Manager who indicates patient has submitted Medicaid application and has received a caseworker. RN Care Manager also indicates patient re-applied for disability  Telephonic visit completed with the patient who reports he is interested in obtaining an [employer] Discussed plan for SW to leave [employer] application at the front desk for patient to pick up later today Goal closed  Patient Goals/Self-Care Activities patient will:   -  Production manager from primary care office -Complete and submit [employer] application         Follow Up Plan:  Patient will pick up resources from provider office. No SW follow up planned at this time. The patient is encouraged to contact SW as needed.      Daneen Schick, BSW, CDP Social Worker, Certified Dementia Practitioner Care Coordination [phone]

## 2022-05-06 NOTE — Patient Outreach (Signed)
  Care Coordination   Initial Visit Note   05/06/2022 Name: Koltan Portocarrero MRN: [id number] DOB: 1971-04-04  Leelyn Jasinski is a 51 y.o. year old male who sees Arnette Felts, FNP for primary care. I spoke with  Hal Neer by phone today  What matters to the patients health and wellness today?  Patient would like help with obtaining insurance and having neurological symptoms evaluated and treated.    Goals Addressed     Patient Stated     "To be able to get back to normal" (pt-stated)        Care Coordination Interventions: Evaluation of current treatment plan related to hypertension self management and patient's adherence to plan as established by provider Reviewed medications with patient and discussed importance of compliance Advised patient, providing education and rationale, to monitor blood pressure daily and record, calling PCP for findings outside established parameters Provided education on prescribed diet low Sodium  Assessed social determinant of health barriers Determined patient is working with Department of Social Services to apply for [employer] and reapply for SS disability, approval is pending  Collaborated with Bevelyn Ngo BSW regarding ongoing assistance for help with obtaining insurance  Assessed for need for neurology referral to further evaluate symptoms of numbness and tingling to right arm/hand     SDOH assessments and interventions completed:   Yes  Care Coordination Interventions Activated:  Yes Care Coordination Interventions:  Yes, provided  Follow up plan: Follow up call scheduled for 06/01/22 @12 :30 PM   Encounter Outcome:  Pt. Visit Completed

## 2022-05-08 NOTE — Progress Notes (Signed)
This encounter was created in error - please disregard.

## 2022-06-01 ENCOUNTER — Ambulatory Visit: Payer: Self-pay

## 2022-06-01 NOTE — Patient Outreach (Signed)
  Care Coordination   06/01/2022 Name: Connor Andrews MRN: [id number] DOB: Nov 05, 1970   Care Coordination Outreach Attempts:  An unsuccessful telephone outreach was attempted today to offer the patient information about available care coordination services as a benefit of their health plan.   Follow Up Plan:  Additional outreach attempts will be made to offer the patient care coordination information and services.   Encounter Outcome:  No Answer  Care Coordination Interventions Activated:  No   Care Coordination Interventions:  No, not indicated    Delsa Sale, RN, BSN, CCM Care Management Coordinator [facility] Care Management Direct Phone: [phone]

## 2022-07-21 ENCOUNTER — Encounter: Payer: Self-pay | Admitting: Nurse Practitioner

## 2022-07-21 ENCOUNTER — Ambulatory Visit (INDEPENDENT_AMBULATORY_CARE_PROVIDER_SITE_OTHER): Payer: Self-pay | Admitting: Nurse Practitioner

## 2022-07-21 VITALS — BP 150/90 | HR 100 | Temp 98.1°F | Ht 69.0 in | Wt 124.8 lb

## 2022-07-21 DIAGNOSIS — R202 Paresthesia of skin: Secondary | ICD-10-CM

## 2022-07-21 DIAGNOSIS — F332 Major depressive disorder, recurrent severe without psychotic features: Secondary | ICD-10-CM

## 2022-07-21 DIAGNOSIS — F3289 Other specified depressive episodes: Secondary | ICD-10-CM

## 2022-07-21 DIAGNOSIS — Z23 Encounter for immunization: Secondary | ICD-10-CM

## 2022-07-21 DIAGNOSIS — R200 Anesthesia of skin: Secondary | ICD-10-CM

## 2022-07-21 DIAGNOSIS — E559 Vitamin D deficiency, unspecified: Secondary | ICD-10-CM

## 2022-07-21 DIAGNOSIS — F411 Generalized anxiety disorder: Secondary | ICD-10-CM

## 2022-07-21 DIAGNOSIS — I10 Essential (primary) hypertension: Secondary | ICD-10-CM

## 2022-07-21 DIAGNOSIS — M25541 Pain in joints of right hand: Secondary | ICD-10-CM

## 2022-07-21 DIAGNOSIS — Z79899 Other long term (current) drug therapy: Secondary | ICD-10-CM

## 2022-07-21 MED ORDER — AMLODIPINE BESYLATE 10 MG PO TABS: 10.0000 mg | ORAL_TABLET | Freq: Every day | ORAL | 1 refills | Status: DC

## 2022-07-21 MED ORDER — LOSARTAN POTASSIUM 25 MG PO TABS: 25.0000 mg | ORAL_TABLET | Freq: Every day | ORAL | 1 refills | Status: DC

## 2022-07-21 MED ORDER — THIAMINE HCL 100 MG PO TABS: 100.0000 mg | ORAL_TABLET | Freq: Every day | ORAL | 1 refills | Status: DC

## 2022-07-21 MED ORDER — VITAMIN D (ERGOCALCIFEROL) 1.25 MG (50000 UNIT) PO CAPS: 50000.0000 [IU] | ORAL_CAPSULE | ORAL | 1 refills | Status: DC

## 2022-07-21 MED ORDER — CELECOXIB 50 MG PO CAPS: 50.0000 mg | ORAL_CAPSULE | Freq: Two times a day (BID) | ORAL | 1 refills | Status: DC

## 2022-07-21 NOTE — Patient Instructions (Signed)
Vitamin D Deficiency Vitamin D deficiency is when your body does not have enough vitamin D. Vitamin D is important because: It helps your body use certain minerals. It helps to keep your bones healthy. It lessens irritation and swelling (inflammation). It helps the body's defense system (immune system) work better. Not getting enough vitamin D can make your bones soft. What are the causes? Not eating enough foods that have vitamin D in them. Not getting enough sun. Having diseases that make it hard for your body to take in vitamin D. Having had part of your stomach or part of your small intestine taken out. What increases the risk? Being an older adult. Not spending much time outdoors. Living in a long-term care center. Having dark skin. Taking certain medicines. Being overweight or very overweight (obese). Having long-term (chronic) kidney or liver disease. What are the signs or symptoms? In mild cases, there may be no symptoms. If the condition is very bad, symptoms may include: Bone pain. Muscle pain. Not being able to walk normally. Bones that break easily. Joint pain. How is this treated? Treatment may include taking supplements as told by your doctor. Your doctor will tell you what dose is best for you. This may include taking: Vitamin D. Calcium. Follow these instructions at home: Eating and drinking Eat foods that have vitamin D in them, such as: Dairy products, cereals, or juices that have vitamin D added to them (are fortified). Check the label. Fish, such as salmon or trout. Eggs. The vitamin D is in the yolk. Mushrooms that were treated with UV light. Beef liver. The items listed above may not be a complete list of foods and beverages you can eat and drink. Contact a dietitian for more information. General instructions Take over-the-counter and prescription medicines only as told by your doctor. Take supplements only as told by your doctor. Get sunlight in a  safe way. Do not use a tanning bed. Stay at a healthy weight. Lose weight if you need to. Keep all follow-up visits. How is this prevented? Eating foods that naturally have vitamin D in them. Eating or drinking foods and drinks that have vitamin D added to them, such as cereals, juices, and milk. Taking vitamin D or a multivitamin that has vitamin D in it. Being in the sun. Your body makes vitamin D when your skin gets sunlight. Contact a doctor if: Your symptoms do not go away. You feel like you may vomit (nauseous). You vomit. You poop less often than normal, or you have trouble pooping (constipation). Summary Vitamin D deficiency is when your body does not have enough vitamin D. Vitamin D helps to keep your bones healthy. This condition is often treated by taking a supplement. Your doctor will tell you what dose is best for you. This information is not intended to replace advice given to you by your health care provider. Make sure you discuss any questions you have with your health care provider. Document Revised: 07/04/2021 Document Reviewed: 07/04/2021 Elsevier Patient Education  2023 Elsevier Inc.  

## 2022-07-21 NOTE — Progress Notes (Addendum)
Barnet Glasgow Cupps,acting as a Education administrator for [name], FNP.,have documented all relevant documentation on the behalf of [name], FNP,as directed by  [name], FNP while in the presence of [name], [place].    Subjective:     Patient ID: Connor Andrews , male    DOB: 06-30-1971 , 51 y.o.   MRN: [id number]   Chief Complaint  Patient presents with   Vitamin D    HPI  Patient presents today for a vitamin D check. Patient would like to go back to behavorial health. Patient hasn't had any medications in about a month  because they were lost when he moved. He has completed forms for medicaid but has not heard from them plans to go when he leaves here. He has not heard from disability as well. He also stopped taking his medications for his mood a month ago due to running out. He has not followed back up since earlier this year  BP Readings from Last 3 Encounters: 07/21/22 : (!) 150/90 03/19/22 : 138/70 11/19/21 : Marland Kitchen 144/96       Past Medical History:  Diagnosis Date   Arthritis    right hip   Hypertension    Low back pain    right side     Family History  Problem Relation Age of Onset   Hypertension Mother    Diabetes Mother    Hypertension Father      Current Outpatient Medications:    amLODipine (NORVASC) 10 MG tablet, Take 1 tablet (10 mg total) by mouth daily., Disp: 90 tablet, Rfl: 1   celecoxib (CELEBREX) 50 MG capsule, Take 1 capsule (50 mg total) by mouth 2 (two) times daily., Disp: 60 capsule, Rfl: 1   cyanocobalamin 1000 MCG tablet, Take 1 tablet (1,000 mcg total) by mouth daily. (Patient not taking: Reported on 07/21/2022), Disp: 30 tablet, Rfl: 1   losartan (COZAAR) 25 MG tablet, Take 1 tablet (25 mg total) by mouth daily., Disp: 30 tablet, Rfl: 1   OLANZapine (ZYPREXA) 2.5 MG tablet, Take 1 tablet (2.5 mg total) by mouth at bedtime. (Patient not taking: Reported on 07/21/2022), Disp: 30 tablet, Rfl: 0   pregabalin (LYRICA) 75 MG capsule, Take 1 capsule  (75 mg total) by mouth 2 (two) times daily. (Patient not taking: Reported on 07/21/2022), Disp: 60 capsule, Rfl: 5   sertraline (ZOLOFT) 50 MG tablet, Take 1 tablet (50 mg total) by mouth daily. (Patient not taking: Reported on 07/21/2022), Disp: 30 tablet, Rfl: 0   thiamine (VITAMIN B1) 100 MG tablet, Take 1 tablet (100 mg total) by mouth daily., Disp: 30 tablet, Rfl: 1   Vitamin D, Ergocalciferol, (DRISDOL) 1.25 MG (50000 UNIT) CAPS capsule, Take 1 capsule (50,000 Units total) by mouth 2 (two) times a week., Disp: 24 capsule, Rfl: 1   No Known Allergies   Review of Systems  Constitutional: Negative.   HENT: Negative.    Eyes: Negative.   Respiratory: Negative.    Cardiovascular: Negative.   Gastrointestinal: Negative.   Psychiatric/Behavioral: Negative.       Today's Vitals   07/21/22 1011  BP: (!) 150/90  Pulse: 100  Temp: 98.1 F (36.7 C)  TempSrc: Oral  Weight: 124 lb 12.8 oz (56.6 kg)  Height: _0  (1.753 m)  PainSc: 10-Worst pain ever  PainLoc: Arm   Body mass index is 18.43 kg/m.  Wt Readings from Last 3 Encounters:  07/21/22 124 lb 12.8 oz (56.6 kg)  03/19/22 130 lb (59 kg)  11/19/21 132 lb 12.8 oz (60.2 kg)    Objective:  Physical Exam Vitals reviewed.  Constitutional:      Appearance: Normal appearance.  Cardiovascular:     Rate and Rhythm: Normal rate and regular rhythm.     Pulses: Normal pulses.     Heart sounds: Normal heart sounds. No murmur heard. Pulmonary:     Effort: Pulmonary effort is normal. No respiratory distress.     Breath sounds: Normal breath sounds. No wheezing.  Musculoskeletal:        General: No swelling or tenderness. Normal range of motion.     Comments: Right hand slightly swollen and he guards his hand. Difficult to grab items  Skin:    General: Skin is warm and dry.     Capillary Refill: Capillary refill takes less than 2 seconds.  Neurological:     General: No focal deficit present.     Mental Status: He is alert and  oriented to person, place, and time.     Cranial Nerves: No cranial nerve deficit.         Assessment And Plan:     1. Vitamin D deficiency Will check vitamin D level and supplement as needed.    Also encouraged to spend 15 minutes in the sun daily.  - Vitamin D (25 hydroxy) - Vitamin D, Ergocalciferol, (DRISDOL) 1.25 MG (50000 UNIT) CAPS capsule; Take 1 capsule (50,000 Units total) by mouth 2 (two) times a week.  Dispense: 24 capsule; Refill: 1 - thiamine (VITAMIN B1) 100 MG tablet; Take 1 tablet (100 mg total) by mouth daily.  Dispense: 30 tablet; Refill: 1  2. Essential hypertension Comments: Blood pressure elevated today I will add losartan 25 mg daily.  - amLODipine (NORVASC) 10 MG tablet; Take 1 tablet (10 mg total) by mouth daily.  Dispense: 90 tablet; Refill: 1 - losartan (COZAAR) 25 MG tablet; Take 1 tablet (25 mg total) by mouth daily.  Dispense: 30 tablet; Refill: 1  3. GAD (generalized anxiety disorder) Comments: He has seen psychiatry but does not feel like the medications are effective. Encouraged to f/u with them to inform so changes can be made  4. Severe episode of recurrent major depressive disorder, without psychotic features ([place])  5. Arthralgia of right hand - celecoxib (CELEBREX) 50 MG capsule; Take 1 capsule (50 mg total) by mouth 2 (two) times daily.  Dispense: 60 capsule; Refill: 1  6. Numbness and tingling in both hands Comments: Continues to have symptoms and seen Neurology in [place] but does not feel like improving. Will refer to [facility] for a second opinion.  - Vitamin D, Ergocalciferol, (DRISDOL) 1.25 MG (50000 UNIT) CAPS capsule; Take 1 capsule (50,000 Units total) by mouth 2 (two) times a week.  Dispense: 24 capsule; Refill: 1 - thiamine (VITAMIN B1) 100 MG tablet; Take 1 tablet (100 mg total) by mouth daily.  Dispense: 30 tablet; Refill: 1  7. Other long term (current) drug therapy - TSH - CMP14+EGFR    Patient was given  opportunity to ask questions. Patient verbalized understanding of the plan and was able to repeat key elements of the plan. All questions were answered to their satisfaction.  [name], FNP   I, [name], FNP, have reviewed all documentation for this visit. The documentation on 07/21/22 for the exam, diagnosis, procedures, and orders are all accurate and complete.   IF YOU HAVE BEEN REFERRED TO A SPECIALIST, IT MAY TAKE 1-2 WEEKS TO SCHEDULE/PROCESS THE REFERRAL. IF  YOU HAVE NOT HEARD FROM US/SPECIALIST IN TWO WEEKS, PLEASE GIVE US A CALL AT [phone].   THE PATIENT IS ENCOURAGED TO PRACTICE SOCIAL DISTANCING DUE TO THE COVID-19 PANDEMIC.   

## 2022-07-22 LAB — CMP14+EGFR
ALT: 15 IU/L (ref 0–44)
AST: 20 IU/L (ref 0–40)
Albumin/Globulin Ratio: 1.9 (ref 1.2–2.2)
Albumin: 4.5 g/dL (ref 3.8–4.9)
Alkaline Phosphatase: 80 IU/L (ref 44–121)
BUN/Creatinine Ratio: 12 (ref 9–20)
BUN: 9 mg/dL (ref 6–24)
Bilirubin Total: 0.9 mg/dL (ref 0.0–1.2)
CO2: 22 mmol/L (ref 20–29)
Calcium: 9.5 mg/dL (ref 8.7–10.2)
Chloride: 101 mmol/L (ref 96–106)
Creatinine, Ser: 0.77 mg/dL (ref 0.76–1.27)
Globulin, Total: 2.4 g/dL (ref 1.5–4.5)
Glucose: 89 mg/dL (ref 70–99)
Potassium: 4.5 mmol/L (ref 3.5–5.2)
Sodium: 138 mmol/L (ref 134–144)
Total Protein: 6.9 g/dL (ref 6.0–8.5)
eGFR: 108 mL/min/{1.73_m2} (ref >59)

## 2022-07-22 LAB — VITAMIN D 25 HYDROXY (VIT D DEFICIENCY, FRACTURES): Vit D, 25-Hydroxy: 26.7 ng/mL — ABNORMAL LOW (ref 30.0–100.0)

## 2022-07-22 LAB — TSH: TSH: 0.55 u[IU]/mL (ref 0.450–4.500)

## 2022-07-28 ENCOUNTER — Ambulatory Visit: Payer: Medicaid Other

## 2022-07-30 NOTE — Progress Notes (Signed)
Pt presents today for flu shot 

## 2023-02-08 ENCOUNTER — Other Ambulatory Visit: Payer: Self-pay

## 2023-02-08 ENCOUNTER — Emergency Department (HOSPITAL_COMMUNITY): Payer: 59

## 2023-02-08 ENCOUNTER — Emergency Department (HOSPITAL_COMMUNITY)
Admission: EM | Admit: 2023-02-08 | Discharge: 2023-02-08 | Disposition: A | Discharge status: Home / Self Care | Payer: 59 | Attending: Student | Admitting: Student

## 2023-02-08 ENCOUNTER — Encounter (HOSPITAL_COMMUNITY): Payer: Self-pay

## 2023-02-08 DIAGNOSIS — R29898 Other symptoms and signs involving the musculoskeletal system: Secondary | ICD-10-CM

## 2023-02-08 DIAGNOSIS — F17210 Nicotine dependence, cigarettes, uncomplicated: Secondary | ICD-10-CM | POA: Insufficient documentation

## 2023-02-08 DIAGNOSIS — Z79899 Other long term (current) drug therapy: Secondary | ICD-10-CM | POA: Diagnosis not present

## 2023-02-08 DIAGNOSIS — M542 Cervicalgia: Secondary | ICD-10-CM | POA: Insufficient documentation

## 2023-02-08 DIAGNOSIS — I10 Essential (primary) hypertension: Secondary | ICD-10-CM | POA: Insufficient documentation

## 2023-02-08 LAB — CBC WITH DIFFERENTIAL/PLATELET
Abs Immature Granulocytes: 0.01 10*3/uL (ref 0.00–0.07)
Basophils Absolute: 0 10*3/uL (ref 0.0–0.1)
Basophils Relative: 1 %
Eosinophils Absolute: 0.1 10*3/uL (ref 0.0–0.5)
Eosinophils Relative: 1 %
HCT: 41.6 % (ref 39.0–52.0)
Hemoglobin: 14.2 g/dL (ref 13.0–17.0)
Immature Granulocytes: 0 %
Lymphocytes Relative: 30 %
Lymphs Abs: 1.8 10*3/uL (ref 0.7–4.0)
MCH: 33.2 pg (ref 26.0–34.0)
MCHC: 34.1 g/dL (ref 30.0–36.0)
MCV: 97.2 fL (ref 80.0–100.0)
Monocytes Absolute: 0.4 10*3/uL (ref 0.1–1.0)
Monocytes Relative: 8 %
Neutro Abs: 3.5 10*3/uL (ref 1.7–7.7)
Neutrophils Relative %: 60 %
Platelets: 276 10*3/uL (ref 150–400)
RBC: 4.28 MIL/uL (ref 4.22–5.81)
RDW: 12.9 % (ref 11.5–15.5)
WBC: 5.9 10*3/uL (ref 4.0–10.5)
nRBC: 0 % (ref 0.0–0.2)

## 2023-02-08 LAB — COMPREHENSIVE METABOLIC PANEL
ALT: 22 U/L (ref 0–44)
AST: 45 U/L — ABNORMAL HIGH (ref 15–41)
Albumin: 4.2 g/dL (ref 3.5–5.0)
Alkaline Phosphatase: 54 U/L (ref 38–126)
Anion gap: 12 (ref 5–15)
BUN: 12 mg/dL (ref 6–20)
CO2: 21 mmol/L — ABNORMAL LOW (ref 22–32)
Calcium: 8.8 mg/dL — ABNORMAL LOW (ref 8.9–10.3)
Chloride: 100 mmol/L (ref 98–111)
Creatinine, Ser: 0.81 mg/dL (ref 0.61–1.24)
GFR, Estimated: >60 mL/min (ref >60)
Glucose, Bld: 75 mg/dL (ref 70–99)
Potassium: 5.2 mmol/L — ABNORMAL HIGH (ref 3.5–5.1)
Sodium: 133 mmol/L — ABNORMAL LOW (ref 135–145)
Total Bilirubin: 1.8 mg/dL — ABNORMAL HIGH (ref 0.3–1.2)
Total Protein: 6.7 g/dL (ref 6.5–8.1)

## 2023-02-08 MED ORDER — OXYCODONE HCL 5 MG PO TABS
5.0000 mg | ORAL_TABLET | Freq: Once | ORAL | Status: AC
  Administered 2023-02-08: 5 mg via ORAL
  Filled 2023-02-08: qty 1

## 2023-02-08 MED ORDER — AMLODIPINE BESYLATE 10 MG PO TABS
10.0000 mg | ORAL_TABLET | Freq: Every day | ORAL | 0 refills | Status: DC
Start: 2023-02-08 — End: 2024-08-04

## 2023-02-08 MED ORDER — LOSARTAN POTASSIUM 25 MG PO TABS
25.0000 mg | ORAL_TABLET | Freq: Every day | ORAL | 0 refills | Status: DC
Start: 2023-02-08 — End: 2024-08-07

## 2023-02-08 MED ORDER — CALCIUM GLUCONATE-NACL 1-0.675 GM/50ML-% IV SOLN
1.0000 g | Freq: Once | INTRAVENOUS | Status: AC
  Administered 2023-02-08: 1000 mg via INTRAVENOUS
  Filled 2023-02-08: qty 50

## 2023-02-08 MED ORDER — KETOROLAC TROMETHAMINE 15 MG/ML IJ SOLN
15.0000 mg | Freq: Once | INTRAMUSCULAR | Status: AC
  Administered 2023-02-08: 15 mg via INTRAMUSCULAR
  Filled 2023-02-08: qty 1

## 2023-02-08 NOTE — ED Provider Notes (Signed)
  Physical Exam  BP (!) 170/104   Pulse 64   Temp 98.3 F (36.8 C) (Oral)   Resp 18   Ht 5\' 10"  (1.778 m)   Wt 59 kg   SpO2 100%   BMI 18.65 kg/m   Physical Exam  Procedures  Procedures  ED Course / MDM    Medical Decision Making Care assumed at 3 pm from Dr. Posey Rea.  Patient has chronic right sided weakness and has been referred to neurology at [facility] for second opinion.  Patient is here because he had some headache and fall.  Signed out pending CT head and neck and thoracic spine and labs.  5:28 PM I reviewed patient's labs and independently interpreted imaging studies.  Patient's calcium is slightly low at 8.8.  Potassium is 5.2 but is hemolyzed.  CT head neck and thoracic spine unremarkable.  Patient has chronic right-sided weakness.  Of note, patient's blood pressure was 200 on arrival but he has not been taking his BP meds.  Now is down to 170 with Toradol and pain meds alone.  Patient was given a gram of calcium.  I told him to follow-up with neurology and PCP.  Problems Addressed: Hypertension, unspecified type: chronic illness or injury Hypocalcemia: acute illness or injury Right arm weakness: chronic illness or injury  Amount and/or Complexity of Data Reviewed Labs: ordered. Decision-making details documented in ED Course. Radiology: ordered and independent interpretation performed. Decision-making details documented in ED Course.  Risk Prescription drug management.          Charlynne Pander, MD 02/08/23 [phone]

## 2023-02-08 NOTE — Discharge Instructions (Signed)
I am refilling your blood pressure medications.  Your calcium is slightly low and you were given a dose of IV calcium.  Take Tums daily  You need to have your labs rechecked with your doctor in a week.  Also see your doctor in a week to recheck your blood pressure.  As we discussed, you should have your primary care doctor refer you to a neurologist at [facility] to further evaluate you regarding your right arm weakness  Return to ER if you have another fall, worse weakness or headache or numbness or vomiting or chest pain

## 2023-02-08 NOTE — ED Provider Notes (Signed)
[place] EMERGENCY DEPARTMENT AT [facility] Provider Note  CSN: [id number] Arrival date & time: 02/08/23 1132  Chief Complaint(s) Fall  HPI Connor Andrews is a 52 y.o. male with PMH HTN, arthritis, chronic upper and lower extremity numbness with extensive workup in June 2022 including LP who presents emergency department for evaluation of a fall, mid back pain, neck pain and worsening numbness.  Patient states that approximately 72 hours ago he suffered a mechanical fall, struck his head on the ground and now has a persistent headache.  He states that he feels that his hands and feet are "asleep" and feel a little bit worse than usual.  He states that his outpatient physicians had encouraged him to seek a second opinion at [facility] but he had transportation difficulties since then has not followed up with that appointment.  He denies weakness of the upper or lower extremities and denies chest pain, shortness of breath, abdominal pain, nausea, vomiting or other systemic neurologic complaints.   Past Medical History Past Medical History:  Diagnosis Date   Arthritis    right hip   Hypertension    Low back pain    right side   Patient Active Problem List   Diagnosis Date Noted   GAD (generalized anxiety disorder) 01/06/2022   Severe episode of recurrent major depressive disorder, without psychotic features (HCC) 01/06/2022   CSF abnormal    Weight loss    HTN (hypertension) 04/06/2021   Tobacco abuse 04/06/2021   Right sided weakness 04/06/2021   Home Medication(s) Prior to Admission medications   Medication Sig Start Date End Date Taking? Authorizing Provider  amLODipine (NORVASC) 10 MG tablet Take 1 tablet (10 mg total) by mouth daily. 02/08/23   Charlynne Pander, MD  celecoxib (CELEBREX) 50 MG capsule Take 1 capsule (50 mg total) by mouth 2 (two) times daily. Patient not taking: Reported on 02/08/2023 07/21/22   Arnette Felts, FNP  cyanocobalamin 1000 MCG tablet  Take 1 tablet (1,000 mcg total) by mouth daily. Patient not taking: Reported on 07/21/2022 06/02/21   Arnette Felts, FNP  losartan (COZAAR) 25 MG tablet Take 1 tablet (25 mg total) by mouth daily. 02/08/23   Charlynne Pander, MD  OLANZapine (ZYPREXA) 2.5 MG tablet Take 1 tablet (2.5 mg total) by mouth at bedtime. Patient not taking: Reported on 07/21/2022 03/19/22 04/18/22  Lauro Franklin, MD  pregabalin (LYRICA) 75 MG capsule Take 1 capsule (75 mg total) by mouth 2 (two) times daily. Patient not taking: Reported on 07/21/2022 10/15/21   Arnette Felts, FNP  sertraline (ZOLOFT) 50 MG tablet Take 1 tablet (50 mg total) by mouth daily. Patient not taking: Reported on 07/21/2022 03/19/22 04/18/22  Lauro Franklin, MD  thiamine (VITAMIN B1) 100 MG tablet Take 1 tablet (100 mg total) by mouth daily. Patient not taking: Reported on 02/08/2023 07/21/22   Arnette Felts, FNP  Vitamin D, Ergocalciferol, (DRISDOL) 1.25 MG (50000 UNIT) CAPS capsule Take 1 capsule (50,000 Units total) by mouth 2 (two) times a week. Patient not taking: Reported on 02/08/2023 07/23/22   Arnette Felts, FNP  Past Surgical History History reviewed. No pertinent surgical history. Family History Family History  Problem Relation Age of Onset   Hypertension Mother    Diabetes Mother    Hypertension Father     Social History Social History   Tobacco Use   Smoking status: Every Day    Packs/day: .25    Types: Cigarettes   Smokeless tobacco: Never   Tobacco comments:    07/22/20 1 pk a week     07/07/2021: Patient has been cutting down     10/15/2021 - unable to hold the cigarette so he is cutting back  Vaping Use   Vaping Use: Never used  Substance Use Topics   Alcohol use: Not Currently    Comment: weekends only   Drug use: No   Allergies Patient has no known allergies.  Review of  Systems Review of Systems  Neurological:  Positive for numbness and headaches.    Physical Exam Vital Signs  I have reviewed the triage vital signs BP (!) 156/98 (BP Location: Right Arm)   Pulse 68   Temp 98 F (36.7 C) (Oral)   Resp 16   Ht 5\' 10"  (1.778 m)   Wt 59 kg   SpO2 99%   BMI 18.65 kg/m   Physical Exam Constitutional:      General: He is not in acute distress.    Appearance: Normal appearance.  HENT:     Head: Normocephalic and atraumatic.     Nose: No congestion or rhinorrhea.  Eyes:     General:        Right eye: No discharge.        Left eye: No discharge.     Extraocular Movements: Extraocular movements intact.     Pupils: Pupils are equal, round, and reactive to light.  Cardiovascular:     Rate and Rhythm: Normal rate and regular rhythm.     Heart sounds: No murmur heard. Pulmonary:     Effort: No respiratory distress.     Breath sounds: No wheezing or rales.  Abdominal:     General: There is no distension.     Tenderness: There is no abdominal tenderness.  Musculoskeletal:        General: Normal range of motion.     Cervical back: Normal range of motion.  Skin:    General: Skin is warm and dry.  Neurological:     General: No focal deficit present.     Mental Status: He is alert.     Sensory: Sensory deficit present.     ED Results and Treatments Labs (all labs ordered are listed, but only abnormal results are displayed) Labs Reviewed  COMPREHENSIVE METABOLIC PANEL - Abnormal; Notable for the following components:      Result Value   Sodium 133 (*)    Potassium 5.2 (*)    CO2 21 (*)    Calcium 8.8 (*)    AST 45 (*)    Total Bilirubin 1.8 (*)    All other components within normal limits  CBC WITH DIFFERENTIAL/PLATELET  Radiology CT Thoracic Spine Wo Contrast  Result Date: 02/08/2023 CLINICAL DATA:  Trauma EXAM: CT  THORACIC SPINE WITHOUT CONTRAST TECHNIQUE: Multidetector CT images of the thoracic were obtained using the standard protocol without intravenous contrast. RADIATION DOSE REDUCTION: This exam was performed according to the departmental dose-optimization program which includes automated exposure control, adjustment of the mA and/or kV according to patient size and/or use of iterative reconstruction technique. COMPARISON:  MR thoracic spine done on 04/06/2021 FINDINGS: Alignment: Alignment of posterior margins of the vertebral bodies appears normal. Vertebrae: No recent fracture is seen. Degenerative changes are noted with bony spurs, more so at T9-T10 level. Paraspinal and other soft tissues: There is no central spinal stenosis. Paraspinal soft tissues are unremarkable. Multiple bullae are seen in the apices of both lungs. Centrilobular emphysema is seen in the upper lung fields. Rest of the visualized lung fields are unremarkable. Disc levels: There is no significant narrowing of neural foramina. IMPRESSION: No recent fracture is seen in thoracic spine. Electronically Signed   By: Ernie Avena M.D.   On: 02/08/2023 15:50   CT Head Wo Contrast  Result Date: 02/08/2023 CLINICAL DATA:  Head trauma, moderate-severe; Neck trauma, abnormal mental status or neuro exam (Ped 3-15y). EXAM: CT HEAD WITHOUT CONTRAST CT CERVICAL SPINE WITHOUT CONTRAST TECHNIQUE: Multidetector CT imaging of the head and cervical spine was performed following the standard protocol without intravenous contrast. Multiplanar CT image reconstructions of the cervical spine were also generated. RADIATION DOSE REDUCTION: This exam was performed according to the departmental dose-optimization program which includes automated exposure control, adjustment of the mA and/or kV according to patient size and/or use of iterative reconstruction technique. COMPARISON:  Head CT 09/06/2006. MRI brain 04/07/2021. MRI cervical spine 04/06/2021. FINDINGS: CT  HEAD FINDINGS Brain: No acute intracranial hemorrhage. Gray-white differentiation is preserved. No hydrocephalus or extra-axial collection. No mass effect or midline shift. Vascular: No hyperdense vessel or unexpected calcification. Skull: No calvarial fracture or suspicious bone lesion. Skull base is unremarkable. Sinuses/Orbits: Unremarkable. Other: None. CT CERVICAL SPINE FINDINGS Alignment: Normal. Skull base and vertebrae: No acute fracture. Intact craniocervical junction. Degenerative endplate changes at C4-5 and C6-7. Soft tissues and spinal canal: No prevertebral fluid or swelling. No visible canal hematoma. Disc levels: Mild cervical spondylosis without high-grade spinal canal stenosis. Upper chest: Emphysema in the lung apices. Other: None. IMPRESSION: 1. No acute intracranial abnormality. 2. No acute cervical spine fracture or traumatic malalignment. 3. Emphysema (ICD10-J43.9). Electronically Signed   By: Orvan Falconer M.D.   On: 02/08/2023 15:48   CT Cervical Spine Wo Contrast  Result Date: 02/08/2023 CLINICAL DATA:  Head trauma, moderate-severe; Neck trauma, abnormal mental status or neuro exam (Ped 3-15y). EXAM: CT HEAD WITHOUT CONTRAST CT CERVICAL SPINE WITHOUT CONTRAST TECHNIQUE: Multidetector CT imaging of the head and cervical spine was performed following the standard protocol without intravenous contrast. Multiplanar CT image reconstructions of the cervical spine were also generated. RADIATION DOSE REDUCTION: This exam was performed according to the departmental dose-optimization program which includes automated exposure control, adjustment of the mA and/or kV according to patient size and/or use of iterative reconstruction technique. COMPARISON:  Head CT 09/06/2006. MRI brain 04/07/2021. MRI cervical spine 04/06/2021. FINDINGS: CT HEAD FINDINGS Brain: No acute intracranial hemorrhage. Gray-white differentiation is preserved. No hydrocephalus or extra-axial collection. No mass effect or  midline shift. Vascular: No hyperdense vessel or unexpected calcification. Skull: No calvarial fracture or suspicious bone lesion. Skull base is unremarkable. Sinuses/Orbits: Unremarkable. Other: None. CT CERVICAL SPINE FINDINGS Alignment: Normal.  Skull base and vertebrae: No acute fracture. Intact craniocervical junction. Degenerative endplate changes at C4-5 and C6-7. Soft tissues and spinal canal: No prevertebral fluid or swelling. No visible canal hematoma. Disc levels: Mild cervical spondylosis without high-grade spinal canal stenosis. Upper chest: Emphysema in the lung apices. Other: None. IMPRESSION: 1. No acute intracranial abnormality. 2. No acute cervical spine fracture or traumatic malalignment. 3. Emphysema (ICD10-J43.9). Electronically Signed   By: Orvan Falconer M.D.   On: 02/08/2023 15:48    Pertinent labs & imaging results that were available during my care of the patient were reviewed by me and considered in my medical decision making (see MDM for details).  Medications Ordered in ED Medications  oxyCODONE (Oxy IR/ROXICODONE) immediate release tablet 5 mg (5 mg Oral Given 02/08/23 1555)  ketorolac (TORADOL) 15 MG/ML injection 15 mg (15 mg Intramuscular Given 02/08/23 1558)  calcium gluconate 1 g/ 50 mL sodium chloride IVPB (0 mg Intravenous Stopped 02/08/23 1840)                                                                                                                                     Procedures Procedures  (including critical care time)  Medical Decision Making / ED Course   This patient presents to the ED for concern of numbness, headache, this involves an extensive number of treatment options, and is a complaint that carries with it a high risk of complications and morbidity.  The differential diagnosis includes ICH, cervical stenosis, thoracic stenosis, fracture, mass, B12 deficiency, neurosyphilis, neuropathy  MDM: Patient seen emergency room for evaluation of  multiple complaints described above.  Physical exam with decreased 2 point discrimination in the upper and lower extremities but patient is able to feel sharp sensation at the fingertips and toes.  At time of signout, patient pending laboratory and imaging evaluation.  Please see provider signout for continuation of workup.  Anticipate discharge with outpatient follow-up if workup is unremarkable.   Additional history obtained:  -External records from outside source obtained and reviewed including: Chart review including previous notes, labs, imaging, consultation notes   Lab Tests: -I ordered, reviewed, and interpreted labs.   The pertinent results include:   Labs Reviewed  COMPREHENSIVE METABOLIC PANEL - Abnormal; Notable for the following components:      Result Value   Sodium 133 (*)    Potassium 5.2 (*)    CO2 21 (*)    Calcium 8.8 (*)    AST 45 (*)    Total Bilirubin 1.8 (*)    All other components within normal limits  CBC WITH DIFFERENTIAL/PLATELET         Imaging Studies ordered: I ordered imaging studies including CT head, C and T-spine and the studies are pending   Medicines ordered and prescription drug management: Meds ordered this encounter  Medications   oxyCODONE (Oxy IR/ROXICODONE) immediate release tablet 5 mg   ketorolac (TORADOL) 15  MG/ML injection 15 mg   calcium gluconate 1 g/ 50 mL sodium chloride IVPB   amLODipine (NORVASC) 10 MG tablet    Sig: Take 1 tablet (10 mg total) by mouth daily.    Dispense:  90 tablet    Refill:  0   losartan (COZAAR) 25 MG tablet    Sig: Take 1 tablet (25 mg total) by mouth daily.    Dispense:  30 tablet    Refill:  0    -I have reviewed the patients home medicines and have made adjustments as needed  Critical interventions none   Social Determinants of Health:  Factors impacting patients care include: none   Reevaluation: After the interventions noted above, I reevaluated the patient and found that they  have :stayed the same  Co morbidities that complicate the patient evaluation  Past Medical History:  Diagnosis Date   Arthritis    right hip   Hypertension    Low back pain    right side      Dispostion: I considered admission for this patient, and disposition pending completion of laboratory and imaging evaluation.  Please see provider signout for continuation of workup.     Final Clinical Impression(s) / ED Diagnoses Final diagnoses:  Right arm weakness  Hypocalcemia  Hypertension, unspecified type     @PCDICTATION @    Glendora Score, MD 02/08/23 2017

## 2023-02-08 NOTE — ED Notes (Signed)
Patient transported to CT 

## 2023-02-08 NOTE — ED Triage Notes (Signed)
Pt arrived POV stating he was here for multiple problems he has had back pain x2 years. Pt states he has not been able to feel his hands and arms for over 2 years. Pt states he fell Friday and hit his head and now has a headache. Pt also states he has issues using the restroom since he can't feel his hands.

## 2023-02-08 NOTE — ED Notes (Signed)
Pt stated it is difficult to make his right arm

## 2023-02-15 ENCOUNTER — Telehealth: Payer: Self-pay

## 2023-02-15 NOTE — Transitions of Care (Post Inpatient/ED Visit) (Signed)
   02/15/2023  Name: Connor Andrews MRN: [id number] DOB: Nov 23, 1970  Today's TOC FU Call Status: Today's TOC FU Call Status:: Unsuccessul Call (1st Attempt) Unsuccessful Call (1st Attempt) Date: 02/15/23  Attempted to reach the patient regarding the most recent Inpatient/ED visit.  Follow Up Plan: Additional outreach attempts will be made to reach the patient to complete the Transitions of Care (Post Inpatient/ED visit) call.   Signature YL,RMA

## 2023-12-11 ENCOUNTER — Emergency Department (HOSPITAL_COMMUNITY)

## 2023-12-11 ENCOUNTER — Emergency Department (HOSPITAL_COMMUNITY)
Admission: EM | Admit: 2023-12-11 | Discharge: 2023-12-12 | Disposition: A | Discharge status: Home / Self Care | Attending: Emergency Medicine | Admitting: Emergency Medicine

## 2023-12-11 DIAGNOSIS — R0602 Shortness of breath: Secondary | ICD-10-CM | POA: Diagnosis not present

## 2023-12-11 DIAGNOSIS — R0789 Other chest pain: Secondary | ICD-10-CM | POA: Diagnosis present

## 2023-12-11 DIAGNOSIS — G0491 Myelitis, unspecified: Secondary | ICD-10-CM | POA: Diagnosis not present

## 2023-12-11 DIAGNOSIS — R531 Weakness: Secondary | ICD-10-CM

## 2023-12-11 LAB — BASIC METABOLIC PANEL
Anion gap: 12 (ref 5–15)
BUN: 13 mg/dL (ref 6–20)
CO2: 23 mmol/L (ref 22–32)
Calcium: 9.4 mg/dL (ref 8.9–10.3)
Chloride: 104 mmol/L (ref 98–111)
Creatinine, Ser: 0.52 mg/dL — ABNORMAL LOW (ref 0.61–1.24)
GFR, Estimated: >60 mL/min (ref >60)
Glucose, Bld: 93 mg/dL (ref 70–99)
Potassium: 3.8 mmol/L (ref 3.5–5.1)
Sodium: 139 mmol/L (ref 135–145)

## 2023-12-11 LAB — CBC
HCT: 45.9 % (ref 39.0–52.0)
Hemoglobin: 14.9 g/dL (ref 13.0–17.0)
MCH: 31.5 pg (ref 26.0–34.0)
MCHC: 32.5 g/dL (ref 30.0–36.0)
MCV: 97 fL (ref 80.0–100.0)
Platelets: 273 10*3/uL (ref 150–400)
RBC: 4.73 MIL/uL (ref 4.22–5.81)
RDW: 12.4 % (ref 11.5–15.5)
WBC: 4.3 10*3/uL (ref 4.0–10.5)
nRBC: 0 % (ref 0.0–0.2)

## 2023-12-11 LAB — D-DIMER, QUANTITATIVE: D-Dimer, Quant: 0.31 ug{FEU}/mL (ref 0.00–0.50)

## 2023-12-11 LAB — TROPONIN I (HIGH SENSITIVITY)
Troponin I (High Sensitivity): 3 ng/L (ref <18)
Troponin I (High Sensitivity): 3 ng/L (ref <18)

## 2023-12-11 MED ORDER — DIPHENHYDRAMINE HCL 50 MG/ML IJ SOLN
25.0000 mg | Freq: Once | INTRAMUSCULAR | Status: AC
  Administered 2023-12-11: 25 mg via INTRAVENOUS
  Filled 2023-12-11: qty 1

## 2023-12-11 MED ORDER — PROCHLORPERAZINE EDISYLATE 10 MG/2ML IJ SOLN
10.0000 mg | Freq: Once | INTRAMUSCULAR | Status: AC
  Administered 2023-12-11: 10 mg via INTRAVENOUS
  Filled 2023-12-11: qty 2

## 2023-12-11 MED ORDER — GADOBUTROL 1 MMOL/ML IV SOLN
6.0000 mL | Freq: Once | INTRAVENOUS | Status: AC | PRN
  Administered 2023-12-11: 6 mL via INTRAVENOUS

## 2023-12-11 NOTE — ED Triage Notes (Addendum)
 Patient reports chest pain, center Light headed x 2 days Can't move right arm or right hand or foot x several years now Says it all came from his back. Says if he tilts head forward he gets sharp pain up to his head and down his leg Says he was hospitalized prior for same Pain rated 9/10 - right side of body  Patient bp in triage elevated, says he used to take medicine but he stopped taking it without provider being notified saying it made him feel funny

## 2023-12-12 NOTE — Discharge Instructions (Addendum)
 The workup in the emergency room is reassuring.  Your MRI shows evidence of previous injury to your spine.  You must follow-up with neurologist in the outpatient setting, given the progression of your symptoms.  We have put in a referral to [facility] neurology for that purpose.  Your cardiac and pulmonary workup is normal.  No signs of any heart attack, blood clots in the lungs or infection.

## 2023-12-12 NOTE — ED Provider Notes (Signed)
 [place] EMERGENCY DEPARTMENT AT [facility] Provider Note   CSN: [id number] Arrival date & time: 12/11/23  1033     History  Chief Complaint  Patient presents with   Chest Pain    Connor Andrews is a 53 y.o. male.  HPI    53 year old male comes in with chief complaint of chest pain, dizziness.  He also complains of persistent bilateral upper and lower extremity weakness and numbness, worse on the right side which is at baseline, but states that more recently he started noticing some new numbness and weakness over the left side.  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  Subsequently he followed up with outpatient neurology, but there was no clarity on his diagnosis.  He was also given follow-up with [facility] neurology at some point, but they gave him " runaround" so he has not followed up with any neurology since his admission in 2022.  Patient states that at baseline he is not very functional and most of the day he is sitting or laying on a couch.  He lives with his parents.  Today he started noticing some chest discomfort that was central, nonradiating and he had some lightheadedness and shortness of breath, prompting him to come to the ER.  Chest pain is not worse with any inspiration.  He has no cough, URI-like symptoms.  No history of similar symptoms in the past.  Denies any smoking, substance use disorder.  No history of PE, DVT.   Home Medications Prior to Admission medications   Medication Sig Start Date End Date Taking? Authorizing Provider  amLODipine (NORVASC) 10 MG tablet Take 1 tablet (10 mg total) by mouth daily. 02/08/23   Charlynne Pander, MD  celecoxib (CELEBREX) 50 MG capsule Take 1 capsule (50 mg total) by mouth 2 (two) times daily. Patient not taking: Reported on 02/08/2023 07/21/22   Arnette Felts, FNP  cyanocobalamin 1000 MCG tablet Take 1 tablet (1,000 mcg total) by mouth daily. Patient not  taking: Reported on 07/21/2022 06/02/21   Arnette Felts, FNP  losartan (COZAAR) 25 MG tablet Take 1 tablet (25 mg total) by mouth daily. 02/08/23   Charlynne Pander, MD  OLANZapine (ZYPREXA) 2.5 MG tablet Take 1 tablet (2.5 mg total) by mouth at bedtime. Patient not taking: Reported on 07/21/2022 03/19/22 04/18/22  Lauro Franklin, MD  pregabalin (LYRICA) 75 MG capsule Take 1 capsule (75 mg total) by mouth 2 (two) times daily. Patient not taking: Reported on 07/21/2022 10/15/21   Arnette Felts, FNP  sertraline (ZOLOFT) 50 MG tablet Take 1 tablet (50 mg total) by mouth daily. Patient not taking: Reported on 07/21/2022 03/19/22 04/18/22  Lauro Franklin, MD  thiamine (VITAMIN B1) 100 MG tablet Take 1 tablet (100 mg total) by mouth daily. Patient not taking: Reported on 02/08/2023 07/21/22   Arnette Felts, FNP  Vitamin D, Ergocalciferol, (DRISDOL) 1.25 MG (50000 UNIT) CAPS capsule Take 1 capsule (50,000 Units total) by mouth 2 (two) times a week. Patient not taking: Reported on 02/08/2023 07/23/22   Arnette Felts, FNP      Allergies    Patient has no known allergies.    Review of Systems   Review of Systems  All other systems reviewed and are negative.   Physical Exam Updated Vital Signs BP (!) 153/111   Pulse (!) 54   Temp (!) 97.4 F (36.3 C) (Oral)   Resp 14   Ht 5'  10" (1.778 m)   Wt 61.2 kg   SpO2 100%   BMI 19.37 kg/m  Physical Exam Vitals and nursing note reviewed.  Constitutional:      Appearance: He is well-developed.  HENT:     Head: Atraumatic.  Cardiovascular:     Rate and Rhythm: Normal rate.  Pulmonary:     Effort: Pulmonary effort is normal.     Breath sounds: Normal breath sounds. No decreased breath sounds, wheezing, rhonchi or rales.  Musculoskeletal:     Cervical back: Neck supple.  Skin:    General: Skin is warm.  Neurological:     Mental Status: He is alert and oriented to person, place, and time.     Comments: Patient has subjective paresthesias  and numbness to the right upper extremity and right lower extremity.  Patient has profound numbness over the external part of the legs bilaterally, below the knee.  His upper extremity strength is 4+ out of 5 and his lower extremity strength is also 4+ out of 5.  The right upper extremity does have some contraction over the wrist.     ED Results / Procedures / Treatments   Labs (all labs ordered are listed, but only abnormal results are displayed) Labs Reviewed  BASIC METABOLIC PANEL - Abnormal; Notable for the following components:      Result Value   Creatinine, Ser 0.52 (*)    All other components within normal limits  CBC  D-DIMER, QUANTITATIVE  TROPONIN I (HIGH SENSITIVITY)  TROPONIN I (HIGH SENSITIVITY)    EKG EKG Interpretation Date/Time:  Saturday December 11 2023 10:41:50 EST Ventricular Rate:  78 PR Interval:  147 QRS Duration:  85 QT Interval:  388 QTC Calculation: 442 R Axis:   -61  Text Interpretation: Sinus arrhythmia Probable left atrial enlargement  No significant change from prior tracing May 2017 Confirmed by Alvester Chou [phone]) on 12/11/2023 1:05:11 PM  Radiology DG Chest 2 View Result Date: 12/11/2023 CLINICAL DATA:  Chest pain and lightheadedness for 2 days. EXAM: CHEST - 2 VIEW COMPARISON:  04/11/2021 FINDINGS: Heart size and mediastinal contours appear normal. There is no pleural fluid, interstitial edema, or airspace consolidation. The visualized osseous structures are unremarkable. IMPRESSION: No active cardiopulmonary disease. Electronically Signed   By: Signa Kell M.D.   On: 12/11/2023 11:36    Procedures Procedures    Medications Ordered in ED Medications  prochlorperazine (COMPAZINE) injection 10 mg (10 mg Intravenous Given 12/11/23 1831)  diphenhydrAMINE (BENADRYL) injection 25 mg (25 mg Intravenous Given 12/11/23 1831)  gadobutrol (GADAVIST) 1 MMOL/ML injection 6 mL (6 mLs Intravenous Contrast Given 12/11/23 2202)    ED Course/ Medical  Decision Making/ A&P                                 Medical Decision Making Amount and/or Complexity of Data Reviewed Labs: ordered. Radiology: ordered.  Risk Prescription drug management.   53 year old male with previous history of myelitis comes in with chief complaint of chest pain, shortness of breath but also indicates that his chronic numbness and weakness is worse, now affecting left side more than it was in the past.  It appears that his neurologic symptoms are still subacute in nature.  Differential diagnosis considered for this patient includes acute PE, COVID-19/flu, dehydration, new renal failure, electrolyte abnormality, dysrhythmia, acute coronary syndrome.  Worsening myelitis and new demyelinating process also considered in the differential diagnosis.  I have reviewed patient's records including his previous MRI, care everywhere and his neurologic visits.  It appears that the neurologist in the outpatient setting wanted to get a repeat MRI of the C-spine, but that never was completed.  Patient was also supposed to follow-up with [facility] clinic at [facility], but that did not get accomplished.  We will get basic labs right now, MRI brain and MRI C-spine with and without contrast.  We will get basic labs including delta troponin and D-dimer.  Patient's O2 sats are at 100% on room air on my assessment and there is no tachycardia.  No signs of DVT.  If patient's MRI is concerning, then we will need to consult neurology.  If the MRI is reassuring, then we will give him outpatient neurology referral to [facility] neurology.   1:04 AM Admission was considered for this patient, however MRI appears to be stable. Outpatient neurology referral placed.  The patient appears reasonably screened and/or stabilized for discharge and I doubt any other medical condition or other [facility] requiring further screening, evaluation, or treatment in the ED at this time prior to discharge.   Results from  the ER workup discussed with the patient face to face and all questions answered to the best of my ability. The patient is safe for discharge with strict return precautions.  Final Clinical Impression(s) / ED Diagnoses Final diagnoses:  Right sided weakness  Myelitis (HCC)    Rx / DC Orders ED Discharge Orders          Ordered    Ambulatory referral to Neurology       Comments: An appointment is requested in approximately: 2 weeks   12/12/23 0044              Derwood Kaplan, MD 12/12/23 0104

## 2023-12-14 ENCOUNTER — Telehealth: Payer: Self-pay

## 2023-12-14 NOTE — Transitions of Care (Post Inpatient/ED Visit) (Signed)
   12/14/2023  Name: Connor Andrews MRN: [id number] DOB: Oct 02, 1971  Today's TOC FU Call Status: Today's TOC FU Call Status:: Unsuccessful Call (1st Attempt) Unsuccessful Call (1st Attempt) Date: 12/14/23  Attempted to reach the patient regarding the most recent Inpatient/ED visit.  Follow Up Plan: Additional outreach attempts will be made to reach the patient to complete the Transitions of Care (Post Inpatient/ED visit) call.   Signature  Lisabeth Devoid, [place]

## 2023-12-15 ENCOUNTER — Telehealth: Payer: Self-pay

## 2023-12-15 NOTE — Transitions of Care (Post Inpatient/ED Visit) (Signed)
   12/15/2023  Name: Connor Andrews MRN: [id number] DOB: Apr 13, 1971  Today's TOC FU Call Status: Today's TOC FU Call Status:: Unsuccessful Call (3rd Attempt) Unsuccessful Call (3rd Attempt) Date: 12/15/23  Attempted to reach the patient regarding the most recent Inpatient/ED visit.  Follow Up Plan: No further outreach attempts will be made at this time. We have been unable to contact the patient.  Signature Lisabeth Devoid, [place]

## 2023-12-15 NOTE — Transitions of Care (Post Inpatient/ED Visit) (Signed)
   12/15/2023  Name: Connor Andrews MRN: [id number] DOB: 1970/12/26  Today's TOC FU Call Status: Today's TOC FU Call Status:: Unsuccessful Call (2nd Attempt) Unsuccessful Call (2nd Attempt) Date: 12/15/23  Attempted to reach the patient regarding the most recent Inpatient/ED visit.  Follow Up Plan: Additional outreach attempts will be made to reach the patient to complete the Transitions of Care (Post Inpatient/ED visit) call.   Signature Lisabeth Devoid, [place]

## 2024-01-21 DIAGNOSIS — N281 Cyst of kidney, acquired: Secondary | ICD-10-CM | POA: Diagnosis not present

## 2024-01-21 DIAGNOSIS — M5126 Other intervertebral disc displacement, lumbar region: Secondary | ICD-10-CM | POA: Diagnosis not present

## 2024-01-21 DIAGNOSIS — M47816 Spondylosis without myelopathy or radiculopathy, lumbar region: Secondary | ICD-10-CM | POA: Diagnosis not present

## 2024-01-21 DIAGNOSIS — M47817 Spondylosis without myelopathy or radiculopathy, lumbosacral region: Secondary | ICD-10-CM | POA: Diagnosis not present

## 2024-02-09 DIAGNOSIS — Z0100 Encounter for examination of eyes and vision without abnormal findings: Secondary | ICD-10-CM | POA: Diagnosis not present

## 2024-02-10 DIAGNOSIS — M5412 Radiculopathy, cervical region: Secondary | ICD-10-CM | POA: Diagnosis not present

## 2024-02-10 DIAGNOSIS — M5417 Radiculopathy, lumbosacral region: Secondary | ICD-10-CM | POA: Diagnosis not present

## 2024-04-27 ENCOUNTER — Ambulatory Visit: Payer: Medicare (Managed Care) | Admitting: Family Medicine

## 2024-04-27 DIAGNOSIS — M6281 Muscle weakness (generalized): Secondary | ICD-10-CM | POA: Diagnosis not present

## 2024-04-27 DIAGNOSIS — R2689 Other abnormalities of gait and mobility: Secondary | ICD-10-CM | POA: Diagnosis not present

## 2024-04-27 DIAGNOSIS — R42 Dizziness and giddiness: Secondary | ICD-10-CM | POA: Diagnosis not present

## 2024-04-29 DIAGNOSIS — G373 Acute transverse myelitis in demyelinating disease of central nervous system: Secondary | ICD-10-CM | POA: Diagnosis not present

## 2024-04-29 DIAGNOSIS — D649 Anemia, unspecified: Secondary | ICD-10-CM | POA: Diagnosis not present

## 2024-04-29 DIAGNOSIS — G4730 Sleep apnea, unspecified: Secondary | ICD-10-CM | POA: Diagnosis not present

## 2024-04-29 DIAGNOSIS — Z13818 Encounter for screening for other digestive system disorders: Secondary | ICD-10-CM | POA: Diagnosis not present

## 2024-04-29 DIAGNOSIS — E559 Vitamin D deficiency, unspecified: Secondary | ICD-10-CM | POA: Diagnosis not present

## 2024-04-29 DIAGNOSIS — Z09 Encounter for follow-up examination after completed treatment for conditions other than malignant neoplasm: Secondary | ICD-10-CM | POA: Diagnosis not present

## 2024-04-29 DIAGNOSIS — D803 Selective deficiency of immunoglobulin G [IgG] subclasses: Secondary | ICD-10-CM | POA: Diagnosis not present

## 2024-04-29 DIAGNOSIS — Z1159 Encounter for screening for other viral diseases: Secondary | ICD-10-CM | POA: Diagnosis not present

## 2024-05-06 DIAGNOSIS — M4802 Spinal stenosis, cervical region: Secondary | ICD-10-CM | POA: Diagnosis not present

## 2024-05-06 DIAGNOSIS — R202 Paresthesia of skin: Secondary | ICD-10-CM | POA: Diagnosis not present

## 2024-05-06 DIAGNOSIS — G9589 Other specified diseases of spinal cord: Secondary | ICD-10-CM | POA: Diagnosis not present

## 2024-05-06 DIAGNOSIS — M47812 Spondylosis without myelopathy or radiculopathy, cervical region: Secondary | ICD-10-CM | POA: Diagnosis not present

## 2024-05-06 DIAGNOSIS — M2578 Osteophyte, vertebrae: Secondary | ICD-10-CM | POA: Diagnosis not present

## 2024-05-16 ENCOUNTER — Ambulatory Visit: Payer: Medicare (Managed Care) | Admitting: Nurse Practitioner

## 2024-05-25 DIAGNOSIS — G35 Multiple sclerosis: Secondary | ICD-10-CM | POA: Diagnosis not present

## 2024-05-25 DIAGNOSIS — D803 Selective deficiency of immunoglobulin G [IgG] subclasses: Secondary | ICD-10-CM | POA: Diagnosis not present

## 2024-07-25 ENCOUNTER — Ambulatory Visit: Payer: Medicare (Managed Care) | Admitting: Nurse Practitioner

## 2024-07-25 ENCOUNTER — Encounter: Payer: Self-pay | Admitting: Nurse Practitioner

## 2024-07-25 VITALS — BP 130/74 | HR 85 | Temp 98.9°F | Ht 70.0 in | Wt 135.6 lb

## 2024-07-25 DIAGNOSIS — G35D Multiple sclerosis, unspecified: Secondary | ICD-10-CM

## 2024-07-25 DIAGNOSIS — Z139 Encounter for screening, unspecified: Secondary | ICD-10-CM | POA: Diagnosis not present

## 2024-07-25 DIAGNOSIS — Z2821 Immunization not carried out because of patient refusal: Secondary | ICD-10-CM | POA: Diagnosis not present

## 2024-07-25 DIAGNOSIS — Z720 Tobacco use: Secondary | ICD-10-CM | POA: Diagnosis not present

## 2024-07-25 DIAGNOSIS — I10 Essential (primary) hypertension: Secondary | ICD-10-CM | POA: Diagnosis not present

## 2024-07-25 NOTE — Progress Notes (Signed)
 LILLETTE Kristeen JINNY Gladis, CMA,acting as a Neurosurgeon for Gaines Ada, FNP.,have documented all relevant documentation on the behalf of Gaines Ada, FNP,as directed by  Gaines Ada, FNP while in the presence of Gaines Ada, FNP.  Subjective:  Patient ID: Connor Andrews , male    DOB: Mar 17, 1971 , 53 y.o.   MRN: [id number]  Chief Complaint  Patient presents with  . Multiple Sclerosis    Patient reports he was diagnosed with MS in [place] about a month ago. He reports he is having his records faxed over. He reports he does need MS infusions.      HPI   Past Medical History:  Diagnosis Date  . Arthritis    right hip  . Hypertension   . Low back pain    right side     Family History  Problem Relation Age of Onset  . Hypertension Mother   . Diabetes Mother   . Hypertension Father      Current Outpatient Medications:  .  amLODipine  (NORVASC ) 10 MG tablet, Take 1 tablet (10 mg total) by mouth daily., Disp: 90 tablet, Rfl: 0 .  losartan  (COZAAR ) 25 MG tablet, Take 1 tablet (25 mg total) by mouth daily., Disp: 30 tablet, Rfl: 0 .  celecoxib  (CELEBREX ) 50 MG capsule, Take 1 capsule (50 mg total) by mouth 2 (two) times daily. (Patient not taking: Reported on 07/25/2024), Disp: 60 capsule, Rfl: 1 .  cyanocobalamin  1000 MCG tablet, Take 1 tablet (1,000 mcg total) by mouth daily. (Patient not taking: Reported on 07/25/2024), Disp: 30 tablet, Rfl: 1 .  OLANZapine  (ZYPREXA ) 2.5 MG tablet, Take 1 tablet (2.5 mg total) by mouth at bedtime. (Patient not taking: Reported on 07/25/2024), Disp: 30 tablet, Rfl: 0 .  pregabalin  (LYRICA ) 75 MG capsule, Take 1 capsule (75 mg total) by mouth 2 (two) times daily. (Patient not taking: Reported on 07/25/2024), Disp: 60 capsule, Rfl: 5 .  sertraline  (ZOLOFT ) 50 MG tablet, Take 1 tablet (50 mg total) by mouth daily. (Patient not taking: Reported on 07/25/2024), Disp: 30 tablet, Rfl: 0 .  thiamine  (VITAMIN B1) 100 MG tablet, Take 1 tablet (100 mg total) by mouth daily.  (Patient not taking: Reported on 07/25/2024), Disp: 30 tablet, Rfl: 1 .  Vitamin D , Ergocalciferol , (DRISDOL ) 1.25 MG (50000 UNIT) CAPS capsule, Take 1 capsule (50,000 Units total) by mouth 2 (two) times a week. (Patient not taking: Reported on 07/25/2024), Disp: 24 capsule, Rfl: 1   No Known Allergies   Review of Systems  Constitutional: Negative.   Respiratory: Negative.    Cardiovascular: Negative.   Neurological: Negative.   Psychiatric/Behavioral: Negative.       Today's Vitals   07/25/24 1207  BP: 130/74  Pulse: 85  Temp: 98.9 F (37.2 C)  TempSrc: Oral  Weight: 135 lb 9.6 oz (61.5 kg)  Height: 5' 10 (1.778 m)  PainSc: 9   PainLoc: Back   Body mass index is 19.46 kg/m.  Wt Readings from Last 3 Encounters:  07/25/24 135 lb 9.6 oz (61.5 kg)  12/11/23 135 lb (61.2 kg)  02/08/23 130 lb (59 kg)    The ASCVD Risk score (Arnett DK, et al., 2019) failed to calculate for the following reasons:   Cannot find a previous HDL lab   Cannot find a previous total cholesterol lab  Objective:  Physical Exam      Assessment And Plan:  MS (multiple sclerosis) -     Ambulatory referral to Neurology  Primary hypertension -  BMP8+eGFR  Influenza vaccination declined  Herpes zoster vaccination declined  Tetanus, diphtheria, and acellular pertussis (Tdap) vaccination declined  Tobacco abuse  Encounter for screening -     Hepatitis B surface antibody,qualitative    Return in about 5 months (around 12/23/2024) for PHY WHEN ABLE, NEEDS AWV.  Patient was given opportunity to ask questions. Patient verbalized understanding of the plan and was able to repeat key elements of the plan. All questions were answered to their satisfaction.    LILLETTE Gaines Ada, FNP, have reviewed all documentation for this visit. The documentation on 07/25/24 for the exam, diagnosis, procedures, and orders are all accurate and complete.   IF YOU HAVE BEEN REFERRED TO A SPECIALIST, IT MAY TAKE 1-2  WEEKS TO SCHEDULE/PROCESS THE REFERRAL. IF YOU HAVE NOT HEARD FROM US /SPECIALIST IN TWO WEEKS, PLEASE GIVE US  A CALL AT [phone].

## 2024-07-26 ENCOUNTER — Telehealth: Payer: Self-pay

## 2024-07-26 DIAGNOSIS — G35D Multiple sclerosis, unspecified: Secondary | ICD-10-CM | POA: Insufficient documentation

## 2024-07-26 NOTE — Assessment & Plan Note (Signed)
 Diagnosed with MS by a neurologist in [place] . Awaiting medical records to proceed with local treatment. - Request medical records from [place]  to confirm MS diagnosis and treatment plan. - Place urgent referral to [facility] for initiation of MS infusion therapy. - Verify medication details from [place]  neurologist to ensure continuity of care. - Follow up with neurology clinic to expedite scheduling of infusion.

## 2024-07-26 NOTE — Assessment & Plan Note (Signed)
 Blood pressure is controlled. Continue blood pressure medications.

## 2024-07-26 NOTE — Telephone Encounter (Signed)
 Care everywhere search

## 2024-08-02 ENCOUNTER — Encounter: Payer: Self-pay | Admitting: Diagnostic Neuroimaging

## 2024-08-02 ENCOUNTER — Ambulatory Visit (INDEPENDENT_AMBULATORY_CARE_PROVIDER_SITE_OTHER): Payer: Medicare (Managed Care) | Admitting: Diagnostic Neuroimaging

## 2024-08-02 VITALS — BP 160/96 | HR 63 | Ht 70.0 in | Wt 134.8 lb

## 2024-08-02 DIAGNOSIS — G379 Demyelinating disease of central nervous system, unspecified: Secondary | ICD-10-CM

## 2024-08-02 DIAGNOSIS — G35A Relapsing-remitting multiple sclerosis: Secondary | ICD-10-CM

## 2024-08-02 NOTE — Patient Instructions (Signed)
-   check labs - repeat lumbar puncture - then consider ocrevus, kesimpta, tysabri

## 2024-08-02 NOTE — Progress Notes (Signed)
 [facility]  PATIENT: Connor Andrews DOB: November 06, 1970  REFERRING CLINICIAN: Georgina Speaks, FNP HISTORY FROM: patient  REASON FOR VISIT: new consult    HISTORICAL  CHIEF COMPLAINT:  Chief Complaint  Patient presents with   New Patient (Initial Visit)    Pt in 7 alone Pt here for TOC of MS     HISTORY OF PRESENT ILLNESS:   UPDATE (08/02/24, VRP): Since last visit, patient was lost to follow-up here.  He went to the hospital in June 2022 due to ongoing right-sided weakness.  He had MRI of the brain and cervical spine and was found to have abnormal lesions suspicious for acute on chronic autoimmune, inflammatory or demyelinating disease.  He was treated empirically with IV steroids.  He followed up with a different neurologist in 2022 (Dr. Tobie).  Patient was referred to another neurologist in [place], and was referred to another neuro immunologist but patient never followed up.  Ultimately he moved to [place]  where he continued to get some neurologic evaluation and treatment.  He saw 2 more neurologist there and eventually was diagnosed with probable multiple sclerosis and was getting ready to start on Ocrevus therapy.  However before he could start this therapy he moved back to [place] to live with his parents.  Patient now living here and trying to get reestablished to have definitive diagnosis and treatment.  Patient have ongoing issues with right greater than left sided weakness, pain issues, balance walking issues.   PRIOR HPI (07/22/20, VRP): 53 year old male here for evaluation of low back pain.  Patient has had 1 year of neck pain, low back pain, right upper extremity and right lower extremity numbness, pain and weakness.  His right hand is clenched and having weakness.  He has numbness in his right hand.  He is dropping things.  He is not able to control his right hand.  Patient had MRI of the lumbar spine which showed some degenerative changes  at L4-5 and L5-S1 without significant neural foraminal stenosis.   REVIEW OF SYSTEMS: Full 14 system review of systems performed and negative with exception of: as per HPI.    ALLERGIES: No Known Allergies  HOME MEDICATIONS: Outpatient Medications Prior to Visit  Medication Sig Dispense Refill   pregabalin  (LYRICA ) 75 MG capsule Take 1 capsule (75 mg total) by mouth 2 (two) times daily. 60 capsule 5   amLODipine  (NORVASC ) 10 MG tablet Take 1 tablet (10 mg total) by mouth daily. 90 tablet 0   celecoxib  (CELEBREX ) 50 MG capsule Take 1 capsule (50 mg total) by mouth 2 (two) times daily. (Patient not taking: Reported on 08/04/2024) 60 capsule 1   cyanocobalamin  1000 MCG tablet Take 1 tablet (1,000 mcg total) by mouth daily. (Patient not taking: Reported on 08/04/2024) 30 tablet 1   losartan  (COZAAR ) 25 MG tablet Take 1 tablet (25 mg total) by mouth daily. (Patient not taking: Reported on 08/04/2024) 30 tablet 0   OLANZapine  (ZYPREXA ) 2.5 MG tablet Take 1 tablet (2.5 mg total) by mouth at bedtime. (Patient not taking: Reported on 08/04/2024) 30 tablet 0   sertraline  (ZOLOFT ) 50 MG tablet Take 1 tablet (50 mg total) by mouth daily. (Patient not taking: Reported on 08/04/2024) 30 tablet 0   thiamine  (VITAMIN B1) 100 MG tablet Take 1 tablet (100 mg total) by mouth daily. (Patient not taking: Reported on 08/04/2024) 30 tablet 1   Vitamin D , Ergocalciferol , (DRISDOL ) 1.25 MG (50000 UNIT) CAPS capsule Take 1 capsule (50,000 Units total)  by mouth 2 (two) times a week. (Patient not taking: Reported on 08/04/2024) 24 capsule 1   No facility-administered medications prior to visit.    PAST MEDICAL HISTORY: Past Medical History:  Diagnosis Date   Arthritis    right hip   Hypertension    Low back pain    right side    PAST SURGICAL HISTORY: History reviewed. No pertinent surgical history.  FAMILY HISTORY: Family History  Problem Relation Age of Onset   Hypertension Mother    Diabetes Mother     Hypertension Father    Multiple sclerosis Neg Hx     SOCIAL HISTORY: Social History   Socioeconomic History   Marital status: Married    Spouse name: Edsel   Number of children: 2   Years of education: Not on file   Highest education level: GED or equivalent  Occupational History    Comment: 07/22/20 out of work d/t symptoms  Tobacco Use   Smoking status: Former    Current packs/day: 0.25    Types: Cigarettes   Smokeless tobacco: Never   Tobacco comments:    07/22/20 1 pk a week     07/07/2021: Patient has been cutting down     10/15/2021 - unable to hold the cigarette so he is cutting back  Vaping Use   Vaping status: Never Used  Substance and Sexual Activity   Alcohol use: Not Currently    Comment: weekends only   Drug use: No   Sexual activity: Yes    Partners: Female  Other Topics Concern   Not on file  Social History Narrative   Lives with wife   Right handed    Lives in a one story home    Disabled    Social Drivers of Health   Financial Resource Strain: High Risk (08/04/2024)   Overall Financial Resource Strain (CARDIA)    Difficulty of Paying Living Expenses: Very hard  Food Insecurity: Food Insecurity Present (08/04/2024)   Hunger Vital Sign    Worried About Running Out of Food in the Last Year: Often true    Ran Out of Food in the Last Year: Often true  Transportation Needs: No Transportation Needs (08/04/2024)   PRAPARE - Administrator, Civil Service (Medical): No    Lack of Transportation (Non-Medical): No  Physical Activity: Inactive (08/04/2024)   Exercise Vital Sign    Days of Exercise per Week: 0 days    Minutes of Exercise per Session: 0 min  Stress: Stress Concern Present (08/04/2024)   [employer] of Occupational Health - Occupational Stress Questionnaire    Feeling of Stress: Very much  Social Connections: Socially Isolated (08/04/2024)   Social Connection and Isolation Panel    Frequency of Communication with  Friends and Family: Once a week    Frequency of Social Gatherings with Friends and Family: Never    Attends Religious Services: Never    Database Administrator or Organizations: No    Attends Banker Meetings: Never    Marital Status: Separated  Intimate Partner Violence: Not At Risk (08/04/2024)   Humiliation, Afraid, Rape, and Kick questionnaire    Fear of Current or Ex-Partner: No    Emotionally Abused: No    Physically Abused: No    Sexually Abused: No     PHYSICAL EXAM  GENERAL EXAM/CONSTITUTIONAL: Vitals:  Vitals:   08/02/24 0905 08/02/24 0924  BP: (!) 158/100 (!) 160/96  Pulse: 63   Weight: 134 lb  12.8 oz (61.1 kg)   Height: 5' 10 (1.778 m)    Body mass index is 19.34 kg/m. Wt Readings from Last 3 Encounters:  08/02/24 134 lb 12.8 oz (61.1 kg)  07/25/24 135 lb 9.6 oz (61.5 kg)  12/11/23 135 lb (61.2 kg)   Patient is in no distress; well developed, nourished and groomed; neck is supple  CARDIOVASCULAR: Examination of carotid arteries is normal; no carotid bruits Regular rate and rhythm, no murmurs Examination of peripheral vascular system by observation and palpation is normal  EYES: Ophthalmoscopic exam of optic discs and posterior segments is normal; no papilledema or hemorrhages No results found.  MUSCULOSKELETAL: Gait, strength, tone, movements noted in Neurologic exam below  NEUROLOGIC: MENTAL STATUS:      No data to display         awake, alert, oriented to person, place and time recent and remote memory intact normal attention and concentration language fluent, comprehension intact, naming intact fund of knowledge appropriate  CRANIAL NERVE:  2nd - no papilledema on fundoscopic exam 2nd, 3rd, 4th, 6th - pupils equal and reactive to light, visual fields full to confrontation, extraocular muscles intact, no nystagmus 5th - facial sensation symmetric 7th - facial strength symmetric 8th - hearing intact 9th - palate elevates  symmetrically, uvula midline 11th - shoulder shrug symmetric 12th - tongue protrusion midline  MOTOR:  INCREASED TONE IN BUE, BLE RUE: DELTOID 5, TRICEPS 3, BICEPS 4, GRIP 3 LUE: DELTOID 5, TRICEPS 4, BICEPS 4, GRIP 3+ RLE: HF 4-, KE / KF 4+, DF 3 LLE: HF 4, KE / KF 4, DF 4  SENSORY:  ABSENT VIBRATION IN RIGHT HAND DECR IN RUE AND RLE  COORDINATION:  finger-nose-finger, fine finger movements --> DYSMETRIA IN RUE   REFLEXES:  deep tendon reflexes --> 2+ IN BUE AND KNEES; 1+ AT ANKLES; SLIGHTLY INCREASED ON RIGHT SIDE  GAIT/STATION:  MILD RIGHT HEMIPARETIC GAIT     DIAGNOSTIC DATA (LABS, IMAGING, TESTING) - I reviewed patient records, labs, notes, testing and imaging myself where available.  Lab Results  Component Value Date   WBC 4.3 12/11/2023   HGB 14.9 12/11/2023   HCT 45.9 12/11/2023   MCV 97.0 12/11/2023   PLT 273 12/11/2023      Component Value Date/Time   NA 139 12/11/2023 1122   NA 138 07/21/2022 1239   K 3.8 12/11/2023 1122   CL 104 12/11/2023 1122   CO2 23 12/11/2023 1122   GLUCOSE 93 12/11/2023 1122   BUN 13 12/11/2023 1122   BUN 9 07/21/2022 1239   CREATININE 0.52 (L) 12/11/2023 1122   CALCIUM  9.4 12/11/2023 1122   PROT 6.7 02/08/2023 1615   PROT 6.9 07/21/2022 1239   ALBUMIN 4.2 02/08/2023 1615   ALBUMIN 4.5 07/21/2022 1239   AST 45 (H) 02/08/2023 1615   ALT 22 02/08/2023 1615   ALKPHOS 54 02/08/2023 1615   BILITOT 1.8 (H) 02/08/2023 1615   BILITOT 0.9 07/21/2022 1239   GFRNONAA >60 12/11/2023 1122   GFRAA 124 05/16/2020 1725   No results found for: CHOL, HDL, LDLCALC, LDLDIRECT, TRIG, CHOLHDL Lab Results  Component Value Date   HGBA1C 4.6 (L) 05/31/2019   Lab Results  Component Value Date   VITAMINB12 290 04/06/2021   Lab Results  Component Value Date   TSH 0.550 07/21/2022    06/07/20 MRI lumbar spine  -Mild degenerative changes at L4-5 and L5-S1  04/06/21 MRI cervical spine  1. Patchy increased T2 signal mostly  within the  dorsal cervical cord from C3 to C6-C7, but also involving the right and left hemicords at C5-C6. Some but not all of these areas demonstrate mild enhancement. Differential considerations include transverse myelitis, ADEM, and subacute combined degeneration. 2. Multilevel cervical spondylosis as described above. Severe neuroforaminal stenosis on the left at C3-C4 bilaterally at C4-C5.   04/06/21 MRI thoracic spine  1. Normal MRI of the thoracic spine.  12/11/23 MRI brain (with and without) [I reviewed images myself and agree with interpretation. -VRP]  1. No acute intracranial abnormality. 2. Patchy T2/FLAIR hyperintensity involving the supratentorial cerebral white matter, nonspecific, but overall mild to moderate in nature, and mildly progressed as compared to previous MRI from 04/06/2021. Findings are again nonspecific, but favored to reflect sequelae of chronic microvascular ischemic disease. Sequelae of migrainous disorder or prior infectious or inflammatory process would be the primary differential considerations. Possible demyelinating disease is not entirely excluded, but appearance would not be typical for this etiology. No abnormal enhancement.  12/11/23 MRI cervical spine (with and without)  1. Patchy signal abnormality involving the dorsal cord, extending from C3 through C6-7, relatively stable in distribution from prior. The cord appears somewhat diffusely atrophic through this region, with no associated enhancement seen on today's exam. Findings are consistent with a prior insult, with sequelae of prior transverse myelitis, subacute combined degeneration, or other demyelinating process/ADEM the primary considerations. 2. Multilevel cervical spondylosis without significant spinal stenosis. Associated moderate to severe bilateral C4 through C8 foraminal narrowing as above.    ASSESSMENT AND PLAN  53 y.o. year old male here with:  Dx:  1. Demyelinating  disease of central nervous system (HCC)   2. Multiple sclerosis, relapsing-remitting      PLAN:  CNS autoimmune / demyelinating disease (likely relapsing remitting multiple sclerosis) - check labs - repeat lumbar puncture - then consider ocrevus vs tysabri - use cane as needed; consider PT / OT evaluation - continue pregabalin   Orders Placed This Encounter  Procedures   DG FL GUIDED LUMBAR PUNCTURE   HTLV 1+2 antibodies, (EIA), bld   Copper, serum   Ceruloplasmin   Neuromyelitis optica autoab, IgG   Anti-MOG, Serum   Immunoglobulins, QN, A/E/G/M   Varicella zoster antibody, IgG   QuantiFERON-TB Gold Plus   Hep B Surface Antigen   Hepatitis B core antibody, total   Hepatitis B surface antibody,qualitative   Hepatitis C antibody   Stratify JCV(TM) Ab w/Index   QuantiFERON-TB Gold Plus   Return in about 6 weeks (around 09/13/2024) for MyChart visit (15 min).  I reviewed images, labs, notes, records myself. I summarized findings and reviewed with patient, for this high risk condition (multiple sclerosis) requiring high complexity decision making.    EDUARD FABIENE HANLON, MD 08/07/2024, 5:48 PM Certified in Neurology, Neurophysiology and Neuroimaging  [facility] [address], [address] [place], [place] [address] [phone]

## 2024-08-03 LAB — ANTI-MOG, SERUM: MOG Antibody, Cell-based IFA: NEGATIVE

## 2024-08-04 ENCOUNTER — Other Ambulatory Visit: Payer: Self-pay

## 2024-08-04 ENCOUNTER — Ambulatory Visit (INDEPENDENT_AMBULATORY_CARE_PROVIDER_SITE_OTHER): Payer: Medicare (Managed Care)

## 2024-08-04 DIAGNOSIS — Z0000 Encounter for general adult medical examination without abnormal findings: Secondary | ICD-10-CM | POA: Diagnosis not present

## 2024-08-04 DIAGNOSIS — I10 Essential (primary) hypertension: Secondary | ICD-10-CM

## 2024-08-04 LAB — QUANTIFERON-TB GOLD PLUS

## 2024-08-04 MED ORDER — AMLODIPINE BESYLATE 10 MG PO TABS
10.0000 mg | ORAL_TABLET | Freq: Every day | ORAL | 2 refills | Status: AC
Start: 2024-08-04 — End: ?

## 2024-08-04 NOTE — Patient Instructions (Signed)
 Health Maintenance, Male  Adopting a healthy lifestyle and getting preventive care are important in promoting health and wellness. Ask your health care provider about:  The right schedule for you to have regular tests and exams.  Things you can do on your own to prevent diseases and keep yourself healthy.  What should I know about diet, weight, and exercise?  Eat a healthy diet    Eat a diet that includes plenty of vegetables, fruits, low-fat dairy products, and lean protein.  Do not eat a lot of foods that are high in solid fats, added sugars, or sodium.  Maintain a healthy weight  Body mass index (BMI) is a measurement that can be used to identify possible weight problems. It estimates body fat based on height and weight. Your health care provider can help determine your BMI and help you achieve or maintain a healthy weight.  Get regular exercise  Get regular exercise. This is one of the most important things you can do for your health. Most adults should:  Exercise for at least 150 minutes each week. The exercise should increase your heart rate and make you sweat (moderate-intensity exercise).  Do strengthening exercises at least twice a week. This is in addition to the moderate-intensity exercise.  Spend less time sitting. Even light physical activity can be beneficial.  Watch cholesterol and blood lipids  Have your blood tested for lipids and cholesterol at 54 years of age, then have this test every 5 years.  You may need to have your cholesterol levels checked more often if:  Your lipid or cholesterol levels are high.  You are older than 54 years of age.  You are at high risk for heart disease.  What should I know about cancer screening?  Many types of cancers can be detected early and may often be prevented. Depending on your health history and family history, you may need to have cancer screening at various ages. This may include screening for:  Colorectal cancer.  Prostate cancer.  Skin cancer.  Lung  cancer.  What should I know about heart disease, diabetes, and high blood pressure?  Blood pressure and heart disease  High blood pressure causes heart disease and increases the risk of stroke. This is more likely to develop in people who have high blood pressure readings or are overweight.  Talk with your health care provider about your target blood pressure readings.  Have your blood pressure checked:  Every 3-5 years if you are 24-52 years of age.  Every year if you are 3 years old or older.  If you are between the ages of 60 and 72 and are a current or former smoker, ask your health care provider if you should have a one-time screening for abdominal aortic aneurysm (AAA).  Diabetes  Have regular diabetes screenings. This checks your fasting blood sugar level. Have the screening done:  Once every three years after age 66 if you are at a normal weight and have a low risk for diabetes.  More often and at a younger age if you are overweight or have a high risk for diabetes.  What should I know about preventing infection?  Hepatitis B  If you have a higher risk for hepatitis B, you should be screened for this virus. Talk with your health care provider to find out if you are at risk for hepatitis B infection.  Hepatitis C  Blood testing is recommended for:  Everyone born from [birth year] through 1966.  Anyone  with known risk factors for hepatitis C.  Sexually transmitted infections (STIs)  You should be screened each year for STIs, including gonorrhea and chlamydia, if:  You are sexually active and are younger than 53 years of age.  You are older than 54 years of age and your health care provider tells you that you are at risk for this type of infection.  Your sexual activity has changed since you were last screened, and you are at increased risk for chlamydia or gonorrhea. Ask your health care provider if you are at risk.  Ask your health care provider about whether you are at high risk for HIV. Your health care provider  may recommend a prescription medicine to help prevent HIV infection. If you choose to take medicine to prevent HIV, you should first get tested for HIV. You should then be tested every 3 months for as long as you are taking the medicine.  Follow these instructions at home:  Alcohol use  Do not drink alcohol if your health care provider tells you not to drink.  If you drink alcohol:  Limit how much you have to 0-2 drinks a day.  Know how much alcohol is in your drink. In the [place] one drink equals one 12 oz bottle of beer (355 mL), one 5 oz glass of wine (148 mL), or one 1 oz glass of hard liquor (44 mL).  Lifestyle  Do not use any products that contain nicotine or tobacco. These products include cigarettes, chewing tobacco, and vaping devices, such as e-cigarettes. If you need help quitting, ask your health care provider.  Do not use street drugs.  Do not share needles.  Ask your health care provider for help if you need support or information about quitting drugs.  General instructions  Schedule regular health, dental, and eye exams.  Stay current with your vaccines.  Tell your health care provider if:  You often feel depressed.  You have ever been abused or do not feel safe at home.  Summary  Adopting a healthy lifestyle and getting preventive care are important in promoting health and wellness.  Follow your health care provider's instructions about healthy diet, exercising, and getting tested or screened for diseases.  Follow your health care provider's instructions on monitoring your cholesterol and blood pressure.  This information is not intended to replace advice given to you by your health care provider. Make sure you discuss any questions you have with your health care provider.  Document Revised: 02/17/2021 Document Reviewed: 02/17/2021  Elsevier Patient Education  2024 [employer].

## 2024-08-04 NOTE — Progress Notes (Signed)
 Subjective:   Connor Andrews is a 53 y.o. male who presents for Medicare Annual/Subsequent preventive examination.  Visit Complete: Virtual I connected with  Connor Andrews on 08/04/24 by a audio enabled telemedicine application and verified that I am speaking with the correct person using two identifiers.  Patient Location: Home  Provider Location: Office/Clinic  I discussed the limitations of evaluation and management by telemedicine. The patient expressed understanding and agreed to proceed.  Vital Signs: Because this visit was a virtual/telehealth visit, some criteria may be missing or patient reported. Any vitals not documented were not able to be obtained and vitals that have been documented are patient reported.  Patient Medicare AWV questionnaire was completed by the patient on 08/03/24; I have confirmed that all information answered by patient is correct and no changes since this date.  Cardiac Risk Factors include: hypertension     Objective:    Today's Vitals   08/04/24 0901  PainSc: 8    There is no height or weight on file to calculate BMI.     12/11/2023   11:06 AM 08/25/2021    4:44 PM 08/22/2021    5:03 PM 07/07/2021    8:01 AM 04/11/2021    9:26 AM 04/06/2021   12:15 PM 01/05/2020    2:19 AM  Advanced Directives  Does Patient Have a Medical Advance Directive? No No No No  No No  Would patient like information on creating a medical advance directive?  No - Patient declined No - Patient declined  No - Patient declined  Yes (ED - Information included in AVS)    Current Medications (verified) Outpatient Encounter Medications as of 08/04/2024  Medication Sig   pregabalin  (LYRICA ) 75 MG capsule Take 1 capsule (75 mg total) by mouth 2 (two) times daily.   [DISCONTINUED] amLODipine  (NORVASC ) 10 MG tablet Take 1 tablet (10 mg total) by mouth daily.   celecoxib  (CELEBREX ) 50 MG capsule Take 1 capsule (50 mg total) by mouth 2 (two) times daily. (Patient not taking:  Reported on 08/04/2024)   cyanocobalamin  1000 MCG tablet Take 1 tablet (1,000 mcg total) by mouth daily. (Patient not taking: Reported on 08/04/2024)   losartan  (COZAAR ) 25 MG tablet Take 1 tablet (25 mg total) by mouth daily. (Patient not taking: Reported on 08/04/2024)   OLANZapine  (ZYPREXA ) 2.5 MG tablet Take 1 tablet (2.5 mg total) by mouth at bedtime. (Patient not taking: Reported on 08/04/2024)   sertraline  (ZOLOFT ) 50 MG tablet Take 1 tablet (50 mg total) by mouth daily. (Patient not taking: Reported on 08/04/2024)   thiamine  (VITAMIN B1) 100 MG tablet Take 1 tablet (100 mg total) by mouth daily. (Patient not taking: Reported on 08/04/2024)   Vitamin D , Ergocalciferol , (DRISDOL ) 1.25 MG (50000 UNIT) CAPS capsule Take 1 capsule (50,000 Units total) by mouth 2 (two) times a week. (Patient not taking: Reported on 08/04/2024)   No facility-administered encounter medications on file as of 08/04/2024.    Allergies (verified) Patient has no known allergies.   History: Past Medical History:  Diagnosis Date   Arthritis    right hip   Hypertension    Low back pain    right side   No past surgical history on file. Family History  Problem Relation Age of Onset   Hypertension Mother    Diabetes Mother    Hypertension Father    Multiple sclerosis Neg Hx    Social History   Socioeconomic History   Marital status: Married    Spouse  name: Connor Andrews   Number of children: 2   Years of education: Not on file   Highest education level: GED or equivalent  Occupational History    Comment: 07/22/20 out of work d/t symptoms  Tobacco Use   Smoking status: Former    Current packs/day: 0.25    Types: Cigarettes   Smokeless tobacco: Never   Tobacco comments:    07/22/20 1 pk a week     07/07/2021: Patient has been cutting down     10/15/2021 - unable to hold the cigarette so he is cutting back  Vaping Use   Vaping status: Never Used  Substance and Sexual Activity   Alcohol use: Not  Currently    Comment: weekends only   Drug use: No   Sexual activity: Yes    Partners: Female  Other Topics Concern   Not on file  Social History Narrative   Lives with wife   Right handed    Lives in a one story home    Disabled    Social Drivers of Health   Financial Resource Strain: High Risk (08/04/2024)   Overall Financial Resource Strain (CARDIA)    Difficulty of Paying Living Expenses: Very hard  Food Insecurity: Food Insecurity Present (08/04/2024)   Hunger Vital Sign    Worried About Running Out of Food in the Last Year: Often true    Ran Out of Food in the Last Year: Often true  Transportation Needs: No Transportation Needs (08/04/2024)   PRAPARE - Administrator, Civil Service (Medical): No    Lack of Transportation (Non-Medical): No  Physical Activity: Inactive (08/04/2024)   Exercise Vital Sign    Days of Exercise per Week: 0 days    Minutes of Exercise per Session: 0 min  Stress: Stress Concern Present (08/04/2024)   [employer] of Occupational Health - Occupational Stress Questionnaire    Feeling of Stress: Very much  Social Connections: Socially Isolated (08/04/2024)   Social Connection and Isolation Panel    Frequency of Communication with Friends and Family: Once a week    Frequency of Social Gatherings with Friends and Family: Never    Attends Religious Services: Never    Database administrator or Organizations: No    Attends Banker Meetings: Never    Marital Status: Separated    Tobacco Counseling Counseling given: Not Answered Tobacco comments: 07/22/20 1 pk a week  07/07/2021: Patient has been cutting down  10/15/2021 - unable to hold the cigarette so he is cutting back   Clinical Intake:  Pre-visit preparation completed: Yes  Pain : 0-10 Pain Score: 8  Pain Type: Chronic pain Pain Location: Leg Pain Orientation: Right Pain Descriptors / Indicators: Constant, Tingling, Burning, Numbness Pain Onset: More than  a month ago Pain Frequency: Several days a week     Nutritional Risks: None Diabetes: No  How often do you need to have someone help you when you read instructions, pamphlets, or other written materials from your doctor or pharmacy?: 1 - Never What is the last grade level you completed in school?: 12th grade  Interpreter Needed?: No  Information entered by :: Jordy Verba h   Activities of Daily Living    08/04/2024    9:03 AM  In your present state of health, do you have any difficulty performing the following activities:  Hearing? 0  Vision? 0  Difficulty concentrating or making decisions? 0  Walking or climbing stairs? 0  Dressing or bathing?  0  Doing errands, shopping? 0  Preparing Food and eating ? Y  Using the Toilet? N  In the past six months, have you accidently leaked urine? Y  Do you have problems with loss of bowel control? N  Managing your Medications? Y  Managing your Finances? N  Housekeeping or managing your Housekeeping? Y    Patient Care Team: Georgina Speaks, FNP as PCP - General (General Practice) Patel, Donika K, DO as Consulting Physician (Neurology) Morgan, Clayborne CROME, RN as [facility] Care Management  Indicate any recent Medical Services you may have received from other than [facility] providers in the past year (date may be approximate).     Assessment:   This is a routine wellness examination for Jarion.  Hearing/Vision screen No results found.   Goals Addressed   None    Depression Screen    08/04/2024    9:07 AM 07/25/2024   12:34 PM 07/21/2022   10:07 AM 01/06/2022    1:07 PM 10/15/2021    8:24 AM 04/17/2021    9:15 AM 07/24/2020    8:54 AM  PHQ 2/9 Scores  PHQ - 2 Score 2 2 6  6 6  0  PHQ- 9 Score 8 16 27  25 15       Information is confidential and restricted. Go to Review Flowsheets to unlock data.    Fall Risk    08/04/2024    9:08 AM 07/25/2024   12:08 PM 07/21/2022   10:07 AM 07/07/2021    8:01 AM 05/31/2019    2:26  PM  Fall Risk   Falls in the past year? 1 0 0 1   Number falls in past yr: 0 0 0 1 1   Comment     has been falling weekly   Injury with Fall? 0 0 0 1   Risk for fall due to : History of fall(s) No Fall Risks No Fall Risks    Follow up Falls evaluation completed Falls evaluation completed Falls evaluation completed        Data saved with a previous flowsheet row definition    MEDICARE RISK AT HOME: Medicare Risk at Home Any stairs in or around the home?: No If so, are there any without handrails?: No Home free of loose throw rugs in walkways, pet beds, electrical cords, etc?: Yes Adequate lighting in your home to reduce risk of falls?: Yes Life alert?: No Use of a cane, walker or w/c?: Yes Grab bars in the bathroom?: No Shower chair or bench in shower?: No Elevated toilet seat or a handicapped toilet?: No  Cognitive Function:        08/04/2024    9:09 AM  6CIT Screen  What Year? 0 points  What month? 0 points  What time? 0 points  Count back from 20 0 points  Months in reverse 2 points  Repeat phrase 6 points  Total Score 8 points    Immunizations Immunization History  Administered Date(s) Administered   Moderna Sars-Covid-2 Vaccination 05/17/2020, 06/14/2020    TDAP status: Due, Education has been provided regarding the importance of this vaccine. Advised may receive this vaccine at local pharmacy or Health Dept. Aware to provide a copy of the vaccination record if obtained from local pharmacy or Health Dept. Verbalized acceptance and understanding.  Flu Vaccine status: Due, Education has been provided regarding the importance of this vaccine. Advised may receive this vaccine at local pharmacy or Health Dept. Aware to provide a copy of  the vaccination record if obtained from local pharmacy or Health Dept. Verbalized acceptance and understanding.  Pneumococcal vaccine status: Due, Education has been provided regarding the importance of this vaccine. Advised may  receive this vaccine at local pharmacy or Health Dept. Aware to provide a copy of the vaccination record if obtained from local pharmacy or Health Dept. Verbalized acceptance and understanding.  Covid-19 vaccine status: Completed vaccines  Qualifies for Shingles Vaccine? No   Zostavax completed No   Shingrix Completed?: No.    Education has been provided regarding the importance of this vaccine. Patient has been advised to call insurance company to determine out of pocket expense if they have not yet received this vaccine. Advised may also receive vaccine at local pharmacy or Health Dept. Verbalized acceptance and understanding.  Screening Tests Health Maintenance  Topic Date Due   Hepatitis B Vaccines 19-59 Average Risk (1 of 3 - 19+ 3-dose series) Never done   COVID-19 Vaccine (3 - Moderna risk series) 08/10/2024 (Originally 07/12/2020)   Zoster Vaccines- Shingrix (1 of 2) 10/25/2024 (Originally [birth year]04/1988)   Influenza Vaccine  01/09/2025 (Originally 05/12/2024)   DTaP/Tdap/Td (1 - Tdap) 07/25/2025 (Originally 12/04/1989)   Pneumococcal Vaccine: 50+ Years (1 of 1 - PCV) 07/25/2025 (Originally 12/05/2019)   Medicare Annual Wellness (AWV)  08/04/2025   Colonoscopy  05/09/2031   Hepatitis C Screening  Completed   HIV Screening  Completed   HPV VACCINES  Aged Out   Meningococcal B Vaccine  Aged Out    Health Maintenance  Health Maintenance Due  Topic Date Due   Hepatitis B Vaccines 19-59 Average Risk (1 of 3 - 19+ 3-dose series) Never done    Colorectal cancer screening: Type of screening: Colonoscopy. Completed 05/08/2021. Repeat every 10 years  Lung Cancer Screening: (Low Dose CT Chest recommended if Age 65-80 years, 20 pack-year currently smoking OR have quit w/in 15years.) does qualify.   Lung Cancer Screening Referral: n/a  Additional Screening:  Hepatitis C Screening: does qualify; Completed 08/02/24  Vision Screening: Recommended annual ophthalmology exams for early detection  of glaucoma and other disorders of the eye. Is the patient up to date with their annual eye exam?  No  Who is the provider or what is the name of the office in which the patient attends annual eye exams? N/a If pt is not established with a provider, would they like to be referred to a provider to establish care? No .   Dental Screening: Recommended annual dental exams for proper oral hygiene   Community Resource Referral / Chronic Care Management: CRR required this visit?  No   CCM required this visit?  No     Plan:     I have personally reviewed and noted the following in the patient's chart:   Medical and social history Use of alcohol, tobacco or illicit drugs  Current medications and supplements including opioid prescriptions. Patient is not currently taking opioid prescriptions. Functional ability and status Nutritional status Physical activity Advanced directives List of other physicians Hospitalizations, surgeries, and ER visits in previous 12 months Vitals Screenings to include cognitive, depression, and falls Referrals and appointments  In addition, I have reviewed and discussed with patient certain preventive protocols, quality metrics, and best practice recommendations. A written personalized care plan for preventive services as well as general preventive health recommendations were provided to patient.     Richerd ONEIDA Lemmings, CMA   08/04/2024   After Visit Summary: (MyChart) Due to this being a telephonic visit,  the after visit summary with patients personalized plan was offered to patient via MyChart   Nurse Notes: Richerd Lemmings, CMA

## 2024-08-05 LAB — QUANTIFERON-TB GOLD PLUS
QuantiFERON Mitogen Value: >10 [IU]/mL
QuantiFERON Nil Value: 0.05 [IU]/mL
QuantiFERON TB1 Ag Value: 0.05 [IU]/mL
QuantiFERON TB2 Ag Value: 0.05 [IU]/mL
QuantiFERON-TB Gold Plus: NEGATIVE

## 2024-08-05 LAB — HTLV I+II ANTIBODIES, (EIA), BLD: HTLV I/II Ab: NEGATIVE

## 2024-08-05 LAB — HEPATITIS C ANTIBODY: Hep C Virus Ab: NONREACTIVE

## 2024-08-05 LAB — VARICELLA ZOSTER ANTIBODY, IGG: Varicella zoster IgG: REACTIVE

## 2024-08-05 LAB — NEUROMYELITIS OPTICA AUTOAB, IGG: NMO IgG Autoantibodies: <1.5 U/mL (ref 0.0–3.0)

## 2024-08-05 LAB — IMMUNOGLOBULINS A/E/G/M, SERUM
IgA/Immunoglobulin A, Serum: 198 mg/dL (ref 90–386)
IgE (Immunoglobulin E), Serum: 12 [IU]/mL (ref 6–495)
IgG (Immunoglobin G), Serum: 1176 mg/dL (ref 603–1613)
IgM (Immunoglobulin M), Srm: 340 mg/dL — ABNORMAL HIGH (ref 20–172)

## 2024-08-05 LAB — HEPATITIS B CORE ANTIBODY, TOTAL: Hep B Core Total Ab: NEGATIVE

## 2024-08-05 LAB — CERULOPLASMIN: Ceruloplasmin: 25.3 mg/dL (ref 16.0–31.0)

## 2024-08-05 LAB — HEPATITIS B SURFACE ANTIGEN: Hepatitis B Surface Ag: NEGATIVE

## 2024-08-05 LAB — COPPER, SERUM: Copper: 102 ug/dL (ref 69–132)

## 2024-08-09 ENCOUNTER — Telehealth: Payer: Self-pay | Admitting: Diagnostic Neuroimaging

## 2024-08-09 NOTE — Telephone Encounter (Signed)
 Pt Called about upcoming LP, phone rep provided # to GI

## 2024-08-23 NOTE — Discharge Instructions (Signed)
 Lumbar Puncture Discharge Instructions  Go home and rest quietly as needed. You may resume normal activities; however, do not exert yourself strongly or do any heavy lifting today and tomorrow.   DO NOT drive today.    You may resume your normal diet and medications unless otherwise indicated. Drink lots of extra fluids today and tomorrow.   The incidence of headache, nausea, or vomiting is about 5% (one in 20 patients).  If you develop a headache, lie flat for 24 hours and drink plenty of fluids until the headache goes away.  Caffeinated beverages may be helpful. If when you get up you still have a headache when standing, go back to bed and force fluids for another 24 hours.   If you develop severe nausea and vomiting or a headache that does not go away with the flat bedrest after 48 hours, please call [phone].   Call your physician for a follow-up appointment.  The results of your Lumbar Puncture will be sent directly to your physician and they will contact you.   If you have any questions or if complications develop after you arrive home, please call [phone].  Discharge instructions have been explained to the patient.  The patient, or the person responsible for the patient, fully understands these instructions.   Thank you for visiting our office today. Lumbar Puncture Discharge Instructions  Go home and rest quietly as needed. You may resume normal activities; however, do not exert yourself strongly or do any heavy lifting today and tomorrow.   DO NOT drive today.    You may resume your normal diet and medications unless otherwise indicated. Drink lots of extra fluids today and tomorrow.   The incidence of headache, nausea, or vomiting is about 5% (one in 20 patients).  If you develop a headache, lie flat for 24 hours and drink plenty of fluids until the headache goes away.  Caffeinated beverages may be helpful. If when you get up you still have a headache when standing, go  back to bed and force fluids for another 24 hours.   If you develop severe nausea and vomiting or a headache that does not go away with the flat bedrest after 48 hours, please call [phone].   Call your physician for a follow-up appointment.  The results of your Lumbar Puncture will be sent directly to your physician and they will contact you.   If you have any questions or if complications develop after you arrive home, please call [phone].  Discharge instructions have been explained to the patient.  The patient, or the person responsible for the patient, fully understands these instructions.   Thank you for visiting our office today.

## 2024-08-24 ENCOUNTER — Ambulatory Visit
Admission: RE | Admit: 2024-08-24 | Discharge: 2024-08-24 | Disposition: A | Payer: Medicare (Managed Care) | Source: Ambulatory Visit | Attending: Diagnostic Neuroimaging | Admitting: Diagnostic Neuroimaging

## 2024-08-24 ENCOUNTER — Other Ambulatory Visit (HOSPITAL_COMMUNITY)
Admission: RE | Admit: 2024-08-24 | Discharge: 2024-08-24 | Disposition: A | Payer: Medicare (Managed Care) | Source: Ambulatory Visit | Attending: Diagnostic Neuroimaging | Admitting: Diagnostic Neuroimaging

## 2024-08-24 VITALS — BP 143/92 | HR 104 | Resp 19

## 2024-08-24 DIAGNOSIS — G35D Multiple sclerosis, unspecified: Secondary | ICD-10-CM

## 2024-08-24 DIAGNOSIS — R634 Abnormal weight loss: Secondary | ICD-10-CM

## 2024-08-24 DIAGNOSIS — G379 Demyelinating disease of central nervous system, unspecified: Secondary | ICD-10-CM

## 2024-08-24 DIAGNOSIS — R531 Weakness: Secondary | ICD-10-CM | POA: Diagnosis not present

## 2024-08-24 DIAGNOSIS — R839 Unspecified abnormal finding in cerebrospinal fluid: Secondary | ICD-10-CM

## 2024-08-25 LAB — CYTOLOGY - NON PAP

## 2024-08-29 LAB — CSF CELL COUNT WITH DIFFERENTIAL
RBC Count, CSF: 0 {cells}/uL
TOTAL NUCLEATED CELL: 2 {cells}/uL (ref 0–5)

## 2024-08-29 LAB — CSF CULTURE W GRAM STAIN
MICRO NUMBER:: 17231006
Result:: NO GROWTH
SPECIMEN QUALITY:: ADEQUATE

## 2024-08-29 LAB — CNS IGG SYNTHESIS RATE, CSF+BLOOD
Albumin Serum: 4.8 g/dL (ref 3.6–5.1)
Albumin, CSF: 24.6 mg/dL (ref 8.0–42.0)
CNS-IgG Synthesis Rate: -3.2 mg/(24.h) (ref <=3.3)
IgG (Immunoglobin G), Serum: 1030 mg/dL (ref 600–1640)
IgG Total CSF: 2.5 mg/dL (ref 0.8–7.7)
IgG-Index: 0.47 (ref <0.70)

## 2024-08-29 LAB — OLIGOCLONAL BANDS, CSF + SERM: Oligo Bands: ABSENT

## 2024-08-29 LAB — PROTEIN, CSF: Total Protein, CSF: 41 mg/dL (ref 15–45)

## 2024-08-29 LAB — GLUCOSE, CSF: Glucose, CSF: 54 mg/dL (ref 40–80)

## 2024-09-01 ENCOUNTER — Emergency Department (HOSPITAL_BASED_OUTPATIENT_CLINIC_OR_DEPARTMENT_OTHER)
Admission: EM | Admit: 2024-09-01 | Discharge: 2024-09-01 | Disposition: A | Discharge status: Home / Self Care | Payer: Medicare (Managed Care) | Attending: Emergency Medicine | Admitting: Emergency Medicine

## 2024-09-01 ENCOUNTER — Emergency Department (HOSPITAL_BASED_OUTPATIENT_CLINIC_OR_DEPARTMENT_OTHER): Payer: Medicare (Managed Care) | Admitting: Radiology

## 2024-09-01 ENCOUNTER — Encounter (HOSPITAL_BASED_OUTPATIENT_CLINIC_OR_DEPARTMENT_OTHER): Payer: Self-pay

## 2024-09-01 ENCOUNTER — Other Ambulatory Visit: Payer: Self-pay

## 2024-09-01 DIAGNOSIS — K5900 Constipation, unspecified: Secondary | ICD-10-CM | POA: Insufficient documentation

## 2024-09-01 MED ORDER — MAGNESIUM CITRATE PO SOLN: 1.0000 | Freq: Once | ORAL | 0 refills | Status: AC

## 2024-09-01 MED ORDER — POLYETHYLENE GLYCOL 3350 17 GM/SCOOP PO POWD
17.0000 g | Freq: Every day | ORAL | 0 refills | Status: AC
Start: 2024-09-01 — End: ?

## 2024-09-01 NOTE — ED Notes (Signed)
 Discharge instructions and follow up care reviewed and explained, pt verbalized understanding and had no further questions on d/c. Pt caox4 ambulatory NAD on d/c.

## 2024-09-01 NOTE — ED Provider Notes (Signed)
 [place] EMERGENCY DEPARTMENT AT [facility] Provider Note   CSN: [id number] Arrival date & time: 09/01/24  1322     Patient presents with: Constipation   Connor Andrews is a 53 y.o. male.  With a history of MS and constipation who presents to the ED for constipation.  Patient recently underwent lumbar puncture on November 13.  Since that time he has been dealing with constipation.  Was previously taking chewable laxatives which seem to help.  Last bowel movement was 3 days ago and was normal.  No longer taking the chewable laxatives.  Having generalized abdominal discomfort rectal pain and continued constipation.  No nausea vomiting fevers chills.    Constipation      Prior to Admission medications   Medication Sig Start Date End Date Taking? Authorizing Provider  magnesium  citrate SOLN Take 296 mLs (1 Bottle total) by mouth once for 1 dose. 09/01/24 09/01/24 Yes Pamella Sharper A, DO  polyethylene glycol powder (GLYCOLAX /MIRALAX ) 17 GM/SCOOP powder Take 17 g by mouth daily. Dissolve 1 capful (17g) in 4-8 ounces of liquid and take by mouth daily. 09/01/24  Yes Pamella Sharper LABOR, DO  amLODipine  (NORVASC ) 10 MG tablet Take 1 tablet (10 mg total) by mouth daily. 08/04/24   Georgina Speaks, FNP  pregabalin  (LYRICA ) 75 MG capsule Take 1 capsule (75 mg total) by mouth 2 (two) times daily. 10/15/21   Georgina Speaks, FNP    Allergies: Patient has no known allergies.    Review of Systems  Gastrointestinal:  Positive for constipation.    Updated Vital Signs BP (!) 130/98 (BP Location: Right Arm)   Pulse (!) 114   Temp 98.2 F (36.8 C)   Resp 17   Ht 5' 10 (1.778 m)   Wt 61.1 kg   SpO2 96%   BMI 19.33 kg/m   Physical Exam Vitals and nursing note reviewed.  HENT:     Head: Normocephalic and atraumatic.  Eyes:     Pupils: Pupils are equal, round, and reactive to light.  Cardiovascular:     Rate and Rhythm: Normal rate and regular rhythm.  Pulmonary:     Effort:  Pulmonary effort is normal.     Breath sounds: Normal breath sounds.  Abdominal:     Palpations: Abdomen is soft.     Tenderness: There is no abdominal tenderness. There is no guarding or rebound.  Genitourinary:    Rectum: Normal.     Comments: No hemorrhoids Skin:    General: Skin is warm and dry.  Neurological:     Mental Status: He is alert.  Psychiatric:        Mood and Affect: Mood normal.     (all labs ordered are listed, but only abnormal results are displayed) Labs Reviewed - No data to display  EKG: None  Radiology: DG Abdomen 1 View Result Date: 09/01/2024 EXAM: 1 VIEW XRAY OF THE ABDOMEN 09/01/2024 04:12:46 PM COMPARISON: None available. CLINICAL HISTORY: const const FINDINGS: BOWEL: Nonobstructive bowel gas pattern. Mild amount of stool is seen throughout the colon. SOFT TISSUES: No opaque urinary calculi. BONES: No acute osseous abnormality. IMPRESSION: 1. Mild colonic stool burden. Electronically signed by: Lynwood Seip MD 09/01/2024 04:18 PM EST RP Workstation: [id number]     Procedures   Medications Ordered in the ED - No data to display  Medical Decision Making 53 year old male with history as above presenting for constipation.  Based on exam and description and abdominal x-ray this is mild constipation.  X-ray shows mild stool burden.  Likely some component of neurogenic constipation considering history of MS.  Will instruct to increase bowel regimen with p.o. laxatives at home and follow-up with his PCP.  Low suspicion for acute obstructive process at this time  Amount and/or Complexity of Data Reviewed Radiology: ordered.  Risk OTC drugs.        Final diagnoses:  Constipation, unspecified constipation type    ED Discharge Orders          Ordered    polyethylene glycol powder (GLYCOLAX /MIRALAX ) 17 GM/SCOOP powder  Daily        09/01/24 1636    magnesium  citrate SOLN   Once        09/01/24 1636                Pamella Ozell LABOR, DO 09/01/24 1638

## 2024-09-01 NOTE — Discharge Instructions (Signed)
 You were seen in the emergency room for constipation The x-ray showed mild constipation You need to increase your laxative intake at home Start with taking 1 cap of MiraLAX  which we have prescribed to your pharmacy If you do not have a bowel movement you can increase to 1.5 caps or 2 caps Ideally you should achieve 1-2 soft bowel movements daily If the MiraLAX  is not working try drinking half of the bottle of magnesium  citrate which we have prescribed If you are still having constipation or severe pain after using these methods at home return to the emergency department Otherwise follow-up with your primary doctor within 1 week for reevaluation

## 2024-09-01 NOTE — ED Triage Notes (Signed)
 Arrives POV with complaints of worsening constipation for 4 days. Patient had recent Lumbar Puncture (ruling out multiple sclerosis). Patient rates his abdomen/rectal pain a 9/10.

## 2024-09-08 ENCOUNTER — Ambulatory Visit: Payer: Self-pay | Admitting: Diagnostic Neuroimaging

## 2024-09-22 ENCOUNTER — Ambulatory Visit: Payer: Self-pay | Admitting: Diagnostic Neuroimaging

## 2024-10-19 NOTE — Telephone Encounter (Signed)
 I scheduled patient for 11/16/23 at 3pm due to concerns. Per last note patient was supposed to return in December for a 6 week follow-up

## 2024-10-20 ENCOUNTER — Ambulatory Visit: Payer: Medicare (Managed Care)

## 2024-10-20 VITALS — BP 128/80 | HR 68 | Temp 98.2°F | Ht 70.0 in | Wt 134.0 lb

## 2024-10-20 DIAGNOSIS — L282 Other prurigo: Secondary | ICD-10-CM

## 2024-10-20 DIAGNOSIS — K4090 Unilateral inguinal hernia, without obstruction or gangrene, not specified as recurrent: Secondary | ICD-10-CM

## 2024-10-20 DIAGNOSIS — L853 Xerosis cutis: Secondary | ICD-10-CM

## 2024-10-20 DIAGNOSIS — R1032 Left lower quadrant pain: Secondary | ICD-10-CM

## 2024-10-20 NOTE — Progress Notes (Addendum)
 "  Acute Office Visit  Subjective:     Patient ID: Connor Andrews, male    DOB: 25-Sep-1971, 54 y.o.   MRN: [id number]  Chief Complaint  Patient presents with   Inguinal Hernia   Rash    Back and groin area   This is a 54 year old African-American male who is here for 2 major problems; he has a pruritic rash over the left lower abdominal wall that has been present for 1-2 months.  He also has a pruritic area over his right lower back for the past 1-2 months that is generalized.  He has been having pain over the left groin area for the past 1-2 months that is a dull aching pain that occurs both day/night.  He denies any trauma to the left groin area.  He used to work as a naval architect.  He states that nothing makes his pain better and taken a hot shower improves the pain.  He does have a bulge in the area over the superior left groin area.  He does have a history of MULTIPLE SCLEROSIS and is seeing a neurologist.  He missed his appointment with the neurologist in December, 2025 and has a next appointment on 2//26.  He was diagnosed with MS in [place]  in October, 2025.    Review of Systems  Gastrointestinal:  Positive for abdominal pain.  Musculoskeletal:        L groin pain 1-2 mths.  Skin:  Positive for itching and rash.        Objective:    BP 128/80 (BP Location: Left Arm, Patient Position: Sitting, Cuff Size: Large)   Pulse 68   Temp 98.2 F (36.8 C) (Oral)   Ht 5' 10 (1.778 m)   Wt 134 lb (60.8 kg)   SpO2 98%   BMI 19.23 kg/m    Physical Exam Vitals reviewed.  Constitutional:      Appearance: Normal appearance.  Abdominal:     Palpations: Abdomen is soft. There is mass.     Tenderness: There is abdominal tenderness.     Hernia: A hernia is present.     Comments: Noted 3 cm in diameter bulge over the superior left inguinal region with inspection and palpation.  There was tenderness with palpation over this area.  There is no tenderness or bulge in the area over the  right inguinal area with palpation.  Skin:    Findings: Rash present.     Comments: The presence of a 8 cm hyperpigmented area over the left lower quadrant that was scaly in appearance with inspection.  The presence of scaly skin over the the right central lower back with inspection.  Neurological:     Mental Status: He is alert.     No results found for any visits on 10/20/24.      Assessment & Plan:  .  Assessment & Plan Inguinal pain, left      This patient was referred to a general surgeon on 10/20/2024. Orders:   Ambulatory referral to General Surgery  Inguinal hernia of left side without obstruction or gangrene  This patient was referred to a general surgeon on 10/20/2024.  He was advised on 10/20/2024    to avoid any heavy lifting.  He voiced understanding. Orders:   Ambulatory referral to General Surgery  Pruritic rash I advised this patient that 10/20/2024 to apply any over-the-counter lotion that he can afford after taking a shower to the lower left abdominal region and over the central  back.  He voiced understanding.    Dry skin dermatitis      Return After he sees the general surgeon.  [place]ither JONELLE Fischer, DO  "

## 2024-11-15 ENCOUNTER — Telehealth: Payer: Self-pay | Admitting: Diagnostic Neuroimaging

## 2024-11-15 ENCOUNTER — Ambulatory Visit: Payer: Medicare (Managed Care) | Admitting: Diagnostic Neuroimaging

## 2024-11-15 ENCOUNTER — Encounter: Payer: Self-pay | Admitting: Diagnostic Neuroimaging

## 2024-11-15 VITALS — BP 142/100 | HR 100 | Ht 70.0 in | Wt 135.2 lb

## 2024-11-15 DIAGNOSIS — R200 Anesthesia of skin: Secondary | ICD-10-CM | POA: Diagnosis not present

## 2024-11-15 DIAGNOSIS — R202 Paresthesia of skin: Secondary | ICD-10-CM

## 2024-11-15 DIAGNOSIS — G373 Acute transverse myelitis in demyelinating disease of central nervous system: Secondary | ICD-10-CM

## 2024-11-15 MED ORDER — PREGABALIN 75 MG PO CAPS: 75.0000 mg | ORAL_CAPSULE | Freq: Two times a day (BID) | ORAL | 5 refills | Status: AC

## 2024-11-15 MED ORDER — CYCLOBENZAPRINE HCL 5 MG PO TABS: 5.0000 mg | ORAL_TABLET | Freq: Three times a day (TID) | ORAL | 1 refills | Status: AC | PRN

## 2024-11-15 NOTE — Progress Notes (Signed)
 "  [facility]  PATIENT: Connor Andrews DOB: 1971/05/05  REFERRING CLINICIAN: Georgina Speaks, FNP HISTORY FROM: patient  REASON FOR VISIT: follow up   HISTORICAL  CHIEF COMPLAINT:  Chief Complaint  Patient presents with   RM 6     Patient is here alone for MS and lab follow-up - symptoms are getting worse. Back/ shoulder blades pain, numbness/ tinging in both hands and arms worse on the left. Patient received a lumbar puncture and is here to go over results. Was diagnosed with MS in [place]  and moved to [place] and was told he does not have MS would like to discuss treatment options     HISTORY OF PRESENT ILLNESS:   UPDATE (11/15/24, VRP): Since last visit, doing about the same. Symptoms are persistent. Severity is moderate. Continues with right arm / leg pain, gait diff.   UPDATE (08/02/24, VRP): Since last visit, patient was lost to follow-up here.  He went to the hospital in June 2022 due to ongoing right-sided weakness.  He had MRI of the brain and cervical spine and was found to have abnormal lesions suspicious for acute on chronic autoimmune, inflammatory or demyelinating disease.  He was treated empirically with IV steroids.  He followed up with a different neurologist in 2022 (Dr. Tobie).  Patient was referred to another neurologist in [place], and was referred to another neuro immunologist but patient never followed up.  Ultimately he moved to [place]  where he continued to get some neurologic evaluation and treatment.  He saw 2 more neurologist there and eventually was diagnosed with probable multiple sclerosis and was getting ready to start on Ocrevus therapy.  However before he could start this therapy he moved back to [place] to live with his parents.  Patient now living here and trying to get reestablished to have definitive diagnosis and treatment. Patient have ongoing issues with right greater than left sided weakness, pain issues, balance walking  issues.  PRIOR HPI (07/22/20, VRP): 54 year old male here for evaluation of low back pain.  Patient has had 1 year of neck pain, low back pain, right upper extremity and right lower extremity numbness, pain and weakness.  His right hand is clenched and having weakness.  He has numbness in his right hand.  He is dropping things.  He is not able to control his right hand.  Patient had MRI of the lumbar spine which showed some degenerative changes at L4-5 and L5-S1 without significant neural foraminal stenosis.   REVIEW OF SYSTEMS: Full 14 system review of systems performed and negative with exception of: as per HPI.    ALLERGIES: No Known Allergies  HOME MEDICATIONS: Outpatient Medications Prior to Visit  Medication Sig Dispense Refill   amLODipine  (NORVASC ) 10 MG tablet Take 1 tablet (10 mg total) by mouth daily. 90 tablet 2   polyethylene glycol powder (GLYCOLAX /MIRALAX ) 17 GM/SCOOP powder Take 17 g by mouth daily. Dissolve 1 capful (17g) in 4-8 ounces of liquid and take by mouth daily. 238 g 0   pregabalin  (LYRICA ) 75 MG capsule Take 1 capsule (75 mg total) by mouth 2 (two) times daily. 60 capsule 5   No facility-administered medications prior to visit.    PAST MEDICAL HISTORY: Past Medical History:  Diagnosis Date   Arthritis    right hip   Hypertension    Low back pain    right side    PAST SURGICAL HISTORY: History reviewed. No pertinent surgical history.  FAMILY HISTORY: Family History  Problem  Relation Age of Onset   Hypertension Mother    Diabetes Mother    Hypertension Father    Multiple sclerosis Neg Hx    Migraines Neg Hx    Seizures Neg Hx    Stroke Neg Hx    Sleep apnea Neg Hx     SOCIAL HISTORY: Social History   Socioeconomic History   Marital status: Married    Spouse name: Edsel   Number of children: 2   Years of education: Not on file   Highest education level: GED or equivalent  Occupational History    Comment: 07/22/20 out of work d/t  symptoms  Tobacco Use   Smoking status: Former    Current packs/day: 0.25    Types: Cigarettes   Smokeless tobacco: Never   Tobacco comments:    07/22/20 1 pk a week     07/07/2021: Patient has been cutting down     10/15/2021 - unable to hold the cigarette so he is cutting back  Vaping Use   Vaping status: Never Used  Substance and Sexual Activity   Alcohol use: Not Currently    Comment: weekends only   Drug use: No   Sexual activity: Yes    Partners: Female  Other Topics Concern   Not on file  Social History Narrative   Lives with wife   Right handed    Lives in a one story home    Disabled    Caffeine - has a cup every 2 days of coffee    Social Drivers of Health   Tobacco Use: Medium Risk (11/15/2024)   Patient History    Smoking Tobacco Use: Former    Smokeless Tobacco Use: Never    Passive Exposure: Not on file  Financial Resource Strain: High Risk (08/04/2024)   Overall Financial Resource Strain (CARDIA)    Difficulty of Paying Living Expenses: Very hard  Food Insecurity: Food Insecurity Present (08/04/2024)   Epic    Worried About Programme Researcher, Broadcasting/film/video in the Last Year: Often true    Ran Out of Food in the Last Year: Often true  Transportation Needs: No Transportation Needs (08/04/2024)   Epic    Lack of Transportation (Medical): No    Lack of Transportation (Non-Medical): No  Physical Activity: Inactive (08/04/2024)   Exercise Vital Sign    Days of Exercise per Week: 0 days    Minutes of Exercise per Session: 0 min  Stress: Stress Concern Present (08/04/2024)   [employer] of Occupational Health - Occupational Stress Questionnaire    Feeling of Stress: Very much  Social Connections: Socially Isolated (08/04/2024)   Social Connection and Isolation Panel    Frequency of Communication with Friends and Family: Once a week    Frequency of Social Gatherings with Friends and Family: Never    Attends Religious Services: Never    Database Administrator or  Organizations: No    Attends Banker Meetings: Never    Marital Status: Separated  Intimate Partner Violence: Not At Risk (08/04/2024)   Epic    Fear of Current or Ex-Partner: No    Emotionally Abused: No    Physically Abused: No    Sexually Abused: No  Depression (PHQ2-9): Medium Risk (08/04/2024)   Depression (PHQ2-9)    PHQ-2 Score: 8  Alcohol Screen: Low Risk (08/04/2024)   Alcohol Screen    Last Alcohol Screening Score (AUDIT): 0  Housing: Unknown (10/26/2024)   Received from [facility]  Epic    Unable to Pay for Housing in the Last Year: Not on file    Number of Times Moved in the Last Year: Not on file    At any time in the past 12 months, were you homeless or living in a shelter (including now)?: No  Recent Concern: Housing - High Risk (08/04/2024)   Epic    Unable to Pay for Housing in the Last Year: Yes    Number of Times Moved in the Last Year: Not on file    Homeless in the Last Year: No  Utilities: Not At Risk (08/04/2024)   Epic    Threatened with loss of utilities: No  Health Literacy: Inadequate Health Literacy (08/04/2024)   B1300 Health Literacy    Frequency of need for help with medical instructions: Often     PHYSICAL EXAM  GENERAL EXAM/CONSTITUTIONAL: Vitals:  Vitals:   11/15/24 1448 11/15/24 1451  BP: (!) 144/94 (!) 142/100  Pulse: (!) 101 100  Weight: 135 lb 3.2 oz (61.3 kg)   Height: 5' 10 (1.778 m)    Body mass index is 19.4 kg/m. Wt Readings from Last 3 Encounters:  11/15/24 135 lb 3.2 oz (61.3 kg)  10/20/24 134 lb (60.8 kg)  09/01/24 134 lb 11.2 oz (61.1 kg)   Patient is in no distress; well developed, nourished and groomed; neck is supple  CARDIOVASCULAR: Examination of carotid arteries is normal; no carotid bruits Regular rate and rhythm, no murmurs Examination of peripheral vascular system by observation and palpation is normal  EYES: Ophthalmoscopic exam of optic discs and posterior segments  is normal; no papilledema or hemorrhages No results found.  MUSCULOSKELETAL: Gait, strength, tone, movements noted in Neurologic exam below  NEUROLOGIC: MENTAL STATUS:      No data to display         awake, alert, oriented to person, place and time recent and remote memory intact normal attention and concentration language fluent, comprehension intact, naming intact fund of knowledge appropriate  CRANIAL NERVE:  2nd - no papilledema on fundoscopic exam 2nd, 3rd, 4th, 6th - pupils equal and reactive to light, visual fields full to confrontation, extraocular muscles intact, no nystagmus 5th - facial sensation symmetric 7th - facial strength symmetric 8th - hearing intact 9th - palate elevates symmetrically, uvula midline 11th - shoulder shrug symmetric 12th - tongue protrusion midline  MOTOR:  INCREASED TONE IN RUE, RLE RUE: DELTOID 4, TRICEPS 3, BICEPS 4, GRIP 2-3 LUE: DELTOID 4+, TRICEPS 4, BICEPS 4, GRIP 3+ RLE: HF 4-, KE / KF 4+, DF 3 LLE: HF 4, KE / KF 4, DF 4  SENSORY:  ABSENT VIBRATION IN RIGHT HAND DECR IN RUE AND RLE  COORDINATION:  finger-nose-finger, fine finger movements --> DYSMETRIA IN RUE   REFLEXES:  deep tendon reflexes --> 1+ IN BUE AND KNEES; 1+ AT ANKLES  GAIT/STATION:  RIGHT HEMIPARETIC GAIT     DIAGNOSTIC DATA (LABS, IMAGING, TESTING) - I reviewed patient records, labs, notes, testing and imaging myself where available.  Lab Results  Component Value Date   WBC 4.3 12/11/2023   HGB 14.9 12/11/2023   HCT 45.9 12/11/2023   MCV 97.0 12/11/2023   PLT 273 12/11/2023      Component Value Date/Time   NA 139 12/11/2023 1122   NA 138 07/21/2022 1239   K 3.8 12/11/2023 1122   CL 104 12/11/2023 1122   CO2 23 12/11/2023 1122   GLUCOSE 93 12/11/2023 1122   BUN 13 12/11/2023  1122   BUN 9 07/21/2022 1239   CREATININE 0.52 (L) 12/11/2023 1122   CALCIUM  9.4 12/11/2023 1122   PROT 6.7 02/08/2023 1615   PROT 6.9 07/21/2022 1239   ALBUMIN  4.8 08/24/2024 1310   AST 45 (H) 02/08/2023 1615   ALT 22 02/08/2023 1615   ALKPHOS 54 02/08/2023 1615   BILITOT 1.8 (H) 02/08/2023 1615   BILITOT 0.9 07/21/2022 1239   GFRNONAA >60 12/11/2023 1122   GFRAA 124 05/16/2020 1725   No results found for: CHOL, HDL, LDLCALC, LDLDIRECT, TRIG, CHOLHDL Lab Results  Component Value Date   HGBA1C 4.6 (L) 05/31/2019   Lab Results  Component Value Date   VITAMINB12 290 04/06/2021   Lab Results  Component Value Date   TSH 0.550 07/21/2022    06/07/20 MRI lumbar spine  -Mild degenerative changes at L4-5 and L5-S1  04/06/21 MRI cervical spine  1. Patchy increased T2 signal mostly within the dorsal cervical cord from C3 to C6-C7, but also involving the right and left hemicords at C5-C6. Some but not all of these areas demonstrate mild enhancement. Differential considerations include transverse myelitis, ADEM, and subacute combined degeneration. 2. Multilevel cervical spondylosis as described above. Severe neuroforaminal stenosis on the left at C3-C4 bilaterally at C4-C5.   04/06/21 MRI thoracic spine  1. Normal MRI of the thoracic spine.  12/11/23 MRI brain (with and without) [I reviewed images myself and agree with interpretation. -VRP]  1. No acute intracranial abnormality. 2. Patchy T2/FLAIR hyperintensity involving the supratentorial cerebral white matter, nonspecific, but overall mild to moderate in nature, and mildly progressed as compared to previous MRI from 04/06/2021. Findings are again nonspecific, but favored to reflect sequelae of chronic microvascular ischemic disease. Sequelae of migrainous disorder or prior infectious or inflammatory process would be the primary differential considerations. Possible demyelinating disease is not entirely excluded, but appearance would not be typical for this etiology. No abnormal enhancement.  12/11/23 MRI cervical spine (with and without)  1. Patchy signal abnormality involving  the dorsal cord, extending from C3 through C6-7, relatively stable in distribution from prior. The cord appears somewhat diffusely atrophic through this region, with no associated enhancement seen on today's exam. Findings are consistent with a prior insult, with sequelae of prior transverse myelitis, subacute combined degeneration, or other demyelinating process/ADEM the primary considerations. 2. Multilevel cervical spondylosis without significant spinal stenosis. Associated moderate to severe bilateral C4 through C8 foraminal narrowing as above.  08/02/24     Component Ref Range & Units (hover) 3 mo ago  NMO IgG Autoantibodies <1.5     Component Ref Range & Units (hover) 3 mo ago  Ceruloplasmin 25.3      Component Ref Range & Units (hover) 3 mo ago  HTLV I/II Ab Negative     Component Ref Range & Units (hover) 3 mo ago  MOG Antibody, Cell-based IFA Negative    08/24/24 CSF    Component Ref Range & Units (hover) 2 mo ago  Oligo Bands Absent  Comment: . TWO identical (mirror image) gamma restriction bands are observed in the patient's CSF and serum sample. This finding is indicative of systemic rather than intra-cerebral synthesis of gamma globulins.     Component Ref Range & Units (hover) 2 mo ago (08/24/24) 3 mo ago (08/02/24) 3 yr ago (07/15/21) 3 yr ago (04/06/21)  CNS-IgG Synthesis Rate -3.2     IgG-Index 0.47     Comment: . The IgG Synthesis rate, CSF and IgG index, CSF are two  formulae for estimating the amount of IgG produced in the central nervous system.  Evidence of increased synthesis of IgG provides support for the diagnosis of multiple sclerosis. .  Albumin, CSF 24.6     IgG Total CSF 2.5     IgG (Immunoglobin G), Serum 1,030 1,176 R 974 895 R  Albumin Serum 4.8      Component Ref Range & Units (hover) 2 mo ago (08/24/24) 3 yr ago (04/06/21) 3 yr ago (04/06/21)  Color, CSF COLORLESS COLORLESS COLORLESS  Appearance, CSF CLEAR CLEAR  CLEAR  RBC Count, CSF 0 0 R 4 High  R  TOTAL NUCLEATED CELL 2       ASSESSMENT AND PLAN  54 y.o. year old male here with:  Dx:  1. Transverse myelitis (HCC)   2. Numbness and tingling in right hand     PLAN:  TRANSVERSE MYELITIS (since ~2020; one time sudden onset attack; CSF and lab testing negative; extensive workup completed; could represent idiopathic transverse myelitis vs spinal cord infarction) - check B12 level and MRI cervical spine w/wo (due to progression of symptoms per patient) - use cane as needed; consider PT / OT evaluation - continue pregabalin ; will increase as tolerated - try to stop smoking; continue BP control - start aspirin 81mg  daily - follow up with PCP re: diabetes and lipid screening  Orders Placed This Encounter  Procedures   MR CERVICAL SPINE W WO CONTRAST   Vitamin B12   Ambulatory referral to Physical Medicine Rehab   Meds ordered this encounter  Medications   pregabalin  (LYRICA ) 75 MG capsule    Sig: Take 1 capsule (75 mg total) by mouth 2 (two) times daily.    Dispense:  60 capsule    Refill:  5   cyclobenzaprine  (FLEXERIL ) 5 MG tablet    Sig: Take 1-2 tablets (5-10 mg total) by mouth every 8 (eight) hours as needed for muscle spasms.    Dispense:  30 tablet    Refill:  1   Return in about 6 months (around 05/15/2025).     EDUARD FABIENE HANLON, MD 11/15/2024, 4:04 PM Certified in Neurology, Neurophysiology and Neuroimaging  [facility] [address], [address] [place], [place] [address] [phone]  "

## 2024-11-15 NOTE — Patient Instructions (Signed)
 TRANSVERSE MYELITIS (since ~2020; one time attack; CSF and lab testing negative; extensive workup completed; could represent idiopathic transverse myelitis vs spinal cord infarction) - use cane as needed; consider PT / OT evaluation - continue pregabalin ; will increase as tolerated - try to stop smoking; continue BP control - start aspirin 81mg  daily - follow up with PCP re: diabetes and lipid screening

## 2024-11-15 NOTE — Telephone Encounter (Signed)
MRI order sent to [place] [phone]

## 2024-11-16 ENCOUNTER — Ambulatory Visit: Payer: Self-pay | Admitting: Diagnostic Neuroimaging

## 2024-11-16 LAB — VITAMIN B12: Vitamin B-12: 490 pg/mL (ref 232–1245)

## 2024-12-25 ENCOUNTER — Encounter: Payer: Self-pay | Admitting: Nurse Practitioner

## 2025-06-04 ENCOUNTER — Ambulatory Visit: Payer: Medicare (Managed Care) | Admitting: Diagnostic Neuroimaging
